# Patient Record
Sex: Female | Born: 1950 | Race: White | Hispanic: No | Marital: Married | Smoking: Never smoker
Health system: Southern US, Community
[De-identification: ages and names within clinical notes are randomized; demographics above are authoritative.]

## PROBLEM LIST (undated history)

## (undated) DIAGNOSIS — K635 Polyp of colon: Secondary | ICD-10-CM

## (undated) DIAGNOSIS — H269 Unspecified cataract: Secondary | ICD-10-CM

## (undated) DIAGNOSIS — Z800 Family history of malignant neoplasm of digestive organs: Secondary | ICD-10-CM

## (undated) DIAGNOSIS — F32A Depression, unspecified: Secondary | ICD-10-CM

## (undated) DIAGNOSIS — N189 Chronic kidney disease, unspecified: Secondary | ICD-10-CM

## (undated) DIAGNOSIS — N952 Postmenopausal atrophic vaginitis: Secondary | ICD-10-CM

## (undated) DIAGNOSIS — M1990 Unspecified osteoarthritis, unspecified site: Secondary | ICD-10-CM

## (undated) DIAGNOSIS — E785 Hyperlipidemia, unspecified: Secondary | ICD-10-CM

## (undated) DIAGNOSIS — Z803 Family history of malignant neoplasm of breast: Secondary | ICD-10-CM

## (undated) DIAGNOSIS — R32 Unspecified urinary incontinence: Secondary | ICD-10-CM

## (undated) DIAGNOSIS — G4700 Insomnia, unspecified: Secondary | ICD-10-CM

## (undated) DIAGNOSIS — Z807 Family history of other malignant neoplasms of lymphoid, hematopoietic and related tissues: Secondary | ICD-10-CM

## (undated) DIAGNOSIS — F419 Anxiety disorder, unspecified: Secondary | ICD-10-CM

## (undated) DIAGNOSIS — R3129 Other microscopic hematuria: Secondary | ICD-10-CM

## (undated) DIAGNOSIS — Z8051 Family history of malignant neoplasm of kidney: Secondary | ICD-10-CM

## (undated) DIAGNOSIS — F329 Major depressive disorder, single episode, unspecified: Secondary | ICD-10-CM

## (undated) DIAGNOSIS — Z8042 Family history of malignant neoplasm of prostate: Secondary | ICD-10-CM

## (undated) DIAGNOSIS — K219 Gastro-esophageal reflux disease without esophagitis: Secondary | ICD-10-CM

## (undated) DIAGNOSIS — R112 Nausea with vomiting, unspecified: Secondary | ICD-10-CM

## (undated) DIAGNOSIS — D125 Benign neoplasm of sigmoid colon: Secondary | ICD-10-CM

## (undated) DIAGNOSIS — K52832 Lymphocytic colitis: Secondary | ICD-10-CM

## (undated) DIAGNOSIS — Z9889 Other specified postprocedural states: Secondary | ICD-10-CM

## (undated) DIAGNOSIS — K5730 Diverticulosis of large intestine without perforation or abscess without bleeding: Secondary | ICD-10-CM

## (undated) HISTORY — DX: Family history of other malignant neoplasms of lymphoid, hematopoietic and related tissues: Z80.7

## (undated) HISTORY — DX: Diverticulosis of large intestine without perforation or abscess without bleeding: K57.30

## (undated) HISTORY — PX: EYE SURGERY: SHX253

## (undated) HISTORY — DX: Anxiety disorder, unspecified: F41.9

## (undated) HISTORY — DX: Family history of malignant neoplasm of breast: Z80.3

## (undated) HISTORY — DX: Unspecified urinary incontinence: R32

## (undated) HISTORY — DX: Family history of malignant neoplasm of kidney: Z80.51

## (undated) HISTORY — DX: Family history of malignant neoplasm of prostate: Z80.42

## (undated) HISTORY — DX: Unspecified cataract: H26.9

## (undated) HISTORY — DX: Depression, unspecified: F32.A

## (undated) HISTORY — DX: Major depressive disorder, single episode, unspecified: F32.9

## (undated) HISTORY — DX: Gastro-esophageal reflux disease without esophagitis: K21.9

## (undated) HISTORY — DX: Polyp of colon: K63.5

## (undated) HISTORY — DX: Postmenopausal atrophic vaginitis: N95.2

## (undated) HISTORY — DX: Hyperlipidemia, unspecified: E78.5

## (undated) HISTORY — DX: Family history of malignant neoplasm of digestive organs: Z80.0

## (undated) HISTORY — DX: Benign neoplasm of sigmoid colon: D12.5

## (undated) HISTORY — DX: Unspecified osteoarthritis, unspecified site: M19.90

## (undated) HISTORY — DX: Other microscopic hematuria: R31.29

## (undated) HISTORY — DX: Insomnia, unspecified: G47.00

## (undated) HISTORY — DX: Chronic kidney disease, unspecified: N18.9

## (undated) HISTORY — DX: Lymphocytic colitis: K52.832

---

## 1978-07-06 HISTORY — PX: TUBAL LIGATION: SHX77

## 1991-07-07 HISTORY — PX: VAGINAL HYSTERECTOMY: SUR661

## 1999-09-04 ENCOUNTER — Encounter: Payer: Self-pay | Admitting: Internal Medicine

## 1999-09-04 ENCOUNTER — Encounter: Admission: RE | Admit: 1999-09-04 | Discharge: 1999-09-04 | Payer: Self-pay | Admitting: Internal Medicine

## 2000-09-06 ENCOUNTER — Encounter: Admission: RE | Admit: 2000-09-06 | Discharge: 2000-09-06 | Payer: Self-pay | Admitting: Internal Medicine

## 2000-09-06 ENCOUNTER — Encounter: Payer: Self-pay | Admitting: Internal Medicine

## 2001-09-26 ENCOUNTER — Encounter: Payer: Self-pay | Admitting: Unknown Physician Specialty

## 2001-09-26 ENCOUNTER — Encounter: Admission: RE | Admit: 2001-09-26 | Discharge: 2001-09-26 | Payer: Self-pay | Admitting: Unknown Physician Specialty

## 2003-07-07 HISTORY — PX: SHOULDER SURGERY: SHX246

## 2005-07-06 HISTORY — PX: OTHER SURGICAL HISTORY: SHX169

## 2009-08-27 HISTORY — PX: URETHRAL SLING: SHX2621

## 2011-09-24 LAB — HM PAP SMEAR

## 2012-04-27 LAB — LIPID PANEL
CHOLESTEROL, TOTAL: 224
HDL: 58 mg/dL (ref 35–70)
LDL (calc): 143
TRIGLYCERIDES: 114

## 2012-04-27 LAB — CBC
HEMOGLOBIN: 12.7 g/dL
WBC: 5.2
platelet count: 294

## 2012-04-27 LAB — COMPREHENSIVE METABOLIC PANEL
ALK PHOS: 87 U/L
ALT: 20 U/L (ref 7–35)
AST: 19 U/L
Creat: 0.93
Glucose: 89
Potassium: 4.4 mmol/L
Sodium: 143 mmol/L (ref 137–147)
Total Bilirubin: 0.4 mg/dL

## 2012-04-27 LAB — TSH: TSH: 2.76

## 2012-05-06 HISTORY — PX: OTHER SURGICAL HISTORY: SHX169

## 2013-05-15 ENCOUNTER — Telehealth: Payer: Self-pay

## 2013-05-15 NOTE — Telephone Encounter (Signed)
Pt is scheduled to see Dr Reece Agar on 06/07/13 as new pt to establish; pt wants to know if can get mammogram and labs ordered prior to appt; pt will come to appt fasting and pt is not sure if gets screening or diag mammo and will discuss with Dr Reece Agar at appt.

## 2013-05-17 ENCOUNTER — Other Ambulatory Visit: Payer: Self-pay

## 2013-05-17 DIAGNOSIS — Z1231 Encounter for screening mammogram for malignant neoplasm of breast: Secondary | ICD-10-CM

## 2013-05-31 ENCOUNTER — Telehealth: Payer: Self-pay

## 2013-05-31 NOTE — Telephone Encounter (Signed)
Rec'd from [facility] forward 15 pages to Historical Provider MD GI

## 2013-06-05 DIAGNOSIS — R3129 Other microscopic hematuria: Secondary | ICD-10-CM

## 2013-06-05 HISTORY — DX: Other microscopic hematuria: R31.29

## 2013-06-05 HISTORY — PX: COLONOSCOPY: SHX174

## 2013-06-07 ENCOUNTER — Encounter: Payer: Self-pay | Admitting: Family Medicine

## 2013-06-07 ENCOUNTER — Ambulatory Visit (INDEPENDENT_AMBULATORY_CARE_PROVIDER_SITE_OTHER): Payer: 59 | Admitting: Family Medicine

## 2013-06-07 VITALS — BP 124/82 | HR 85 | Temp 98.3°F | Ht 66.0 in | Wt 151.0 lb

## 2013-06-07 DIAGNOSIS — Z23 Encounter for immunization: Secondary | ICD-10-CM

## 2013-06-07 DIAGNOSIS — M810 Age-related osteoporosis without current pathological fracture: Secondary | ICD-10-CM

## 2013-06-07 DIAGNOSIS — M1990 Unspecified osteoarthritis, unspecified site: Secondary | ICD-10-CM

## 2013-06-07 DIAGNOSIS — Z8659 Personal history of other mental and behavioral disorders: Secondary | ICD-10-CM | POA: Insufficient documentation

## 2013-06-07 DIAGNOSIS — F329 Major depressive disorder, single episode, unspecified: Secondary | ICD-10-CM

## 2013-06-07 DIAGNOSIS — F5104 Psychophysiologic insomnia: Secondary | ICD-10-CM | POA: Insufficient documentation

## 2013-06-07 DIAGNOSIS — G4700 Insomnia, unspecified: Secondary | ICD-10-CM

## 2013-06-07 DIAGNOSIS — N951 Menopausal and female climacteric states: Secondary | ICD-10-CM

## 2013-06-07 DIAGNOSIS — E785 Hyperlipidemia, unspecified: Secondary | ICD-10-CM

## 2013-06-07 DIAGNOSIS — R634 Abnormal weight loss: Secondary | ICD-10-CM

## 2013-06-07 DIAGNOSIS — Z809 Family history of malignant neoplasm, unspecified: Secondary | ICD-10-CM

## 2013-06-07 DIAGNOSIS — F32A Depression, unspecified: Secondary | ICD-10-CM

## 2013-06-07 DIAGNOSIS — R3129 Other microscopic hematuria: Secondary | ICD-10-CM

## 2013-06-07 DIAGNOSIS — M129 Arthropathy, unspecified: Secondary | ICD-10-CM

## 2013-06-07 DIAGNOSIS — K52832 Lymphocytic colitis: Secondary | ICD-10-CM

## 2013-06-07 DIAGNOSIS — R197 Diarrhea, unspecified: Secondary | ICD-10-CM

## 2013-06-07 DIAGNOSIS — F418 Other specified anxiety disorders: Secondary | ICD-10-CM | POA: Insufficient documentation

## 2013-06-07 DIAGNOSIS — R319 Hematuria, unspecified: Secondary | ICD-10-CM

## 2013-06-07 DIAGNOSIS — F3289 Other specified depressive episodes: Secondary | ICD-10-CM

## 2013-06-07 DIAGNOSIS — R32 Unspecified urinary incontinence: Secondary | ICD-10-CM

## 2013-06-07 HISTORY — DX: Lymphocytic colitis: K52.832

## 2013-06-07 LAB — POCT URINALYSIS DIPSTICK
Bilirubin, UA: NEGATIVE
Glucose, UA: NEGATIVE
Leukocytes, UA: NEGATIVE
Spec Grav, UA: 1.02
Urobilinogen, UA: 0.2

## 2013-06-07 LAB — CBC WITH DIFFERENTIAL/PLATELET
Basophils Absolute: 0 10*3/uL (ref 0.0–0.1)
Basophils Relative: 0.4 % (ref 0.0–3.0)
Eosinophils Relative: 0.4 % (ref 0.0–5.0)
HCT: 35.6 % — ABNORMAL LOW (ref 36.0–46.0)
Hemoglobin: 12.1 g/dL (ref 12.0–15.0)
Lymphocytes Relative: 32 % (ref 12.0–46.0)
Lymphs Abs: 1.7 10*3/uL (ref 0.7–4.0)
Monocytes Relative: 8 % (ref 3.0–12.0)
Neutro Abs: 3.1 10*3/uL (ref 1.4–7.7)
Neutrophils Relative %: 59.2 % (ref 43.0–77.0)
RBC: 4.01 Mil/uL (ref 3.87–5.11)
WBC: 5.2 10*3/uL (ref 4.5–10.5)

## 2013-06-07 LAB — COMPREHENSIVE METABOLIC PANEL WITH GFR
ALT: 20 U/L (ref 0–35)
AST: 18 U/L (ref 0–37)
Albumin: 3.9 g/dL (ref 3.5–5.2)
Alkaline Phosphatase: 61 U/L (ref 39–117)
BUN: 10 mg/dL (ref 6–23)
CO2: 28 meq/L (ref 19–32)
Calcium: 9.1 mg/dL (ref 8.4–10.5)
Chloride: 105 meq/L (ref 96–112)
Creatinine, Ser: 0.9 mg/dL (ref 0.4–1.2)
GFR: 66.64 mL/min
Glucose, Bld: 84 mg/dL (ref 70–99)
Potassium: 3.4 meq/L — ABNORMAL LOW (ref 3.5–5.1)
Sodium: 140 meq/L (ref 135–145)
Total Bilirubin: 0.8 mg/dL (ref 0.3–1.2)
Total Protein: 7.4 g/dL (ref 6.0–8.3)

## 2013-06-07 LAB — TSH: TSH: 1.75 u[IU]/mL (ref 0.35–5.50)

## 2013-06-07 LAB — URINALYSIS, MICROSCOPIC ONLY

## 2013-06-07 LAB — LIPID PANEL
Cholesterol: 229 mg/dL — ABNORMAL HIGH (ref 0–200)
HDL: 42.8 mg/dL
Total CHOL/HDL Ratio: 5
Triglycerides: 144 mg/dL (ref 0.0–149.0)
VLDL: 28.8 mg/dL (ref 0.0–40.0)

## 2013-06-07 LAB — LDL CHOLESTEROL, DIRECT: Direct LDL: 162.1 mg/dL

## 2013-06-07 MED ORDER — TRAZODONE HCL 50 MG PO TABS: 50.0000 mg | ORAL_TABLET | Freq: Every day | ORAL | Status: DC

## 2013-06-07 NOTE — Patient Instructions (Addendum)
Try to get me record of bone density scan to update your chart. Our fax # is [phone] Try trazodone 1/2 tab at night time for sleep.  If helpful, may slowly taper off lorazepam use.  May also stop melatonin. Blood work today - urine check today and we will call you with results. Return at your convenience in next few months for physical. Good to meet you today! Call us with quesitons. Flu shot today

## 2013-06-07 NOTE — Assessment & Plan Note (Signed)
Currently not sexually active. Discussed options including SERM, estrogen topical and oral and vaginal lubricants/moisturizers. Pt hesitant for any hormonal therapy given strong fmhx cancers.  Will monitor off meds for now, further discuss osphena in future.

## 2013-06-07 NOTE — Assessment & Plan Note (Signed)
Longstanding h/o depression, but also with superimposed significant family health problems. Will continue to monitor for now as pt does not currently desire pharmacotherapy

## 2013-06-07 NOTE — Assessment & Plan Note (Addendum)
See above - check CMP, CBC and TSH

## 2013-06-07 NOTE — Assessment & Plan Note (Signed)
Osteoporosis per prior PCP records but pt unsure. Will await records of last dexa scan. Pt currently not on calcium or vit D.

## 2013-06-07 NOTE — Assessment & Plan Note (Addendum)
Stable on celebrex, trying to minimize use

## 2013-06-07 NOTE — Assessment & Plan Note (Signed)
On melatonin and lorazepam 2mg  nightly, losing effectiveness Will do trial of trazodone 25-50mg  nightly.

## 2013-06-07 NOTE — Assessment & Plan Note (Addendum)
Several month history of progressively worsening diarrhea but pt endorses recently normal colonoscopy.  Also noted weight loss (in setting of recent move) Pt appropriately concerned given fmhx. Pt has appt already scheduled with GI. Will check CMP, CBC and TSH today to start evaluation.

## 2013-06-07 NOTE — Progress Notes (Signed)
Subjective:    Patient ID: Shannon Kelly, female    DOB: Feb 17, 1951, 62 y.o.   MRN: [id number]  HPI CC: new pt to establish  I did receive records from Dr. Dellia Beckwith but no reports of colonoscopy or dexa scan.  Arthritis - knee pain and thoracic back pain.  Takes celebrex prn.  Tries to minimize use. Significant vaginal dryness - hesitant to use any estrogen or hormonal products.  Asks about osphena.  Has tried vaginal lubricants and moisturizers.  Leads to significant dyspareunia and sexual dysfunction.  Diarrhea - has had chronic diarrhea over last several months.  Had seen GI - s/p colonoscopy ~2013.  Pt increased fiber in diet, this has not helped.  Over last 3 weeks, increasing frequency of diarrhea.  Has appt with GI tomorrow to evaluate this. Mother with h/o stage 4 colon cancer at age 17, sudden death. Brother with recent dx pancreatic cancer. Strong fmhx cancer, wonders about genetic test.  Osteoporosis - dexa scan done last few years per patient. Depression - significant family stress recently.  does not want med for this.  Prior on cymbalta but did not respond well to this.  States has tried 15 different medications. OBGYN recommended sleep study.  Pt endorses snoring, no apnea however.  Does feel rested when she awakens.  No daytime somnolence.  Some left lateral back pain worse with physical labor.  No radiation.  Dull throbbing ache. Has lost 12 lbs in last year without trying but has been more active with move.  Significant fmhx cancers - see [facility].  Requests UA checked today.  Has been told has trace blood in urine in past.  Preventative: Due for CPE Colonoscopy - done 2013 per patient [facility] - with OBGYN.  S/p hysterectomy. Flu shot today  Medications and allergies reviewed and updated in chart.  Past histories reviewed and updated if relevant as below. There are no active problems to display for this patient.  Past Medical History  Diagnosis Date  .  Arthritis   . HLD (hyperlipidemia)   . Osteoporosis   . Depression   . Urine incontinence    Past Surgical History  Procedure Laterality Date  . Vaginal hysterectomy  1993    fibroids  . Shoulder surgery Right 2005    Calcium deposit in tendon  . Urethral sling  08/27/2009  . Tubal ligation  1980   History  Substance Use Topics  . Smoking status: Never Smoker   . Smokeless tobacco: Never Used  . Alcohol Use: No   Family History  Problem Relation Age of Onset  . Cancer Mother 30    colon, lymphoma  . Cancer Brother 46    RCC stage 4  . Cancer Brother 92    pancreatic  . Cancer Cousin 48    lung  . Cancer Cousin     breast  . Cancer Maternal Uncle     stomach  . Cancer Maternal Uncle     prostate  . Cancer Cousin     NHL  . Diabetes Brother   . Diabetes Father   . CAD Maternal Uncle   . Stroke Neg Hx    No Known Allergies No current outpatient prescriptions on file prior to visit.   No current facility-administered medications on file prior to visit.    Review of Systems Per HPI    Objective:   Physical Exam  Nursing note and vitals reviewed. Constitutional: She is oriented to person, place, and  time. She appears well-developed and well-nourished. No distress.  HENT:  Head: Normocephalic and atraumatic.  Mouth/Throat: Oropharynx is clear and moist. No oropharyngeal exudate.  Eyes: Conjunctivae and EOM are normal. Pupils are equal, round, and reactive to light. No scleral icterus.  Neck: Normal range of motion. Neck supple. No thyromegaly present.  Cardiovascular: Normal rate, regular rhythm, normal heart sounds and intact distal pulses.   No murmur heard. Pulses:      Radial pulses are 2+ on the right side, and 2+ on the left side.  Pulmonary/Chest: Effort normal and breath sounds normal. No respiratory distress. She has no wheezes. She has no rales.  Abdominal: Soft. Normal appearance and bowel sounds are normal. She exhibits no distension and no mass.  There is no hepatosplenomegaly. There is no tenderness. There is no rigidity, no rebound, no guarding, no CVA tenderness and negative Murphy's sign.  No reproducible tenderness to bilateral flanks.  Musculoskeletal: Normal range of motion. She exhibits no edema.  Lymphadenopathy:    She has no cervical adenopathy.  Neurological: She is alert and oriented to person, place, and time.  CN grossly intact, station and gait intact  Skin: Skin is warm and dry. No rash noted.  Psychiatric: She has a normal mood and affect. Her behavior is normal. Judgment and thought content normal.       Assessment & Plan:

## 2013-06-07 NOTE — Progress Notes (Signed)
Pre-visit discussion using our clinic review tool. No additional management support is needed unless otherwise documented below in the visit note.  

## 2013-06-07 NOTE — Assessment & Plan Note (Signed)
Mildly elevated, off meds. Recheck today as fasting.

## 2013-06-08 ENCOUNTER — Encounter: Payer: Self-pay | Admitting: Internal Medicine

## 2013-06-08 ENCOUNTER — Ambulatory Visit (INDEPENDENT_AMBULATORY_CARE_PROVIDER_SITE_OTHER): Payer: 59 | Admitting: Internal Medicine

## 2013-06-08 VITALS — BP 136/80 | HR 60 | Ht 66.0 in | Wt 152.8 lb

## 2013-06-08 DIAGNOSIS — R197 Diarrhea, unspecified: Secondary | ICD-10-CM

## 2013-06-08 MED ORDER — NA SULFATE-K SULFATE-MG SULF 17.5-3.13-1.6 GM/177ML PO SOLN: ORAL | Status: DC

## 2013-06-08 NOTE — Patient Instructions (Addendum)
You have been scheduled for a colonoscopy with propofol. Please follow written instructions given to you at your visit today.  Please pick up your prep kit at the pharmacy within the next 1-3 days. If you use inhalers (even only as needed), please bring them with you on the day of your procedure. Your physician has requested that you go to www.startemmi.com and enter the access code given to you at your visit today. This web site gives a general overview about your procedure. However, you should still follow specific instructions given to you by our office regarding your preparation for the procedure.  Your physician has requested that you go to the basement for the following lab work before leaving today: Stool studies, you may drop these off at [facility].  You may use the Immodium as needed for the diarrhea.  I appreciate the opportunity to care for you.

## 2013-06-08 NOTE — Assessment & Plan Note (Signed)
This is now chronic I would say. Causes not clear. I asked she has not had travel or recent antibiotics but C. differential is in the differential diagnosis as is other infections. We'll have her do stool for fecal leukocytes, ova and parasite screen, C. differential PCR and culture. Will also schedule her for a colonoscopy. If these tests come back positive will cancel the colonoscopy.The risks and benefits as well as alternatives of endoscopic procedure(s) have been discussed and reviewed. All questions answered. The patient agrees to proceed.

## 2013-06-08 NOTE — Progress Notes (Signed)
Subjective:    Patient ID: Shannon Kelly, female    DOB: 07/29/1950, 62 y.o.   MRN: [id number]  HPI The patient is a very pleasant married 62 year old white woman who self referred herself because of diarrhea problems over the past month. She says in the last year or so she started having soft stools. That in the past 4-5 weeks she started having loose watery stools that are urgent and she's had one episode of verge incontinence. They bother her while she is trying to sleep. Loperamide does provide some relief. There is no abdominal pain. She's had a left flank pain that bothers her when she's doing something like mowing the ER and was sitting against a church P2 for several months, it is not related to defecation or eating. She moved here from [place], just in the last month or so. She has regular colonoscopy procedures every 5 years because her mother had colon cancer and died soon after, it was diagnosed at 24, she also had a lymphoma diagnosed at the time of her operation. She has been under stress because her brother was diagnosed with metastatic pancreatic cancer this summer. She does not think her symptoms are really related to that. Intake or whether or not she eats. She has not had fever or bleeding. she has lost about 10 pounds. No Known Allergies Outpatient Prescriptions Prior to Visit  Medication Sig Dispense Refill  . celecoxib (CELEBREX) 200 MG capsule Take 200 mg by mouth daily as needed.       Marland Kitchen LORazepam (ATIVAN) 1 MG tablet Take 1 mg by mouth at bedtime.       . Melatonin 10 MG TABS Take 1 tablet by mouth at bedtime.      . traZODone (DESYREL) 50 MG tablet Take 1 tablet (50 mg total) by mouth at bedtime.  30 tablet  3  . loperamide (IMODIUM) 2 MG capsule Take 1 mg by mouth as needed for diarrhea or loose stools.      . Olopatadine HCl (PATADAY) 0.2 % SOLN Apply to eye as needed.       No facility-administered medications prior to visit.   Past Medical History    Diagnosis Date  . Arthritis   . HLD (hyperlipidemia)   . Osteoporosis   . Depression   . Urine incontinence   . Polyp, sigmoid colon     hyperplastic  . Diverticula of colon     sigmoid   Past Surgical History  Procedure Laterality Date  . Vaginal hysterectomy  1993    fibroids  . Shoulder surgery Right 2005    Calcium deposit in tendon  . Urethral sling  08/27/2009  . Tubal ligation  1980  . Colonoscopy w/ polypectomy  2013  . Colonoscopy  2008  . 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 Marital Status: Married    Spouse Name: N/A    Number of Children: 1  . Years of Education: N/A   Occupational History  . Retired    Social History Main Topics  . Smoking status: Never Smoker   . Smokeless tobacco: Never Used  . Alcohol Use: No     Comment: occasional  . Drug Use: No  . Sexual Activity: None   Other Topics Concern  . None   Social History Narrative   Married, 1 son  Had been living in [place], return to [facility] fall 2014   Occupation: Retired, prior worked at [employer]   Edu: HS   Family History  Problem Relation Age of Onset  . Cancer Mother 80    colon, lymphoma  . Cancer Brother 31    RCC stage 4  . Cancer Brother 70    pancreatic  . Cancer Cousin 48    lung  . Cancer Cousin     breast  . Cancer Maternal Uncle     stomach  . Cancer Maternal Uncle     prostate  . Cancer Cousin     NHL  . Diabetes Brother   . Diabetes Father   . CAD Maternal Uncle   . Stroke Neg Hx     Review of Systems This is positive for those things mentiones in the HPI, also positive for insomnia, excessive thirst and dry mouth. She has cataracts and has surgery planned for 07/12/2013. She's had some depressed mood, fatigue back pain joint pain and some anxiety symptoms.. All other review of systems are negative.      Objective:   Physical Exam General:  Well-developed,  well-nourished and in no acute distress Eyes:  anicteric. ENT:   Mouth and posterior pharynx free of lesions.  Neck:   supple w/o thyromegaly or mass.  Lungs: Clear to auscultation bilaterally. Heart:  S1S2, no rubs, murmurs, gallops. Abdomen:  soft, non-tender, no hepatosplenomegaly, hernia, or mass and BS+.  Rectal: deferred Lymph:  no cervical or supraclavicular adenopathy. Extremities:   no edema Skin   no rash. Neuro:  A&O x 3.  Psych:  appropriate mood and  Affect.   Data Reviewed: Previous colonoscopies Lab Results  Component Value Date   WBC 5.2 06/07/2013   HGB 12.1 06/07/2013   HCT 35.6* 06/07/2013   MCV 88.6 06/07/2013   PLT 296.0 06/07/2013     Chemistry      Component Value Date/Time   NA 140 06/07/2013 1051   K 3.4* 06/07/2013 1051   CL 105 06/07/2013 1051   CO2 28 06/07/2013 1051   BUN 10 06/07/2013 1051   CREATININE 0.9 06/07/2013 1051      Component Value Date/Time   CALCIUM 9.1 06/07/2013 1051   ALKPHOS 61 06/07/2013 1051   AST 18 06/07/2013 1051   ALT 20 06/07/2013 1051   BILITOT 0.8 06/07/2013 1051     Lab Results  Component Value Date   TSH 1.75 06/07/2013      Assessment & Plan:   1. Diarrhea

## 2013-06-08 NOTE — Addendum Note (Signed)
Addended by: Iva Boop on: 06/08/2013 04:37 PM   Modules accepted: Level of Service

## 2013-06-09 NOTE — Addendum Note (Signed)
Addended by: Alvina Chou on: 06/09/2013 08:39 AM   Modules accepted: Orders

## 2013-06-11 DIAGNOSIS — R3129 Other microscopic hematuria: Secondary | ICD-10-CM | POA: Insufficient documentation

## 2013-06-11 NOTE — Assessment & Plan Note (Signed)
UA today mod blood, but micro with 0-3 RBC - sent micro to lab to verify. 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  ADDENDUM ==> micro with 3-6  RBC/hpf - will discuss referral to urology

## 2013-06-12 ENCOUNTER — Other Ambulatory Visit: Payer: Self-pay | Admitting: Family Medicine

## 2013-06-12 ENCOUNTER — Telehealth: Payer: Self-pay

## 2013-06-12 ENCOUNTER — Encounter: Payer: Self-pay | Admitting: Family Medicine

## 2013-06-12 DIAGNOSIS — R3129 Other microscopic hematuria: Secondary | ICD-10-CM

## 2013-06-12 DIAGNOSIS — R634 Abnormal weight loss: Secondary | ICD-10-CM

## 2013-06-12 LAB — OVA AND PARASITE SCREEN: OP: NONE SEEN

## 2013-06-12 LAB — CLOSTRIDIUM DIFFICILE BY PCR: Toxigenic C. Difficile by PCR: NOT DETECTED

## 2013-06-12 NOTE — Telephone Encounter (Signed)
Told patient that C-Diff test is still pending, we should get a called report shortly after 2pm according to Viviann Spare at [facility] and I will call her back on her mobile # as she will be out.  She informed me of a new rx her PCP has put her on for sleep (trazodone) that I will add to her list.  She thinks it may be constipating her.

## 2013-06-12 NOTE — Telephone Encounter (Signed)
Per Rudell Cobb at [facility] patients C-Diff test is Negative.  Patient informed of the results and will prep tonight for her colonoscopy.

## 2013-06-13 ENCOUNTER — Encounter: Payer: Self-pay | Admitting: Internal Medicine

## 2013-06-13 ENCOUNTER — Ambulatory Visit (AMBULATORY_SURGERY_CENTER): Payer: 59 | Admitting: Internal Medicine

## 2013-06-13 VITALS — BP 121/72 | HR 63 | Temp 96.5°F | Resp 13 | Ht 66.0 in | Wt 152.0 lb

## 2013-06-13 DIAGNOSIS — R197 Diarrhea, unspecified: Secondary | ICD-10-CM

## 2013-06-13 DIAGNOSIS — K5289 Other specified noninfective gastroenteritis and colitis: Secondary | ICD-10-CM

## 2013-06-13 LAB — STOOL CULTURE

## 2013-06-13 MED ORDER — SODIUM CHLORIDE 0.9 % IV SOLN: 500.0000 mL | INTRAVENOUS | Status: DC

## 2013-06-13 NOTE — Progress Notes (Signed)
Lidocaine-40mg IV prior to Propofol InductionPropofol given over incremental dosages 

## 2013-06-13 NOTE — Op Note (Signed)
[facility] [address] [place], [address]   COLONOSCOPY PROCEDURE REPORT  PATIENT: Shannon, Kelly  MR#: [id number] BIRTHDATE: Nov 27, 1950 , 62  yrs. old GENDER: Female ENDOSCOPIST: Iva Boop, MD, [facility] PROCEDURE DATE:  06/13/2013 PROCEDURE:   Colonoscopy with biopsy First Screening Colonoscopy - Avg.  risk and is 50 yrs.  old or older - No.  Prior Negative Screening - Now for repeat screening. N/A  History of Adenoma - Now for follow-up colonoscopy & has been > or = to 3 yrs.  N/A  Polyps Removed Today? No.  Recommend repeat exam, <10 yrs? Yes.  High risk (family or personal hx). ASA CLASS:   Class II INDICATIONS:unexplained diarrhea. MEDICATIONS: propofol (Diprivan) 150mg  IV, MAC sedation, administered by CRNA, and These medications were titrated to patient response per physician's verbal order  DESCRIPTION OF PROCEDURE:   After the risks benefits and alternatives of the procedure were thoroughly explained, informed consent was obtained.  A digital rectal exam revealed no abnormalities of the rectum.   The LB [id number]  endoscope was introduced through the anus and advanced to the cecum, which was identified by both the appendix and ileocecal valve. No adverse events experienced.   The quality of the prep was excellent using Suprep  The instrument was then slowly withdrawn as the colon was fully examined.   COLON FINDINGS: The mucosa appeared normal in the terminal ileum. A normal appearing cecum, ileocecal valve, and appendiceal orifice were identified.  The ascending, hepatic flexure, transverse, splenic flexure, descending, sigmoid colon and rectum appeared unremarkable.  No polyps or cancers were seen.  Multiple random biopsies of the area were performed.   A right colon retroflexion was performed.  Retroflexed views revealed no abnormalities. The time to cecum=1 minutes 55 seconds.  Withdrawal time=9 minutes 0 seconds.  The scope was  withdrawn and the procedure completed. COMPLICATIONS: There were no complications.  ENDOSCOPIC IMPRESSION: 1.   Normal mucosa in the terminal ileum 2.   Normal colon; multiple random biopsies of the area were performed - will call results  RECOMMENDATIONS: 1.  Repeat Colonoscopy in 5 years. 2.   family history of colon cancer   eSigned:  Iva Boop, MD, [facility] 06/13/2013 3:03 PM  cc: The Patient and Eustaquio Boyden MD

## 2013-06-13 NOTE — Patient Instructions (Addendum)
I did not see any problems but did take colon biopsies to look for microscopic changes in the colon lining that could tell us why you have diarrhea.  I suggest trying a 1/4 tablet of the trazodone if 1/2 tablet caused constipation.  I appreciate the opportunity to care for you. Iva Boop, MD, [facility]   You may resume your other current medications today. Please call if any questions or concerns.    YOU HAD AN ENDOSCOPIC PROCEDURE TODAY AT THE [facility]: Refer to the procedure report that was given to you for any specific questions about what was found during the examination.  If the procedure report does not answer your questions, please call your gastroenterologist to clarify.  If you requested that your care partner not be given the details of your procedure findings, then the procedure report has been included in a sealed envelope for you to review at your convenience later.  YOU SHOULD EXPECT: Some feelings of bloating in the abdomen. Passage of more gas than usual.  Walking can help get rid of the air that was put into your GI tract during the procedure and reduce the bloating. If you had a lower endoscopy (such as a colonoscopy or flexible sigmoidoscopy) you may notice spotting of blood in your stool or on the toilet paper. If you underwent a bowel prep for your procedure, then you may not have a normal bowel movement for a few days.  DIET: Your first meal following the procedure should be a light meal and then it is ok to progress to your normal diet.  A half-sandwich or bowl of soup is an example of a good first meal.  Heavy or fried foods are harder to digest and may make you feel nauseous or bloated.  Likewise meals heavy in dairy and vegetables can cause extra gas to form and this can also increase the bloating.  Drink plenty of fluids but you should avoid alcoholic beverages for 24 hours.  ACTIVITY: Your care partner should take you home directly after the procedure.   You should plan to take it easy, moving slowly for the rest of the day.  You can resume normal activity the day after the procedure however you should NOT DRIVE or use heavy machinery for 24 hours (because of the sedation medicines used during the test).    SYMPTOMS TO REPORT IMMEDIATELY: A gastroenterologist can be reached at any hour.  During normal business hours, 8:30 AM to 5:00 PM Monday through Friday, call [phone].  After hours and on weekends, please call the GI answering service at [phone] who will take a message and have the physician on call contact you.   Following lower endoscopy (colonoscopy or flexible sigmoidoscopy):  Excessive amounts of blood in the stool  Significant tenderness or worsening of abdominal pains  Swelling of the abdomen that is new, acute  Fever of 100F or higher    FOLLOW UP: If any biopsies were taken you will be contacted by phone or by letter within the next 1-3 weeks.  Call your gastroenterologist if you have not heard about the biopsies in 3 weeks.  Our staff will call the home number listed on your records the next business day following your procedure to check on you and address any questions or concerns that you may have at that time regarding the information given to you following your procedure. This is a courtesy call and so if there is no answer at the  home number and we have not heard from you through the emergency physician on call, we will assume that you have returned to your regular daily activities without incident.  SIGNATURES/CONFIDENTIALITY: You and/or your care partner have signed paperwork which will be entered into your electronic medical record.  These signatures attest to the fact that that the information above on your After Visit Summary has been reviewed and is understood.  Full responsibility of the confidentiality of this discharge information lies with you and/or your care-partner.

## 2013-06-13 NOTE — Progress Notes (Signed)
Called to room to assist during endoscopic procedure.  Patient ID and intended procedure confirmed with present staff. Received instructions for my participation in the procedure from the performing physician.  

## 2013-06-13 NOTE — Progress Notes (Signed)
No complaints noted in the recovery room. Maw   

## 2013-06-14 ENCOUNTER — Telehealth: Payer: Self-pay

## 2013-06-14 NOTE — Telephone Encounter (Signed)
  Follow up Call-  Call back number 06/13/2013  Post procedure Call Back phone  # [phone]-HOME/ CELL [phone]  Permission to leave phone message Yes     Patient questions:  Do you have a fever, pain , or abdominal swelling? no Pain Score  0 *  Have you tolerated food without any problems? yes  Have you been able to return to your normal activities? yes  Do you have any questions about your discharge instructions: Diet   no Medications  no Follow up visit  no  Do you have questions or concerns about your Care? no  Actions: * If pain score is 4 or above: No action needed, pain <4.

## 2013-06-19 ENCOUNTER — Ambulatory Visit
Admission: RE | Admit: 2013-06-19 | Discharge: 2013-06-19 | Disposition: A | Discharge status: Home / Self Care | Payer: 59 | Source: Ambulatory Visit

## 2013-06-19 ENCOUNTER — Ambulatory Visit (INDEPENDENT_AMBULATORY_CARE_PROVIDER_SITE_OTHER)
Admission: RE | Admit: 2013-06-19 | Discharge: 2013-06-19 | Disposition: A | Discharge status: Home / Self Care | Payer: 59 | Source: Ambulatory Visit | Attending: Family Medicine | Admitting: Family Medicine

## 2013-06-19 DIAGNOSIS — R634 Abnormal weight loss: Secondary | ICD-10-CM

## 2013-06-19 DIAGNOSIS — Z1231 Encounter for screening mammogram for malignant neoplasm of breast: Secondary | ICD-10-CM

## 2013-06-19 DIAGNOSIS — R3129 Other microscopic hematuria: Secondary | ICD-10-CM

## 2013-06-19 MED ORDER — IOHEXOL 350 MG/ML SOLN
125.0000 mL | Freq: Once | INTRAVENOUS | Status: AC | PRN
  Administered 2013-06-19: 125 mL via INTRAVENOUS

## 2013-06-20 ENCOUNTER — Encounter: Payer: Self-pay | Admitting: Internal Medicine

## 2013-06-20 LAB — HM MAMMOGRAPHY: HM Mammogram: NORMAL

## 2013-06-20 NOTE — Progress Notes (Signed)
Quick Note:  Please let her know that she has a microscopic colitis and needs treatment to stop the sxs I recommend budesonide (Entocort EC) 9 mg daily - 3 mg capsules #90 1 refill Please have her see me in 4-6 weeks  If the Entocort EC is too expensive then I will rx prednisone instead  No letter from [facility] but needs 06/2018 colonoscopy recall ______

## 2013-06-21 ENCOUNTER — Encounter: Payer: Self-pay | Admitting: Family Medicine

## 2013-06-21 ENCOUNTER — Other Ambulatory Visit: Payer: Self-pay

## 2013-06-21 MED ORDER — BUDESONIDE 3 MG PO CP24: 9.0000 mg | ORAL_CAPSULE | Freq: Every day | ORAL | Status: DC

## 2013-06-21 NOTE — Addendum Note (Signed)
Addended by: Josph Macho A on: 06/21/2013 03:49 PM   Modules accepted: Orders

## 2013-06-22 ENCOUNTER — Encounter: Payer: Self-pay | Admitting: *Deleted

## 2013-06-22 ENCOUNTER — Encounter: Payer: Self-pay | Admitting: Family Medicine

## 2013-06-23 ENCOUNTER — Encounter: Payer: Self-pay | Admitting: Family Medicine

## 2013-07-11 ENCOUNTER — Encounter: Payer: Self-pay | Admitting: *Deleted

## 2013-07-12 ENCOUNTER — Ambulatory Visit: Payer: Self-pay | Admitting: Ophthalmology

## 2013-07-12 HISTORY — PX: CATARACT EXTRACTION W/ INTRAOCULAR LENS IMPLANT: SHX1309

## 2013-07-16 ENCOUNTER — Encounter: Payer: Self-pay | Admitting: Family Medicine

## 2013-08-01 ENCOUNTER — Ambulatory Visit (INDEPENDENT_AMBULATORY_CARE_PROVIDER_SITE_OTHER): Payer: 59 | Admitting: Internal Medicine

## 2013-08-01 ENCOUNTER — Encounter: Payer: Self-pay | Admitting: Internal Medicine

## 2013-08-01 ENCOUNTER — Other Ambulatory Visit (INDEPENDENT_AMBULATORY_CARE_PROVIDER_SITE_OTHER): Payer: 59

## 2013-08-01 VITALS — BP 120/66 | HR 70 | Ht 66.0 in | Wt 151.2 lb

## 2013-08-01 DIAGNOSIS — K52832 Lymphocytic colitis: Secondary | ICD-10-CM

## 2013-08-01 DIAGNOSIS — R197 Diarrhea, unspecified: Secondary | ICD-10-CM

## 2013-08-01 DIAGNOSIS — K5289 Other specified noninfective gastroenteritis and colitis: Secondary | ICD-10-CM

## 2013-08-01 LAB — IGA: IgA: 193 mg/dL (ref 68–378)

## 2013-08-01 MED ORDER — LOPERAMIDE HCL 2 MG PO TABS: 2.0000 mg | ORAL_TABLET | Freq: Four times a day (QID) | ORAL | Status: DC | PRN

## 2013-08-01 NOTE — Progress Notes (Signed)
         Subjective:    Patient ID: Shannon Kelly, female    DOB: 1951/06/06, 63 y.o.   MRN: [id number]  HPI The patient is here for followup after she was diagnosed with lymphocytic colitis at colonoscopy in December. She started budesonide 9 mg daily on December 17. She says in December she noted marked improvement had normal bowel movements. After the holidays she started having soft stools again today she's had 3 episodes of very loose diarrheal stools. She is not describing abdominal pain or other problems. She feels like her stress level is somewhat better.  Medications, allergies, past medical history, past surgical history, family history and social history are reviewed and updated in the EMR.   Review of Systems As per history of present illness, she also had right cataract extraction she's actually tapering off some steroid eyedrops, she was on an analgesic eyedrops and an antibiotic eyedrops which she has finished.    Objective:   Physical Exam Well-developed well-nourished no acute distress  Wt Readings from Last 3 Encounters:  08/01/13 151 lb 3.2 oz (68.584 kg)  06/13/13 152 lb (68.947 kg)  06/08/13 152 lb 12.8 oz (69.31 kg)      Assessment & Plan:   1. Lymphocytic colitis   2. Diarrhea

## 2013-08-01 NOTE — Patient Instructions (Signed)
Your physician has requested that you go to the basement for the following lab work before leaving today: TTG, IGA  We will be in touch with results and plans.   Use Imodium as needed.    I appreciate the opportunity to care for you.

## 2013-08-01 NOTE — Assessment & Plan Note (Signed)
She initially seemed to respond to budesonide or Entocort. Now she is slightly back towards her original symptoms. 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 It also could indicate she is at higher risk of celiac disease. I'm going to check a tissue transglutaminase antibody and IgA level. Once that returned lavage on other treatment, she will continue Entocort EC 9 mg daily for now. I advised her not to do any more prescriptions, she does not have any refills.  Think the possibilities are this is not lymphocytic colitis, versus it been refractory period await the tissue transglutaminase antibody and IgA level. Consider something like cholestyramine or colestipol therapy. In the meantime she can use loperamide as needed.

## 2013-08-02 LAB — TISSUE TRANSGLUTAMINASE, IGA: Tissue Transglutaminase Ab, IgA: 5.5 U/mL (ref <20)

## 2013-08-03 NOTE — Progress Notes (Signed)
Quick Note:  Testing is negative for celiac disease (gluten allergy) Start Colestipol 5 g packet of granules or 1 scoop if can with supper Call back in 2 weeks with an update re: sxs ______

## 2013-08-04 ENCOUNTER — Other Ambulatory Visit: Payer: Self-pay

## 2013-08-04 MED ORDER — COLESTIPOL HCL 5 G PO PACK: 5.0000 g | PACK | Freq: Every day | ORAL | Status: DC

## 2013-08-09 ENCOUNTER — Encounter: Payer: 59 | Admitting: Family Medicine

## 2013-08-11 ENCOUNTER — Encounter: Payer: Self-pay | Admitting: Family Medicine

## 2013-08-11 ENCOUNTER — Ambulatory Visit (INDEPENDENT_AMBULATORY_CARE_PROVIDER_SITE_OTHER): Payer: 59 | Admitting: Family Medicine

## 2013-08-11 VITALS — BP 138/86 | HR 76 | Temp 98.5°F | Ht 66.0 in | Wt 151.5 lb

## 2013-08-11 DIAGNOSIS — Z0001 Encounter for general adult medical examination with abnormal findings: Secondary | ICD-10-CM | POA: Insufficient documentation

## 2013-08-11 DIAGNOSIS — Z0000 Encounter for general adult medical examination without abnormal findings: Secondary | ICD-10-CM | POA: Insufficient documentation

## 2013-08-11 DIAGNOSIS — R252 Cramp and spasm: Secondary | ICD-10-CM | POA: Insufficient documentation

## 2013-08-11 DIAGNOSIS — Z23 Encounter for immunization: Secondary | ICD-10-CM

## 2013-08-11 DIAGNOSIS — K52832 Lymphocytic colitis: Secondary | ICD-10-CM

## 2013-08-11 DIAGNOSIS — G4762 Sleep related leg cramps: Secondary | ICD-10-CM | POA: Insufficient documentation

## 2013-08-11 DIAGNOSIS — R3129 Other microscopic hematuria: Secondary | ICD-10-CM

## 2013-08-11 DIAGNOSIS — E785 Hyperlipidemia, unspecified: Secondary | ICD-10-CM

## 2013-08-11 MED ORDER — LORAZEPAM 1 MG PO TABS: 1.0000 mg | ORAL_TABLET | Freq: Every evening | ORAL | Status: DC | PRN

## 2013-08-11 NOTE — Patient Instructions (Signed)
Tdap today (tetanus and pertussis) Return as needed or in 6-8 months for follow up. Blood work today. Good to see you today, call us with questions.

## 2013-08-11 NOTE — Assessment & Plan Note (Signed)
Preventative protocols reviewed and updated unless pt declined. Discussed healthy diet and lifestyle.  Reviewed recent colonoscopy.

## 2013-08-11 NOTE — Assessment & Plan Note (Signed)
Pt declines med for this. Low chol diet handout provided today.

## 2013-08-11 NOTE — Assessment & Plan Note (Signed)
Check K, Mg and CK.

## 2013-08-11 NOTE — Assessment & Plan Note (Signed)
Following with Dr. Junious Silk.

## 2013-08-11 NOTE — Assessment & Plan Note (Signed)
Treated with entocort, then did not tolerate colestipol.

## 2013-08-11 NOTE — Progress Notes (Addendum)
BP 138/86  Pulse 76  Temp(Src) 98.5 F (36.9 C) (Oral)  Ht 5\' 6"  (1.676 m)  Wt 151 lb 8 oz (68.72 kg)  BMI 24.46 kg/m2   CC: annual exam  Subjective:    Patient ID: Shannon Kelly, female    DOB: 07-21-1950, 63 y.o.   MRN: [id number]  HPI: Shannon Kelly is a 63 y.o. 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  Did not resolve colitis.  Did not tolerate colestipol.  Wants to try apples.  HLD - chol levels have been running high.  Does not want to take cholesterol medications.  Preventative:  Colonoscopy - 06/2013 - microscopic colitis, rpt 5 yrs Carlean Purl) Well woman - with OBGYN prior in [place] Dr. Wyvonne Lenz) 2013. S/p hysterectomy.  Pap smear WNL 10/2011 [facility]) Mammogram - 06/2013 WNL. DEXA 2013 - WNL Flu shot today Tdap - today zostavax - 05/2012  Seat belt use discussed. Sunscreen use discussed.  Denies sunburns in the last year.  Has derm appt in march  Married, 1 son Had been living in [place], return to [place] fall 2014 Occupation: Retired, prior worked at [employer]: [employer] Activity: plays with dog, some walking  Diet: good water, fruits/vegetables daily  Relevant past medical, surgical, family and social history reviewed and updated. Allergies and medications reviewed and updated. Current Outpatient Prescriptions on File Prior to Visit  Medication Sig  . celecoxib (CELEBREX) 200 MG capsule Take 200 mg by mouth 2 (two) times daily as needed.   . loperamide (LOPERAMIDE A-D) 2 MG tablet Take 1 tablet (2 mg total) by mouth 4 (four) times daily as needed for diarrhea or loose stools.   No current facility-administered medications on file prior to visit.    Review of Systems  Constitutional: Negative for fever, chills, activity change, appetite change, fatigue and unexpected weight change.  HENT: Negative for hearing loss.   Eyes: Positive for visual  disturbance (recent cataract surgery).  Respiratory: Negative for cough, chest tightness, shortness of breath and wheezing.   Cardiovascular: Negative for chest pain, palpitations and leg swelling.  Gastrointestinal: Positive for diarrhea (pasty stools). Negative for nausea, vomiting, abdominal pain, constipation, blood in stool and abdominal distention.  Genitourinary: Negative for hematuria and difficulty urinating.  Musculoskeletal: Negative for arthralgias, myalgias and neck pain.       Hands and legs cramp intermittently  Skin: Negative for rash.  Neurological: Positive for headaches. Negative for dizziness, seizures and syncope.  Hematological: Negative for adenopathy. Does not bruise/bleed easily.  Psychiatric/Behavioral: Negative for dysphoric mood. The patient is not nervous/anxious.    Per HPI unless specifically indicated above    Objective:    BP 138/86  Pulse 76  Temp(Src) 98.5 F (36.9 C) (Oral)  Ht 5\' 6"  (1.676 m)  Wt 151 lb 8 oz (68.72 kg)  BMI 24.46 kg/m2  Physical Exam  Nursing note and vitals reviewed. Constitutional: She is oriented to person, place, and time. She appears well-developed and well-nourished. No distress.  HENT:  Head: Normocephalic and atraumatic.  Right Ear: Hearing, tympanic membrane, external ear and ear canal normal.  Left Ear: Hearing, tympanic membrane, external ear and ear canal normal.  Nose: Nose normal.  Mouth/Throat: Uvula is midline, oropharynx is clear and moist and mucous membranes are normal. No oropharyngeal exudate, posterior oropharyngeal edema or posterior oropharyngeal erythema.  Eyes: Conjunctivae and EOM are normal. Pupils are equal, round, and reactive  to light. No scleral icterus.  Neck: Normal range of motion. Neck supple. Carotid bruit is not present.  Cardiovascular: Normal rate, regular rhythm, normal heart sounds and intact distal pulses.   No murmur heard. Pulses:      Radial pulses are 2+ on the right side, and 2+  on the left side.  Pulmonary/Chest: Effort normal and breath sounds normal. No respiratory distress. She has no wheezes. She has no rales.  Abdominal: Soft. Normal appearance and bowel sounds are normal. She exhibits no distension and no mass. There is no hepatosplenomegaly. There is tenderness (mild) in the suprapubic area. There is no rigidity, no rebound, no guarding and negative Murphy's sign.  Musculoskeletal: Normal range of motion. She exhibits no edema.  Lymphadenopathy:    She has no cervical adenopathy.  Neurological: She is alert and oriented to person, place, and time.  CN grossly intact, station and gait intact  Skin: Skin is warm and dry. No rash noted.  Psychiatric: She has a normal mood and affect. Her behavior is normal. Judgment and thought content normal.   Results for orders placed in visit on 08/01/13  IGA      Result Value Range   IgA 193  68 - 378 mg/dL  TISSUE TRANSGLUTAMINASE, IGA      Result Value Range   Tissue Transglutaminase Ab, IgA 5.5  <20 U/mL      Assessment & Plan:   Problem List Items Addressed This Visit   Health care maintenance - Primary     Preventative protocols reviewed and updated unless pt declined. Discussed healthy diet and lifestyle.  Reviewed recent colonoscopy.    HLD (hyperlipidemia)     Pt declines med for this. Low chol diet handout provided today.    Leg cramping     Check K, Mg and CK.    Relevant Orders      CK      Magnesium      Potassium   Lymphocytic colitis     Treated with entocort, then did not tolerate colestipol.    Microhematuria     Following with Dr. Junious Silk.        Follow up plan: Return in about 8 months (around 04/10/2014), or as needed, for follow up.

## 2013-08-11 NOTE — Progress Notes (Signed)
Pre-visit discussion using our clinic review tool. No additional management support is needed unless otherwise documented below in the visit note.  

## 2013-08-11 NOTE — Addendum Note (Signed)
Addended by: Royann Shivers A on: 08/11/2013 03:03 PM   Modules accepted: Orders

## 2013-08-14 LAB — CK: Total CK: 92 U/L (ref 7–177)

## 2013-08-14 LAB — POTASSIUM: Potassium: 4.1 mEq/L (ref 3.5–5.1)

## 2013-08-14 LAB — MAGNESIUM: Magnesium: 2.2 mg/dL (ref 1.5–2.5)

## 2013-08-15 ENCOUNTER — Encounter: Payer: Self-pay | Admitting: Family Medicine

## 2013-08-15 ENCOUNTER — Other Ambulatory Visit: Payer: Self-pay

## 2013-08-15 MED ORDER — LORAZEPAM 1 MG PO TABS: 1.0000 mg | ORAL_TABLET | Freq: Every evening | ORAL | Status: DC | PRN

## 2013-08-15 NOTE — Telephone Encounter (Signed)
plz phone in higher # for patient.  thanks

## 2013-08-15 NOTE — Telephone Encounter (Signed)
Rx called in as directed.   

## 2013-08-15 NOTE — Telephone Encounter (Signed)
Shannon Kelly with [facility] left v/m requesting cb; pt was requesting more than  15 day supply of lorazepam; pt would like 30 day supply of lorazepam.Please advise.

## 2013-08-16 NOTE — Telephone Encounter (Signed)
This was taken care of yesterday

## 2013-09-18 ENCOUNTER — Encounter: Payer: Self-pay | Admitting: Family Medicine

## 2013-10-27 ENCOUNTER — Other Ambulatory Visit: Payer: Self-pay | Admitting: Family Medicine

## 2013-10-27 NOTE — Telephone Encounter (Signed)
Ok to refill in Dr. Synthia Innocent absence? Last filled 08/15/13.

## 2013-10-27 NOTE — Telephone Encounter (Signed)
Rx called in as directed.   

## 2013-10-27 NOTE — Telephone Encounter (Signed)
Will refill times one in PCP absence Px written for call in   

## 2013-12-12 ENCOUNTER — Other Ambulatory Visit: Payer: Self-pay | Admitting: Family Medicine

## 2013-12-12 NOTE — Telephone Encounter (Signed)
plz phone in. 

## 2013-12-12 NOTE — Telephone Encounter (Signed)
Last filled 10/27/13

## 2013-12-13 NOTE — Telephone Encounter (Signed)
Rx called in as directed.   

## 2014-01-09 ENCOUNTER — Telehealth: Payer: Self-pay

## 2014-01-09 MED ORDER — EPINEPHRINE 0.3 MG/0.3ML IJ SOAJ: 0.3000 mg | Freq: Once | INTRAMUSCULAR | Status: DC

## 2014-01-09 NOTE — Telephone Encounter (Signed)
Plz notify this was sent in.  ?

## 2014-01-09 NOTE — Telephone Encounter (Signed)
Pt left v/m; pt is allergic to bees and pts epipens are out dated. Pt has not gotten epipens since moved from [place]. Pt request 2 epipens injectors sent to [facility]. Please advise.

## 2014-01-09 NOTE — Telephone Encounter (Signed)
Patient notified

## 2014-01-29 ENCOUNTER — Other Ambulatory Visit: Payer: Self-pay | Admitting: Family Medicine

## 2014-01-29 NOTE — Telephone Encounter (Signed)
Ok to refill 

## 2014-01-29 NOTE — Telephone Encounter (Signed)
Rx called in as directed.   

## 2014-01-29 NOTE — Telephone Encounter (Signed)
plz phone in. 

## 2014-01-31 NOTE — Telephone Encounter (Signed)
Pt called and said that she requested Lorazepam RX on 01/29/14 and that she normally gets #60 but that she only got #30.  I informed her that the RX was written for #60.  [facility] to verify that RX was called in correctly.  The pharmacy tech said that RX was called in for #30, old RX d/c'd and new RX given verbally for #60.

## 2014-03-21 ENCOUNTER — Encounter: Payer: Self-pay | Admitting: Family Medicine

## 2014-04-27 ENCOUNTER — Ambulatory Visit (INDEPENDENT_AMBULATORY_CARE_PROVIDER_SITE_OTHER): Payer: 59

## 2014-04-27 DIAGNOSIS — Z23 Encounter for immunization: Secondary | ICD-10-CM

## 2014-07-09 ENCOUNTER — Other Ambulatory Visit: Payer: Self-pay | Admitting: Family Medicine

## 2014-07-16 ENCOUNTER — Other Ambulatory Visit: Payer: Self-pay

## 2014-07-16 DIAGNOSIS — Z1231 Encounter for screening mammogram for malignant neoplasm of breast: Secondary | ICD-10-CM

## 2014-07-20 ENCOUNTER — Ambulatory Visit: Payer: Self-pay

## 2014-07-20 ENCOUNTER — Ambulatory Visit
Admission: RE | Admit: 2014-07-20 | Discharge: 2014-07-20 | Disposition: A | Discharge status: Home / Self Care | Payer: 59 | Source: Ambulatory Visit

## 2014-07-20 DIAGNOSIS — Z1231 Encounter for screening mammogram for malignant neoplasm of breast: Secondary | ICD-10-CM

## 2014-07-24 ENCOUNTER — Other Ambulatory Visit: Payer: Self-pay | Admitting: Family Medicine

## 2014-07-24 DIAGNOSIS — R928 Other abnormal and inconclusive findings on diagnostic imaging of breast: Secondary | ICD-10-CM

## 2014-07-26 ENCOUNTER — Encounter: Payer: Self-pay | Admitting: *Deleted

## 2014-07-26 ENCOUNTER — Ambulatory Visit
Admission: RE | Admit: 2014-07-26 | Discharge: 2014-07-26 | Disposition: A | Discharge status: Home / Self Care | Payer: 59 | Source: Ambulatory Visit | Attending: Family Medicine | Admitting: Family Medicine

## 2014-07-26 DIAGNOSIS — R928 Other abnormal and inconclusive findings on diagnostic imaging of breast: Secondary | ICD-10-CM

## 2014-07-26 LAB — HM MAMMOGRAPHY: HM MAMMO: NORMAL

## 2014-09-22 ENCOUNTER — Other Ambulatory Visit: Payer: Self-pay | Admitting: Family Medicine

## 2014-09-22 DIAGNOSIS — K52832 Lymphocytic colitis: Secondary | ICD-10-CM

## 2014-09-22 DIAGNOSIS — E785 Hyperlipidemia, unspecified: Secondary | ICD-10-CM

## 2014-09-25 ENCOUNTER — Other Ambulatory Visit (INDEPENDENT_AMBULATORY_CARE_PROVIDER_SITE_OTHER): Payer: 59

## 2014-09-25 DIAGNOSIS — E785 Hyperlipidemia, unspecified: Secondary | ICD-10-CM

## 2014-09-25 DIAGNOSIS — K5289 Other specified noninfective gastroenteritis and colitis: Secondary | ICD-10-CM

## 2014-09-25 DIAGNOSIS — K52832 Lymphocytic colitis: Secondary | ICD-10-CM

## 2014-09-25 LAB — CBC WITH DIFFERENTIAL/PLATELET
BASOS ABS: 0 10*3/uL (ref 0.0–0.1)
Basophils Relative: 0.7 % (ref 0.0–3.0)
EOS ABS: 0.1 10*3/uL (ref 0.0–0.7)
EOS PCT: 1.4 % (ref 0.0–5.0)
HEMATOCRIT: 37.3 % (ref 36.0–46.0)
Hemoglobin: 12.9 g/dL (ref 12.0–15.0)
Lymphocytes Relative: 38.8 % (ref 12.0–46.0)
Lymphs Abs: 2.1 10*3/uL (ref 0.7–4.0)
MCHC: 34.5 g/dL (ref 30.0–36.0)
MCV: 89.2 fl (ref 78.0–100.0)
MONOS PCT: 8.4 % (ref 3.0–12.0)
Monocytes Absolute: 0.4 10*3/uL (ref 0.1–1.0)
NEUTROS ABS: 2.7 10*3/uL (ref 1.4–7.7)
Neutrophils Relative %: 50.7 % (ref 43.0–77.0)
PLATELETS: 281 10*3/uL (ref 150.0–400.0)
RBC: 4.19 Mil/uL (ref 3.87–5.11)
RDW: 13.8 % (ref 11.5–15.5)
WBC: 5.3 10*3/uL (ref 4.0–10.5)

## 2014-09-25 LAB — BASIC METABOLIC PANEL
BUN: 19 mg/dL (ref 6–23)
CHLORIDE: 105 meq/L (ref 96–112)
CO2: 30 meq/L (ref 19–32)
Calcium: 9.3 mg/dL (ref 8.4–10.5)
Creatinine, Ser: 0.96 mg/dL (ref 0.40–1.20)
GFR: 62.39 mL/min (ref >60.00)
Glucose, Bld: 87 mg/dL (ref 70–99)
Potassium: 4 mEq/L (ref 3.5–5.1)
Sodium: 139 mEq/L (ref 135–145)

## 2014-09-25 LAB — LIPID PANEL
CHOL/HDL RATIO: 4
Cholesterol: 197 mg/dL (ref 0–200)
HDL: 51.8 mg/dL (ref >39.00)
LDL Cholesterol: 124 mg/dL — ABNORMAL HIGH (ref 0–99)
NONHDL: 145.2
Triglycerides: 107 mg/dL (ref 0.0–149.0)
VLDL: 21.4 mg/dL (ref 0.0–40.0)

## 2014-10-02 ENCOUNTER — Encounter: Payer: Self-pay | Admitting: Family Medicine

## 2014-10-02 ENCOUNTER — Ambulatory Visit (INDEPENDENT_AMBULATORY_CARE_PROVIDER_SITE_OTHER): Payer: 59 | Admitting: Family Medicine

## 2014-10-02 VITALS — BP 120/72 | HR 77 | Temp 98.0°F | Ht 65.5 in | Wt 158.1 lb

## 2014-10-02 DIAGNOSIS — R319 Hematuria, unspecified: Secondary | ICD-10-CM | POA: Diagnosis not present

## 2014-10-02 DIAGNOSIS — K52832 Lymphocytic colitis: Secondary | ICD-10-CM

## 2014-10-02 DIAGNOSIS — G4700 Insomnia, unspecified: Secondary | ICD-10-CM

## 2014-10-02 DIAGNOSIS — R3129 Other microscopic hematuria: Secondary | ICD-10-CM

## 2014-10-02 DIAGNOSIS — Z0000 Encounter for general adult medical examination without abnormal findings: Secondary | ICD-10-CM | POA: Diagnosis not present

## 2014-10-02 DIAGNOSIS — E785 Hyperlipidemia, unspecified: Secondary | ICD-10-CM

## 2014-10-02 LAB — POCT URINALYSIS DIPSTICK
BILIRUBIN UA: NEGATIVE
Glucose, UA: NEGATIVE
Ketones, UA: NEGATIVE
Nitrite, UA: NEGATIVE
PROTEIN UA: NEGATIVE
Spec Grav, UA: >=1.03
Urobilinogen, UA: 1
pH, UA: 6

## 2014-10-02 MED ORDER — EPINEPHRINE 0.3 MG/0.3ML IJ SOAJ: 0.3000 mg | Freq: Once | INTRAMUSCULAR | Status: DC

## 2014-10-02 NOTE — Assessment & Plan Note (Addendum)
Check UA today. Released from uro care - hematuria seemed to resolve. UA abnormal - but urine not saved for microscopy. Check UCx and if abnormal discuss with patient. If normal, I will have pt return for rpt UA and microscopy.

## 2014-10-02 NOTE — Assessment & Plan Note (Signed)
Marked improvement in #s. Congratulated. Significant healthy diet and lifestyle changes. No need for meds currently. Does have fmhx CAD.

## 2014-10-02 NOTE — Addendum Note (Signed)
Addended by: Ellamae Sia on: 10/02/2014 05:21 PM   Modules accepted: Orders

## 2014-10-02 NOTE — Assessment & Plan Note (Signed)
Insomnia - takes 2 tylenol PM regularly to help sleep. Lorazepam lost effect, benadryl ineffective. Has tried melatonin as well - ineffective. Trazodone caused significant dry mouth. ambien initially helpful. Cr not helpful.

## 2014-10-02 NOTE — Addendum Note (Signed)
Addended by: Amado Coe on: 10/02/2014 04:22 PM   Modules accepted: Orders

## 2014-10-02 NOTE — Progress Notes (Addendum)
BP 120/72 mmHg  Pulse 77  Temp(Src) 98 F (36.7 C) (Oral)  Ht 5' 5.5" (1.664 m)  Wt 158 lb 1.9 oz (71.723 kg)  BMI 25.90 kg/m2  SpO2 99%   CC: CPE  Subjective:    Patient ID: Shannon Kelly, female    DOB: 28-Oct-1950, 64 y.o.   MRN: [id number]  HPI: Shannon Kelly is a 64 y.o. female presenting on 10/02/2014 for Annual Exam   Recent life-line screening results:  Normal carotid blood flow Normal 4 limb EKG, HR 57 Normal AAA screen Normal ABI bilaterally Low risk bone density BMI 25  Insomnia - takes 2 tylenol PM regularly to help sleep. Lorazepam lost effect, benadryl ineffective. Has tried melatonin as well - ineffective. Trazodone caused significant dry mouth. ambien initially helpful.   Persistent diarrhea - h/o microscopic colitis. Colestipol ineffective.   Occasional dizziness around silver sneakers exercise programs - encouraged good hydration status..  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 Wyvonne Lenz) 2013. S/p hysterectomy.  Pap smear WNL 10/2011 ([facility]).  Mammogram - 07/2014 WNL.  DEXA 2013 - WNL Flu shot 04/2014 Tdap - 08/2013 zostavax - 05/2012 Seat belt use discussed. Sunscreen use discussed. Denies sunburns in the last year. Has derm appt in march  Lives with husband, married, 1 son Had been living in [place], return to [place] fall 2014 Occupation: Retired, prior worked at [employer]: [employer] Activity: plays with dog, outdoor walking, joined gym  Diet: good water, fruits/vegetables daily  Relevant past medical, surgical, family and social history reviewed and updated as indicated. Interim medical history since our last visit reviewed. Allergies and medications reviewed and updated. No current outpatient prescriptions on file prior to visit.   No current facility-administered medications on file prior to visit.    Review of  Systems  Constitutional: Negative for fever, chills, activity change, appetite change, fatigue and unexpected weight change.  HENT: Negative for hearing loss.   Eyes: Positive for visual disturbance (followed for R vision changes).  Respiratory: Negative for cough, chest tightness, shortness of breath and wheezing.   Cardiovascular: Negative for chest pain, palpitations and leg swelling.  Gastrointestinal: Negative for nausea, vomiting, abdominal pain, diarrhea, constipation, blood in stool and abdominal distention.  Genitourinary: Negative for hematuria and difficulty urinating.  Musculoskeletal: Negative for myalgias, arthralgias and neck pain.  Skin: Negative for rash.  Neurological: Positive for dizziness (occasionally when working out). Negative for seizures, syncope and headaches.  Hematological: Negative for adenopathy. Does not bruise/bleed easily.  Psychiatric/Behavioral: Negative for dysphoric mood. The patient is not nervous/anxious.    Per HPI unless specifically indicated above     Objective:    BP 120/72 mmHg  Pulse 77  Temp(Src) 98 F (36.7 C) (Oral)  Ht 5' 5.5" (1.664 m)  Wt 158 lb 1.9 oz (71.723 kg)  BMI 25.90 kg/m2  SpO2 99%  Wt Readings from Last 3 Encounters:  10/02/14 158 lb 1.9 oz (71.723 kg)  08/11/13 151 lb 8 oz (68.72 kg)  08/01/13 151 lb 3.2 oz (68.584 kg)    Physical Exam  Constitutional: She is oriented to person, place, and time. She appears well-developed and well-nourished. No distress.  HENT:  Head: Normocephalic and atraumatic.  Right Ear: Hearing, tympanic membrane, external ear and ear canal normal.  Left Ear: Hearing, tympanic membrane, external ear and ear canal normal.  Nose: Nose normal.  Mouth/Throat: Uvula is midline,  oropharynx is clear and moist and mucous membranes are normal. No oropharyngeal exudate, posterior oropharyngeal edema or posterior oropharyngeal erythema.  Eyes: Conjunctivae and EOM are normal. Pupils are equal, round,  and reactive to light. No scleral icterus.  Neck: Normal range of motion. Neck supple. Carotid bruit is not present. No thyromegaly present.  Cardiovascular: Normal rate, regular rhythm, normal heart sounds and intact distal pulses.   No murmur heard. Pulses:      Radial pulses are 2+ on the right side, and 2+ on the left side.  Pulmonary/Chest: Effort normal and breath sounds normal. No respiratory distress. She has no wheezes. She has no rales.  Breast exam - pt declines  Abdominal: Soft. Bowel sounds are normal. She exhibits no distension and no mass. There is no tenderness. There is no rebound and no guarding.  Genitourinary:  GYN exam - s/p hysterectomy. Bimanual declined by patient.  Musculoskeletal: Normal range of motion. She exhibits no edema.  Lymphadenopathy:    She has no cervical adenopathy.  Neurological: She is alert and oriented to person, place, and time.  CN grossly intact, station and gait intact  Skin: Skin is warm and dry. No rash noted.  Psychiatric: She has a normal mood and affect. Her behavior is normal. Judgment and thought content normal.  Nursing note and vitals reviewed.  Results for orders placed or performed in visit on 10/02/14  POCT urinalysis dipstick  Result Value Ref Range   Color, UA Yellow    Clarity, UA clear    Glucose, UA negative    Bilirubin, UA negative    Ketones, UA negative    Spec Grav, UA >=1.030    Blood, UA 1+    pH, UA 6.0    Protein, UA negative    Urobilinogen, UA 1.0    Nitrite, UA negative    Leukocytes, UA large (3+)       Assessment & Plan:   Problem List Items Addressed This Visit    Microhematuria    Check UA today. Released from uro care - hematuria seemed to resolve. UA abnormal - but urine not saved for microscopy. Check UCx and if abnormal discuss with patient. If normal, I will have pt return for rpt UA and microscopy.      Relevant Orders   Urine culture   Lymphocytic colitis    Persistent loose stools,  but no pain.       Insomnia    Insomnia - takes 2 tylenol PM regularly to help sleep. Lorazepam lost effect, benadryl ineffective. Has tried melatonin as well - ineffective. Trazodone caused significant dry mouth. ambien initially helpful. Cr not helpful.      HLD (hyperlipidemia)    Marked improvement in #s. Congratulated. Significant healthy diet and lifestyle changes. No need for meds currently. Does have fmhx CAD.      Relevant Medications   EPINEPHrine (EPIPEN 2-PAK) 0.3 mg/0.3 mL IJ SOAJ injection   Health care maintenance - Primary    Preventative protocols reviewed and updated unless pt declined. Discussed healthy diet and lifestyle.        Other Visit Diagnoses    Hematuria        Relevant Orders    POCT urinalysis dipstick (Completed)    Urine culture        Follow up plan: Return in about 1 year (around 10/02/2015), or as needed, for annual exam, prior fasting for blood work.

## 2014-10-02 NOTE — Patient Instructions (Addendum)
We will check orthostatic vital signs today. Urinalysis today. Good to see you today, call us with questions. Return as needed or in 1 year for next physical.

## 2014-10-02 NOTE — Assessment & Plan Note (Signed)
Persistent loose stools, but no pain.

## 2014-10-02 NOTE — Progress Notes (Signed)
Pre visit review using our clinic review tool, if applicable. No additional management support is needed unless otherwise documented below in the visit note. 

## 2014-10-02 NOTE — Assessment & Plan Note (Signed)
Preventative protocols reviewed and updated unless pt declined. Discussed healthy diet and lifestyle.  

## 2014-10-03 LAB — URINE CULTURE
Colony Count: NO GROWTH
Organism ID, Bacteria: NO GROWTH

## 2014-10-03 NOTE — Addendum Note (Signed)
Addended by: Ria Bush on: 10/03/2014 07:22 AM   Modules accepted: Miquel Dunn

## 2014-10-04 ENCOUNTER — Telehealth: Payer: Self-pay | Admitting: Family Medicine

## 2014-10-04 ENCOUNTER — Encounter: Payer: Self-pay | Admitting: Family Medicine

## 2014-10-04 NOTE — Telephone Encounter (Signed)
Spoke with patient.

## 2014-10-04 NOTE — Telephone Encounter (Signed)
Pt returned call from Maudie Mercury, [place] requested that if she doesn't answer her home # please call her cell phone [phone]

## 2014-10-29 ENCOUNTER — Other Ambulatory Visit (INDEPENDENT_AMBULATORY_CARE_PROVIDER_SITE_OTHER): Payer: 59

## 2014-10-29 DIAGNOSIS — R3129 Other microscopic hematuria: Secondary | ICD-10-CM

## 2014-10-29 DIAGNOSIS — R312 Other microscopic hematuria: Secondary | ICD-10-CM | POA: Diagnosis not present

## 2014-10-29 LAB — URINALYSIS, ROUTINE W REFLEX MICROSCOPIC
Bilirubin Urine: NEGATIVE
KETONES UR: NEGATIVE
Nitrite: NEGATIVE
PH: 6 (ref 5.0–8.0)
RBC / HPF: NONE SEEN (ref 0–?)
Total Protein, Urine: NEGATIVE
Urine Glucose: NEGATIVE
Urobilinogen, UA: 0.2 (ref 0.0–1.0)
WBC UA: NONE SEEN (ref 0–?)

## 2014-10-30 ENCOUNTER — Other Ambulatory Visit: Payer: Self-pay | Admitting: Family Medicine

## 2014-12-13 ENCOUNTER — Other Ambulatory Visit: Payer: Self-pay | Admitting: Family Medicine

## 2014-12-13 NOTE — Telephone Encounter (Signed)
plz phone in. 

## 2014-12-13 NOTE — Telephone Encounter (Signed)
Medication phoned to pharmacy.  

## 2015-05-13 ENCOUNTER — Other Ambulatory Visit: Payer: Self-pay

## 2015-05-13 NOTE — Telephone Encounter (Signed)
Pt left v/m requesting refill lorazepam (last refilled # 60 on 12/13/14) pt last seen annual 10/20/2014. Pt's brother passed away and pts father diagnosed with pancreatic cancer and Avalina is her fathers care giver. Pt also request trazodone last refilled 2014. Trazodone not on current med list. [facility].

## 2015-05-14 MED ORDER — TRAZODONE HCL 50 MG PO TABS: 25.0000 mg | ORAL_TABLET | Freq: Every evening | ORAL | Status: DC | PRN

## 2015-05-14 MED ORDER — LORAZEPAM 1 MG PO TABS: ORAL_TABLET | ORAL | Status: DC

## 2015-05-14 NOTE — Telephone Encounter (Signed)
I'm sorry to hear. plz phone in lorazepam.  Trazodone sent in as well.

## 2015-05-14 NOTE — Telephone Encounter (Signed)
Rx called in as directed.   

## 2015-05-15 ENCOUNTER — Ambulatory Visit (INDEPENDENT_AMBULATORY_CARE_PROVIDER_SITE_OTHER): Payer: 59

## 2015-05-15 DIAGNOSIS — Z23 Encounter for immunization: Secondary | ICD-10-CM

## 2015-06-24 ENCOUNTER — Other Ambulatory Visit: Payer: Self-pay | Admitting: Family Medicine

## 2015-06-24 NOTE — Telephone Encounter (Signed)
Ok to refill 

## 2015-06-25 NOTE — Telephone Encounter (Signed)
Rx called in as directed.   

## 2015-06-25 NOTE — Telephone Encounter (Signed)
plz phone in. 

## 2015-07-02 ENCOUNTER — Other Ambulatory Visit: Payer: Self-pay

## 2015-07-02 DIAGNOSIS — Z1231 Encounter for screening mammogram for malignant neoplasm of breast: Secondary | ICD-10-CM

## 2015-07-24 ENCOUNTER — Ambulatory Visit (INDEPENDENT_AMBULATORY_CARE_PROVIDER_SITE_OTHER): Payer: 59 | Admitting: Family Medicine

## 2015-07-24 ENCOUNTER — Encounter: Payer: Self-pay | Admitting: Family Medicine

## 2015-07-24 VITALS — BP 132/84 | HR 76 | Temp 98.3°F | Wt 156.0 lb

## 2015-07-24 DIAGNOSIS — R109 Unspecified abdominal pain: Secondary | ICD-10-CM | POA: Diagnosis not present

## 2015-07-24 DIAGNOSIS — F5104 Psychophysiologic insomnia: Secondary | ICD-10-CM

## 2015-07-24 DIAGNOSIS — R202 Paresthesia of skin: Secondary | ICD-10-CM

## 2015-07-24 DIAGNOSIS — R10A1 Flank pain, right side: Secondary | ICD-10-CM | POA: Insufficient documentation

## 2015-07-24 DIAGNOSIS — G4700 Insomnia, unspecified: Secondary | ICD-10-CM

## 2015-07-24 DIAGNOSIS — R3129 Other microscopic hematuria: Secondary | ICD-10-CM

## 2015-07-24 DIAGNOSIS — G5601 Carpal tunnel syndrome, right upper limb: Secondary | ICD-10-CM | POA: Insufficient documentation

## 2015-07-24 DIAGNOSIS — Z809 Family history of malignant neoplasm, unspecified: Secondary | ICD-10-CM

## 2015-07-24 DIAGNOSIS — R200 Anesthesia of skin: Secondary | ICD-10-CM

## 2015-07-24 LAB — POC URINALSYSI DIPSTICK (AUTOMATED)
BILIRUBIN UA: NEGATIVE
Blood, UA: NEGATIVE
GLUCOSE UA: NEGATIVE
KETONES UA: NEGATIVE
LEUKOCYTES UA: NEGATIVE
Nitrite, UA: NEGATIVE
Protein, UA: NEGATIVE
Spec Grav, UA: 1.01
Urobilinogen, UA: 0.2
pH, UA: 7

## 2015-07-24 MED ORDER — ZOLPIDEM TARTRATE 10 MG PO TABS: 10.0000 mg | ORAL_TABLET | Freq: Every evening | ORAL | Status: DC | PRN

## 2015-07-24 NOTE — Progress Notes (Signed)
Pre visit review using our clinic review tool, if applicable. No additional management support is needed unless otherwise documented below in the visit note. 

## 2015-07-24 NOTE — Assessment & Plan Note (Signed)
2 first degree relatives with pancreatic cancer, several other cancers in family. Discussed genetics referral. Discussed GI referral to discuss pancreatic cancer screening.

## 2015-07-24 NOTE — Patient Instructions (Addendum)
Urinalysis today. Continue trazodone. Trial ambien for sleep.  With significant family history of cancer, consider genetics referral or pancreatic cancer screening (GI).  Check pillow for cervical support. Return for physical

## 2015-07-24 NOTE — Assessment & Plan Note (Signed)
Sounds [facility]. Check UA.

## 2015-07-24 NOTE — Assessment & Plan Note (Signed)
Discussed current regimen - ineffective (trazodone 50mg  nightly, tylenol PM, magnesium, intermittent lorazepam). Will trial Lorrin Mais which was effective in the past.

## 2015-07-24 NOTE — Assessment & Plan Note (Signed)
Recheck UA today.  

## 2015-07-24 NOTE — Progress Notes (Signed)
BP 132/84 mmHg  Pulse 76  Temp(Src) 98.3 F (36.8 C) (Oral)  Wt 156 lb (70.761 kg)   CC: multiple concerns Subjective:    Patient ID: [name], female    DOB: [birth year], 65 y.o.   MRN: [id number]  HPI: GIONA SELIM is a 65 y.o. female presenting on 07/24/2015 for Insomnia; Stress; and Numbness   Increased stress over last few months. Lost brother and father to pancreatic cancer late last year.   Chronic insomnia over 20 yrs - worsening recently. Initiation and maintenance. Currently taking trazodone 50mg  nightly, 1 tylenol PM, magnesium tablet. Trazodone caused significant dry mouth but does feel this significantly helps. Intermittently takes lorazepam.   Lorazepam ineffective, benadryl ineffective. Has tried melatonin as well - ineffective. Ambien CR had opposite effect - woke her up more.   Occasional R hand numbness worse at nigh time. This wkes her up. Points some to forearm but mostly R entire hand. No neck pain or shooting pain down arms, weakness, carries stress in neck. She does participate in silver sneakers program.   Ongoing R flank pain in h/o microhematuria. Normal CT w/ w/o contrast, saw urologist as well. No cystoscopy.   Relevant past medical, surgical, family and social history reviewed and updated as indicated. Interim medical history since our last visit reviewed. Allergies and medications reviewed and updated. Current Outpatient Prescriptions on File Prior to Visit  Medication Sig  . celecoxib (CELEBREX) 200 MG capsule TAKE 1 CAPSULE BY MOUTH DAILY WITH MEALS.  Marland Kitchen diphenhydramine-acetaminophen (TYLENOL PM) 25-500 MG TABS Take 1 tablet by mouth at bedtime as needed.  Marland Kitchen LORazepam (ATIVAN) 1 MG tablet TAKE 1 OR 2 TABLETS BY MOUTH AT BEDTIME AS NEEDED  . traZODone (DESYREL) 50 MG tablet Take 0.5-1 tablets (25-50 mg total) by mouth at bedtime as needed for sleep.  Marland Kitchen EPINEPHrine (EPIPEN 2-PAK) 0.3 mg/0.3 mL IJ SOAJ injection Inject 0.3 mLs (0.3 mg total) into  the muscle once. (Patient not taking: Reported on 07/24/2015)   No current facility-administered medications on file prior to visit.    Review of Systems Per HPI unless specifically indicated in ROS section     Objective:    BP 132/84 mmHg  Pulse 76  Temp(Src) 98.3 F (36.8 C) (Oral)  Wt 156 lb (70.761 kg)  Wt Readings from Last 3 Encounters:  07/24/15 156 lb (70.761 kg)  10/02/14 158 lb 1.9 oz (71.723 kg)  08/11/13 151 lb 8 oz (68.72 kg)   Body mass index is 25.56 kg/(m^2).   Physical Exam  Constitutional: She is oriented to person, place, and time. She appears well-developed and well-nourished. No distress.  HENT:  Mouth/Throat: Oropharynx is clear and moist. No oropharyngeal exudate.  Abdominal: Soft. Normal appearance and bowel sounds are normal. She exhibits no distension and no mass. There is no hepatosplenomegaly. There is no tenderness. There is no rigidity, no rebound, no guarding, no CVA tenderness and negative Murphy's sign.  No R flank pain today  Musculoskeletal: She exhibits no edema.  2+ rad pulses No neck stiffness or pain midline or paracervical or trap muscles  Neurological: She is alert and oriented to person, place, and time. She has normal strength. No sensory deficit.  Grip strength intact 5/5 strength BUE Neg tinel/phalen Sensation intact today  Skin: Skin is warm and dry. No rash noted.  Nursing note and vitals reviewed.  Recent Results (from the past 2160 hour(s))  POCT Urinalysis Dipstick (Automated)     Status: None  Collection Time: 07/24/15 12:27 PM  Result Value Ref Range   Color, UA Yellow    Clarity, UA Clear    Glucose, UA Negative    Bilirubin, UA Negative    Ketones, UA Negative    Spec Grav, UA 1.010    Blood, UA Negative    pH, UA 7.0    Protein, UA Negative    Urobilinogen, UA 0.2    Nitrite, UA Negative    Leukocytes, UA Negative Negative      Assessment & Plan:   Problem List Items Addressed This Visit    Right flank  pain    Sounds MSK. Check UA.       Relevant Orders   POCT Urinalysis Dipstick (Automated) (Completed)   Numbness and tingling in right hand    Not consistent with CTS. ?cervical etiology - discussed cervical pillow support.       Microhematuria    Recheck UA today.      Family history of cancer    2 first degree relatives with pancreatic cancer, several other cancers in family. Discussed genetics referral. Discussed GI referral to discuss pancreatic cancer screening.      Chronic insomnia - Primary    Discussed current regimen - ineffective (trazodone 50mg  nightly, tylenol PM, magnesium, intermittent lorazepam). Will trial Lorrin Mais which was effective in the past.           Follow up plan: No Follow-up on file.

## 2015-07-24 NOTE — Assessment & Plan Note (Signed)
Not consistent with CTS. ?cervical etiology - discussed cervical pillow support.

## 2015-07-30 ENCOUNTER — Ambulatory Visit
Admission: RE | Admit: 2015-07-30 | Discharge: 2015-07-30 | Disposition: A | Discharge status: Home / Self Care | Payer: 59 | Source: Ambulatory Visit

## 2015-07-30 DIAGNOSIS — Z1231 Encounter for screening mammogram for malignant neoplasm of breast: Secondary | ICD-10-CM

## 2015-07-31 LAB — HM MAMMOGRAPHY: HM MAMMO: NORMAL

## 2015-08-02 ENCOUNTER — Encounter: Payer: Self-pay | Admitting: *Deleted

## 2015-08-16 ENCOUNTER — Telehealth: Payer: Self-pay

## 2015-08-16 NOTE — Telephone Encounter (Signed)
Pt left v/m; pt was seen 07/24/15 and Lorrin Mais was given; Lorrin Mais will cause pt to sleep for about three hours and then pt wakes up and cannot go back to sleep; pt request different med to help her sleep at night.Please advise. Ruso

## 2015-08-19 MED ORDER — ESZOPICLONE 2 MG PO TABS: 2.0000 mg | ORAL_TABLET | Freq: Every evening | ORAL | Status: DC | PRN

## 2015-08-19 NOTE — Telephone Encounter (Signed)
Rx called in as directed.   

## 2015-08-19 NOTE — Telephone Encounter (Signed)
We can try lunesta - plz phone in Chronic insomnia - trazodone 50mg , tylenol PM, magnesium nightly not effective. Lorazepam, benadryl, melatonin, ambien CR and now [place] not effective.

## 2015-09-25 ENCOUNTER — Other Ambulatory Visit: Payer: Self-pay | Admitting: Family Medicine

## 2015-09-25 DIAGNOSIS — R200 Anesthesia of skin: Secondary | ICD-10-CM

## 2015-09-25 DIAGNOSIS — R202 Paresthesia of skin: Secondary | ICD-10-CM

## 2015-09-25 DIAGNOSIS — E785 Hyperlipidemia, unspecified: Secondary | ICD-10-CM

## 2015-09-25 DIAGNOSIS — R634 Abnormal weight loss: Secondary | ICD-10-CM

## 2015-09-25 DIAGNOSIS — K52832 Lymphocytic colitis: Secondary | ICD-10-CM

## 2015-09-27 ENCOUNTER — Other Ambulatory Visit (INDEPENDENT_AMBULATORY_CARE_PROVIDER_SITE_OTHER): Payer: 59

## 2015-09-27 DIAGNOSIS — R200 Anesthesia of skin: Secondary | ICD-10-CM

## 2015-09-27 DIAGNOSIS — R202 Paresthesia of skin: Secondary | ICD-10-CM

## 2015-09-27 DIAGNOSIS — K52832 Lymphocytic colitis: Secondary | ICD-10-CM

## 2015-09-27 DIAGNOSIS — E785 Hyperlipidemia, unspecified: Secondary | ICD-10-CM | POA: Diagnosis not present

## 2015-09-27 LAB — CBC WITH DIFFERENTIAL/PLATELET
Basophils Absolute: 0 10*3/uL (ref 0.0–0.1)
Basophils Relative: 0.3 % (ref 0.0–3.0)
EOS PCT: 1.1 % (ref 0.0–5.0)
Eosinophils Absolute: 0.1 10*3/uL (ref 0.0–0.7)
HCT: 35.3 % — ABNORMAL LOW (ref 36.0–46.0)
HEMOGLOBIN: 12 g/dL (ref 12.0–15.0)
Lymphocytes Relative: 30.7 % (ref 12.0–46.0)
Lymphs Abs: 1.7 10*3/uL (ref 0.7–4.0)
MCHC: 33.9 g/dL (ref 30.0–36.0)
MCV: 88.9 fl (ref 78.0–100.0)
MONO ABS: 0.5 10*3/uL (ref 0.1–1.0)
Monocytes Relative: 8.5 % (ref 3.0–12.0)
NEUTROS PCT: 59.4 % (ref 43.0–77.0)
Neutro Abs: 3.2 10*3/uL (ref 1.4–7.7)
Platelets: 277 10*3/uL (ref 150.0–400.0)
RBC: 3.97 Mil/uL (ref 3.87–5.11)
RDW: 14.1 % (ref 11.5–15.5)
WBC: 5.4 10*3/uL (ref 4.0–10.5)

## 2015-09-27 LAB — COMPREHENSIVE METABOLIC PANEL
ALBUMIN: 4 g/dL (ref 3.5–5.2)
ALT: 17 U/L (ref 0–35)
AST: 17 U/L (ref 0–37)
Alkaline Phosphatase: 59 U/L (ref 39–117)
BUN: 17 mg/dL (ref 6–23)
CO2: 29 mEq/L (ref 19–32)
CREATININE: 0.98 mg/dL (ref 0.40–1.20)
Calcium: 9.6 mg/dL (ref 8.4–10.5)
Chloride: 105 mEq/L (ref 96–112)
GFR: 60.72 mL/min (ref >60.00)
Glucose, Bld: 93 mg/dL (ref 70–99)
POTASSIUM: 4.3 meq/L (ref 3.5–5.1)
Sodium: 140 mEq/L (ref 135–145)
TOTAL PROTEIN: 7.4 g/dL (ref 6.0–8.3)
Total Bilirubin: 0.6 mg/dL (ref 0.2–1.2)

## 2015-09-27 LAB — LIPID PANEL
CHOLESTEROL: 200 mg/dL (ref 0–200)
HDL: 56.6 mg/dL (ref >39.00)
LDL Cholesterol: 127 mg/dL — ABNORMAL HIGH (ref 0–99)
NonHDL: 143.56
Total CHOL/HDL Ratio: 4
Triglycerides: 85 mg/dL (ref 0.0–149.0)
VLDL: 17 mg/dL (ref 0.0–40.0)

## 2015-09-27 LAB — VITAMIN B12: VITAMIN B 12: 320 pg/mL (ref 211–911)

## 2015-10-04 ENCOUNTER — Other Ambulatory Visit: Payer: Self-pay | Admitting: *Deleted

## 2015-10-04 ENCOUNTER — Ambulatory Visit (INDEPENDENT_AMBULATORY_CARE_PROVIDER_SITE_OTHER): Payer: 59 | Admitting: Family Medicine

## 2015-10-04 ENCOUNTER — Encounter: Payer: Self-pay | Admitting: Radiology

## 2015-10-04 ENCOUNTER — Encounter: Payer: Self-pay | Admitting: Family Medicine

## 2015-10-04 VITALS — BP 122/74 | HR 68 | Temp 98.3°F | Ht 66.0 in | Wt 155.5 lb

## 2015-10-04 DIAGNOSIS — R202 Paresthesia of skin: Secondary | ICD-10-CM | POA: Diagnosis not present

## 2015-10-04 DIAGNOSIS — G4700 Insomnia, unspecified: Secondary | ICD-10-CM | POA: Diagnosis not present

## 2015-10-04 DIAGNOSIS — F329 Major depressive disorder, single episode, unspecified: Secondary | ICD-10-CM

## 2015-10-04 DIAGNOSIS — E785 Hyperlipidemia, unspecified: Secondary | ICD-10-CM | POA: Diagnosis not present

## 2015-10-04 DIAGNOSIS — R200 Anesthesia of skin: Secondary | ICD-10-CM

## 2015-10-04 DIAGNOSIS — F5104 Psychophysiologic insomnia: Secondary | ICD-10-CM

## 2015-10-04 DIAGNOSIS — F32A Depression, unspecified: Secondary | ICD-10-CM

## 2015-10-04 DIAGNOSIS — Z0000 Encounter for general adult medical examination without abnormal findings: Secondary | ICD-10-CM

## 2015-10-04 MED ORDER — CYANOCOBALAMIN 500 MCG PO TABS: 500.0000 ug | ORAL_TABLET | Freq: Every day | ORAL | Status: DC

## 2015-10-04 MED ORDER — LORAZEPAM 1 MG PO TABS: 0.5000 mg | ORAL_TABLET | Freq: Every evening | ORAL | Status: DC | PRN

## 2015-10-04 MED ORDER — ZOLPIDEM TARTRATE 10 MG PO TABS: 10.0000 mg | ORAL_TABLET | Freq: Every day | ORAL | Status: DC

## 2015-10-04 MED ORDER — SERTRALINE HCL 25 MG PO TABS: 25.0000 mg | ORAL_TABLET | Freq: Every day | ORAL | Status: DC

## 2015-10-04 NOTE — Assessment & Plan Note (Signed)
Did not tolerate lunesta. Requests stopping trazodone. Will continue ambien 10mg  nightly + lorazepam 0.5mg  nightly as insomnia regimen. Will work on better depression control and hopeful for improvement of insomnia with this.

## 2015-10-04 NOTE — Progress Notes (Signed)
Pre visit review using our clinic review tool, if applicable. No additional management support is needed unless otherwise documented below in the visit note. 

## 2015-10-04 NOTE — Assessment & Plan Note (Signed)
Low normal B12. Start 569mcg daily.

## 2015-10-04 NOTE — Assessment & Plan Note (Signed)
Preventative protocols reviewed and updated unless pt declined. Discussed healthy diet and lifestyle.  

## 2015-10-04 NOTE — Assessment & Plan Note (Addendum)
Discussed depression/anxiety and treatment. Endorses some SI but no plan.  rec counseling and provided with # to schedule appt with our counselors. rec start sertraline 25mg  daily - discussed possible increased suicidality. pt contracts for safety. RTC 1 mo f/u visit. PHQ9 = 9/27, somewhat difficult to function GAD7 = 20/21

## 2015-10-04 NOTE — Assessment & Plan Note (Signed)
Chronic, stable. Discussed diet changes to improve LDL levels in fmhx CAD.

## 2015-10-04 NOTE — Patient Instructions (Addendum)
I do recommend counseling - call to schedule appointment with Dr Pervis Hocking or Clint Bolder. Start sertraline '25mg'$  daily. If any bad thoughs stop medicine and let us know immediately.  Ok to continue lorazepam and ambien for sleep.  Pass by lab for controlled substance agreement form and UDS. Return in 1 month for follow up.   Health Maintenance, Female Adopting a healthy lifestyle and getting preventive care can go a long way to promote health and wellness. Talk with your health care provider about what schedule of regular examinations is right for you. This is a good chance for you to check in with your provider about disease prevention and staying healthy. In between checkups, there are plenty of things you can do on your own. Experts have done a lot of research about which lifestyle changes and preventive measures are most likely to keep you healthy. Ask your health care provider for more information. WEIGHT AND DIET  Eat a healthy diet  Be sure to include plenty of vegetables, fruits, low-fat dairy products, and lean protein.  Do not eat a lot of foods high in solid fats, added sugars, or salt.  Get regular exercise. This is one of the most important things you can do for your health.  Most adults should exercise for at least 150 minutes each week. The exercise should increase your heart rate and make you sweat (moderate-intensity exercise).  Most adults should also do strengthening exercises at least twice a week. This is in addition to the moderate-intensity exercise.  Maintain a healthy weight  Body mass index (BMI) is a measurement that can be used to identify possible weight problems. It estimates body fat based on height and weight. Your health care provider can help determine your BMI and help you achieve or maintain a healthy weight.  For females 58 years of age and older:   A BMI below 18.5 is considered underweight.  A BMI of 18.5 to 24.9 is normal.  A BMI of 25 to  29.9 is considered overweight.  A BMI of 30 and above is considered obese.  Watch levels of cholesterol and blood lipids  You should start having your blood tested for lipids and cholesterol at 64 years of age, then have this test every 5 years.  You may need to have your cholesterol levels checked more often if:  Your lipid or cholesterol levels are high.  You are older than 64 years of age.  You are at high risk for heart disease.  CANCER SCREENING   Lung Cancer  Lung cancer screening is recommended for adults 40-58 years old who are at high risk for lung cancer because of a history of smoking.  A yearly low-dose CT scan of the lungs is recommended for people who:  Currently smoke.  Have quit within the past 15 years.  Have at least a 30-pack-year history of smoking. A pack year is smoking an average of one pack of cigarettes a day for 1 year.  Yearly screening should continue until it has been 15 years since you quit.  Yearly screening should stop if you develop a health problem that would prevent you from having lung cancer treatment.  Breast Cancer  Practice breast self-awareness. This means understanding how your breasts normally appear and feel.  It also means doing regular breast self-exams. Let your health care provider know about any changes, no matter how small.  If you are in your 20s or 30s, you should have a clinical breast  exam (CBE) by a health care provider every 1-3 years as part of a regular health exam.  If you are 35 or older, have a CBE every year. Also consider having a breast X-ray (mammogram) every year.  If you have a family history of breast cancer, talk to your health care provider about genetic screening.  If you are at high risk for breast cancer, talk to your health care provider about having an MRI and a mammogram every year.  Breast cancer gene (BRCA) assessment is recommended for women who have family members with BRCA-related  cancers. BRCA-related cancers include:  Breast.  Ovarian.  Tubal.  Peritoneal cancers.  Results of the assessment will determine the need for genetic counseling and BRCA1 and BRCA2 testing. Cervical Cancer Your health care provider may recommend that you be screened regularly for cancer of the pelvic organs (ovaries, uterus, and vagina). This screening involves a pelvic examination, including checking for microscopic changes to the surface of your cervix (Pap test). You may be encouraged to have this screening done every 3 years, beginning at age 73.  For women ages 43-65, health care providers may recommend pelvic exams and Pap testing every 3 years, or they may recommend the Pap and pelvic exam, combined with testing for human papilloma virus (HPV), every 5 years. Some types of HPV increase your risk of cervical cancer. Testing for HPV may also be done on women of any age with unclear Pap test results.  Other health care providers may not recommend any screening for nonpregnant women who are considered low risk for pelvic cancer and who do not have symptoms. Ask your health care provider if a screening pelvic exam is right for you.  If you have had past treatment for cervical cancer or a condition that could lead to cancer, you need Pap tests and screening for cancer for at least 20 years after your treatment. If Pap tests have been discontinued, your risk factors (such as having a new sexual partner) need to be reassessed to determine if screening should resume. Some women have medical problems that increase the chance of getting cervical cancer. In these cases, your health care provider may recommend more frequent screening and Pap tests. Colorectal Cancer  This type of cancer can be detected and often prevented.  Routine colorectal cancer screening usually begins at 63 years of age and continues through 64 years of age.  Your health care provider may recommend screening at an earlier  age if you have risk factors for colon cancer.  Your health care provider may also recommend using home test kits to check for hidden blood in the stool.  A small camera at the end of a tube can be used to examine your colon directly (sigmoidoscopy or colonoscopy). This is done to check for the earliest forms of colorectal cancer.  Routine screening usually begins at age 41.  Direct examination of the colon should be repeated every 5-10 years through 64 years of age. However, you may need to be screened more often if early forms of precancerous polyps or small growths are found. Skin Cancer  Check your skin from head to toe regularly.  Tell your health care provider about any new moles or changes in moles, especially if there is a change in a mole's shape or color.  Also tell your health care provider if you have a mole that is larger than the size of a pencil eraser.  Always use sunscreen. Apply sunscreen liberally and repeatedly  throughout the day.  Protect yourself by wearing Brandenberger sleeves, pants, a wide-brimmed hat, and sunglasses whenever you are outside. HEART DISEASE, DIABETES, AND HIGH BLOOD PRESSURE   High blood pressure causes heart disease and increases the risk of stroke. High blood pressure is more likely to develop in:  People who have blood pressure in the high end of the normal range (130-139/85-89 mm Hg).  People who are overweight or obese.  People who are African American.  If you are 70-80 years of age, have your blood pressure checked every 3-5 years. If you are 29 years of age or older, have your blood pressure checked every year. You should have your blood pressure measured twice--once when you are at a hospital or clinic, and once when you are not at a hospital or clinic. Record the average of the two measurements. To check your blood pressure when you are not at a hospital or clinic, you can use:  An automated blood pressure machine at a pharmacy.  A home  blood pressure monitor.  If you are between 56 years and 47 years old, ask your health care provider if you should take aspirin to prevent strokes.  Have regular diabetes screenings. This involves taking a blood sample to check your fasting blood sugar level.  If you are at a normal weight and have a low risk for diabetes, have this test once every three years after 64 years of age.  If you are overweight and have a high risk for diabetes, consider being tested at a younger age or more often. PREVENTING INFECTION  Hepatitis B  If you have a higher risk for hepatitis B, you should be screened for this virus. You are considered at high risk for hepatitis B if:  You were born in a country where hepatitis B is common. Ask your health care provider which countries are considered high risk.  Your parents were born in a high-risk country, and you have not been immunized against hepatitis B (hepatitis B vaccine).  You have HIV or AIDS.  You use needles to inject street drugs.  You live with someone who has hepatitis B.  You have had sex with someone who has hepatitis B.  You get hemodialysis treatment.  You take certain medicines for conditions, including cancer, organ transplantation, and autoimmune conditions. Hepatitis C  Blood testing is recommended for:  Everyone born from [birth year] through 1964.  Anyone with known risk factors for hepatitis C. Sexually transmitted infections (STIs)  You should be screened for sexually transmitted infections (STIs) including gonorrhea and chlamydia if:  You are sexually active and are younger than 64 years of age.  You are older than 64 years of age and your health care provider tells you that you are at risk for this type of infection.  Your sexual activity has changed since you were last screened and you are at an increased risk for chlamydia or gonorrhea. Ask your health care provider if you are at risk.  If you do not have HIV, but are at  risk, it may be recommended that you take a prescription medicine daily to prevent HIV infection. This is called pre-exposure prophylaxis (PrEP). You are considered at risk if:  You are sexually active and do not regularly use condoms or know the HIV status of your partner(s).  You take drugs by injection.  You are sexually active with a partner who has HIV. Talk with your health care provider about whether you are at high  risk of being infected with HIV. If you choose to begin PrEP, you should first be tested for HIV. You should then be tested every 3 months for as [place] as you are taking PrEP.  PREGNANCY   If you are premenopausal and you may become pregnant, ask your health care provider about preconception counseling.  If you may become pregnant, take 400 to 800 micrograms (mcg) of folic acid every day.  If you want to prevent pregnancy, talk to your health care provider about birth control (contraception). OSTEOPOROSIS AND MENOPAUSE   Osteoporosis is a disease in which the bones lose minerals and strength with aging. This can result in serious bone fractures. Your risk for osteoporosis can be identified using a bone density scan.  If you are 12 years of age or older, or if you are at risk for osteoporosis and fractures, ask your health care provider if you should be screened.  Ask your health care provider whether you should take a calcium or vitamin D supplement to lower your risk for osteoporosis.  Menopause may have certain physical symptoms and risks.  Hormone replacement therapy may reduce some of these symptoms and risks. Talk to your health care provider about whether hormone replacement therapy is right for you.  HOME CARE INSTRUCTIONS   Schedule regular health, dental, and eye exams.  Stay current with your immunizations.   Do not use any tobacco products including cigarettes, chewing tobacco, or electronic cigarettes.  If you are pregnant, do not drink alcohol.  If  you are breastfeeding, limit how much and how often you drink alcohol.  Limit alcohol intake to no more than 1 drink per day for nonpregnant women. One drink equals 12 ounces of beer, 5 ounces of wine, or 1 ounces of hard liquor.  Do not use street drugs.  Do not share needles.  Ask your health care provider for help if you need support or information about quitting drugs.  Tell your health care provider if you often feel depressed.  Tell your health care provider if you have ever been abused or do not feel safe at home.   This information is not intended to replace advice given to you by your health care provider. Make sure you discuss any questions you have with your health care provider.   Document Released: 01/05/2011 Document Revised: 07/13/2014 Document Reviewed: 05/24/2013 Elsevier Interactive Patient Education [employer].

## 2015-10-04 NOTE — Progress Notes (Signed)
BP 122/74 mmHg  Pulse 68  Temp(Src) 98.3 F (36.8 C) (Oral)  Ht 5\' 6"  (1.676 m)  Wt 155 lb 8 oz (70.534 kg)  BMI 25.11 kg/m2   CC: CPE  Subjective:    Patient ID: Shannon Kelly, female    DOB: [birth year], 65 y.o.   MRN: [id number]  HPI: Shannon Kelly is a 65 y.o. female presenting on 10/04/2015 for Annual Exam   Increased stress over last few months. Lost brother and father to pancreatic cancer late last year. + SI without plan. No HI. Has tried cymbalta in the past as well as zoloft.   Chronic insomnia - tried and failed multiple medications up to now. Latest trial lunesta last month - tried 1 pill which made everything taste terrible. Currently stable with ambien and 1/2 lorazepam. (Previous trials of trazodone 50mg  nightly, tylenol PM, magnesium, intermittent lorazepam and ambien ineffective in the past).   Chronic anxiety, denies depression.   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 Wyvonne Lenz) 2013. S/p hysterectomy. Pelvic exam today.  Pap smear WNL 10/2011 ([facility]).  Mammogram - 07/2015 WNL.  DEXA 2013 - WNL  Flu shot yearly Tdap - 08/2013 zostavax - 05/2012 Seat belt use discussed. Sunscreen use discussed. no changing moles on skin. Pending derm appt 11/2015  Lives with husband, married, 1 son Had been living in [place], return to [facility] fall 2014 Occupation: Retired, prior worked at [employer]: [employer] Activity: plays with dog, outdoor walking, joined gym  Diet: good water, fruits/vegetables daily  Relevant past medical, surgical, family and social history reviewed and updated as indicated. Interim medical history since our last visit reviewed. Allergies and medications reviewed and updated. Current Outpatient Prescriptions on File Prior to Visit  Medication Sig  . celecoxib (CELEBREX) 200 MG capsule TAKE 1 CAPSULE BY MOUTH DAILY WITH MEALS.  (Patient not taking: Reported on 10/04/2015)  . EPINEPHrine (EPIPEN 2-PAK) 0.3 mg/0.3 mL IJ SOAJ injection Inject 0.3 mLs (0.3 mg total) into the muscle once. (Patient not taking: Reported on 10/04/2015)  . Magnesium 400 MG TABS Take 1 tablet by mouth daily. Reported on 10/04/2015   No current facility-administered medications on file prior to visit.    Review of Systems  Constitutional: Negative for fever, chills, activity change, appetite change, fatigue and unexpected weight change.  HENT: Negative for hearing loss.   Eyes: Positive for visual disturbance (pending cataract surgery).  Respiratory: Negative for cough, chest tightness, shortness of breath and wheezing.   Cardiovascular: Negative for chest pain, palpitations and leg swelling.  Gastrointestinal: Negative for nausea, vomiting, abdominal pain, diarrhea, constipation, blood in stool and abdominal distention.  Genitourinary: Negative for hematuria and difficulty urinating.  Musculoskeletal: Negative for myalgias, arthralgias and neck pain.  Skin: Negative for rash.  Neurological: Positive for dizziness. Negative for seizures, syncope and headaches.  Hematological: Negative for adenopathy. Does not bruise/bleed easily.  Psychiatric/Behavioral: Positive for dysphoric mood. The patient is nervous/anxious.    Per HPI unless specifically indicated in ROS section     Objective:    BP 122/74 mmHg  Pulse 68  Temp(Src) 98.3 F (36.8 C) (Oral)  Ht 5\' 6"  (1.676 m)  Wt 155 lb 8 oz (70.534 kg)  BMI 25.11 kg/m2  Wt Readings from Last 3 Encounters:  10/04/15 155 lb 8 oz (70.534 kg)  07/24/15 156 lb (70.761 kg)  10/02/14 158 lb 1.9 oz (71.723 kg)  Physical Exam  Constitutional: She is oriented to person, place, and time. She appears well-developed and well-nourished. No distress.  HENT:  Head: Normocephalic and atraumatic.  Right Ear: Hearing, tympanic membrane, external ear and ear canal normal.  Left Ear: Hearing, tympanic  membrane, external ear and ear canal normal.  Nose: Nose normal.  Mouth/Throat: Uvula is midline, oropharynx is clear and moist and mucous membranes are normal. No oropharyngeal exudate, posterior oropharyngeal edema or posterior oropharyngeal erythema.  Eyes: Conjunctivae and EOM are normal. Pupils are equal, round, and reactive to light. No scleral icterus.  Neck: Normal range of motion. Neck supple. Carotid bruit is not present. No thyromegaly present.  Cardiovascular: Normal rate, regular rhythm, normal heart sounds and intact distal pulses.   No murmur heard. Pulses:      Radial pulses are 2+ on the right side, and 2+ on the left side.  Pulmonary/Chest: Effort normal and breath sounds normal. No respiratory distress. She has no wheezes. She has no rales.  Abdominal: Soft. Bowel sounds are normal. She exhibits no distension and no mass. There is no tenderness. There is no rebound and no guarding.  Genitourinary: Vagina normal. Pelvic exam was performed with patient supine. There is no rash, tenderness, lesion or injury on the right labia. There is no rash, tenderness, lesion or injury on the left labia. Right adnexum displays no mass, no tenderness and no fullness. Left adnexum displays no mass, no tenderness and no fullness.  Absent uterus  Musculoskeletal: Normal range of motion. She exhibits no edema.  Lymphadenopathy:    She has no cervical adenopathy.  Neurological: She is alert and oriented to person, place, and time.  CN grossly intact, station and gait intact  Skin: Skin is warm and dry. No rash noted.  Psychiatric: She has a normal mood and affect. Her behavior is normal. Judgment and thought content normal.  Nursing note and vitals reviewed.  Results for orders placed or performed in visit on 09/27/15  Lipid panel  Result Value Ref Range   Cholesterol 200 0 - 200 mg/dL   Triglycerides 85.0 0.0 - 149.0 mg/dL   HDL 56.60 >39.00 mg/dL   VLDL 17.0 0.0 - 40.0 mg/dL   LDL  Cholesterol 127 (H) 0 - 99 mg/dL   Total CHOL/HDL Ratio 4    NonHDL 143.56   Comprehensive metabolic panel  Result Value Ref Range   Sodium 140 135 - 145 mEq/L   Potassium 4.3 3.5 - 5.1 mEq/L   Chloride 105 96 - 112 mEq/L   CO2 29 19 - 32 mEq/L   Glucose, Bld 93 70 - 99 mg/dL   BUN 17 6 - 23 mg/dL   Creatinine, Ser 0.98 0.40 - 1.20 mg/dL   Total Bilirubin 0.6 0.2 - 1.2 mg/dL   Alkaline Phosphatase 59 39 - 117 U/L   AST 17 0 - 37 U/L   ALT 17 0 - 35 U/L   Total Protein 7.4 6.0 - 8.3 g/dL   Albumin 4.0 3.5 - 5.2 g/dL   Calcium 9.6 8.4 - 10.5 mg/dL   GFR 60.72 >60.00 mL/min  CBC with Differential/Platelet  Result Value Ref Range   WBC 5.4 4.0 - 10.5 K/uL   RBC 3.97 3.87 - 5.11 Mil/uL   Hemoglobin 12.0 12.0 - 15.0 g/dL   HCT 35.3 (L) 36.0 - 46.0 %   MCV 88.9 78.0 - 100.0 fl   MCHC 33.9 30.0 - 36.0 g/dL   RDW 14.1 11.5 - 15.5 %  Platelets 277.0 150.0 - 400.0 K/uL   Neutrophils Relative % 59.4 43.0 - 77.0 %   Lymphocytes Relative 30.7 12.0 - 46.0 %   Monocytes Relative 8.5 3.0 - 12.0 %   Eosinophils Relative 1.1 0.0 - 5.0 %   Basophils Relative 0.3 0.0 - 3.0 %   Neutro Abs 3.2 1.4 - 7.7 K/uL   Lymphs Abs 1.7 0.7 - 4.0 K/uL   Monocytes Absolute 0.5 0.1 - 1.0 K/uL   Eosinophils Absolute 0.1 0.0 - 0.7 K/uL   Basophils Absolute 0.0 0.0 - 0.1 K/uL  Vitamin B12  Result Value Ref Range   Vitamin B-12 320 211 - 911 pg/mL      Assessment & Plan:   Problem List Items Addressed This Visit    HLD (hyperlipidemia)    Chronic, stable. Discussed diet changes to improve LDL levels in fmhx CAD.       Depression with anxiety    Discussed depression/anxiety and treatment. Endorses some SI but no plan.  rec counseling and provided with # to schedule appt with our counselors. rec start sertraline 25mg  daily - discussed possible increased suicidality. pt contracts for safety. RTC 1 mo f/u visit. PHQ9 = 9/27, somewhat difficult to function GAD7 = 20/21      Chronic insomnia    Did not  tolerate lunesta. Requests stopping trazodone. Will continue ambien 10mg  nightly + lorazepam 0.5mg  nightly as insomnia regimen. Will work on better depression control and hopeful for improvement of insomnia with this.       Health care maintenance - Primary    Preventative protocols reviewed and updated unless pt declined. Discussed healthy diet and lifestyle.       Numbness and tingling in right hand    Low normal B12. Start 546mcg daily.          Follow up plan: Return in about 4 weeks (around 11/01/2015), or if symptoms worsen or fail to improve, for follow up visit.

## 2015-10-05 ENCOUNTER — Other Ambulatory Visit: Payer: Self-pay | Admitting: Family Medicine

## 2015-10-07 NOTE — Telephone Encounter (Signed)
Please see Mychart message.

## 2015-10-08 MED ORDER — LORAZEPAM 1 MG PO TABS: 1.0000 mg | ORAL_TABLET | Freq: Two times a day (BID) | ORAL | Status: DC | PRN

## 2015-10-08 MED ORDER — SERTRALINE HCL 25 MG PO TABS: 25.0000 mg | ORAL_TABLET | Freq: Every day | ORAL | Status: DC

## 2015-10-08 NOTE — Telephone Encounter (Signed)
plz phone in with new sig.

## 2015-10-08 NOTE — Telephone Encounter (Signed)
Rx called in as directed and message left notifying patient. 

## 2015-10-16 ENCOUNTER — Ambulatory Visit (INDEPENDENT_AMBULATORY_CARE_PROVIDER_SITE_OTHER): Payer: 59 | Admitting: Psychology

## 2015-10-16 DIAGNOSIS — F4321 Adjustment disorder with depressed mood: Secondary | ICD-10-CM | POA: Diagnosis not present

## 2015-11-04 ENCOUNTER — Encounter: Payer: Self-pay | Admitting: Family Medicine

## 2015-11-04 ENCOUNTER — Ambulatory Visit (INDEPENDENT_AMBULATORY_CARE_PROVIDER_SITE_OTHER): Payer: 59 | Admitting: Family Medicine

## 2015-11-04 VITALS — BP 126/82 | HR 88 | Temp 98.7°F | Wt 152.2 lb

## 2015-11-04 DIAGNOSIS — F418 Other specified anxiety disorders: Secondary | ICD-10-CM

## 2015-11-04 DIAGNOSIS — Z7189 Other specified counseling: Secondary | ICD-10-CM

## 2015-11-04 DIAGNOSIS — Z23 Encounter for immunization: Secondary | ICD-10-CM | POA: Diagnosis not present

## 2015-11-04 DIAGNOSIS — Z7184 Encounter for health counseling related to travel: Secondary | ICD-10-CM

## 2015-11-04 DIAGNOSIS — G4700 Insomnia, unspecified: Secondary | ICD-10-CM

## 2015-11-04 DIAGNOSIS — F5104 Psychophysiologic insomnia: Secondary | ICD-10-CM

## 2015-11-04 MED ORDER — TYPHOID VACCINE PO CPDR: 1.0000 | DELAYED_RELEASE_CAPSULE | ORAL | Status: DC

## 2015-11-04 MED ORDER — SCOPOLAMINE 1 MG/3DAYS TD PT72: 1.0000 | MEDICATED_PATCH | TRANSDERMAL | Status: DC

## 2015-11-04 NOTE — Assessment & Plan Note (Signed)
Stable period. Did not tolerate zoloft. Doing well.

## 2015-11-04 NOTE — Progress Notes (Signed)
BP 126/82 mmHg  Pulse 88  Temp(Src) 98.7 F (37.1 C) (Oral)  Wt 152 lb 4 oz (69.06 kg)   CC: trip planning  Subjective:    Patient ID: Shannon Kelly, female    DOB: 1951-01-02, 65 y.o.   MRN: [id number]  HPI: Shannon Kelly is a 65 y.o. female presenting on 11/04/2015 for Follow-up   See prior note for details. Briefly, for worsening anxiety with bereavement disorder (brother and father deceased from pancreatic cancer) we started sertraline 25mg  daily. Zoloft caused dififculty with concentration and word finding problems. Also known chronic insomnia - this is actually better. No lorazepam in last week. PRN ambien, sleeping better. Both sleep initiation and maintenance insomnia.   Upcoming cruise to [place] in 3 weeks - asks about need for vaccinations.   Requests transdermal scopolamine patch for motion sickness.   H/o lymphocytic colitis - no trouble in last 2 yrs   Relevant past medical, surgical, family and social history reviewed and updated as indicated. Interim medical history since our last visit reviewed. Allergies and medications reviewed and updated. Current Outpatient Prescriptions on File Prior to Visit  Medication Sig  . celecoxib (CELEBREX) 200 MG capsule TAKE 1 CAPSULE BY MOUTH DAILY WITH MEALS. (Patient taking differently: TAKE 1 CAPSULE BY MOUTH PRN WITH MEALS.)  . cyanocobalamin 500 MCG tablet Take 1 tablet (500 mcg total) by mouth daily.  Marland Kitchen zolpidem (AMBIEN) 10 MG tablet Take 1 tablet (10 mg total) by mouth at bedtime.  Marland Kitchen EPINEPHrine (EPIPEN 2-PAK) 0.3 mg/0.3 mL IJ SOAJ injection Inject 0.3 mLs (0.3 mg total) into the muscle once. (Patient not taking: Reported on 10/04/2015)  . LORazepam (ATIVAN) 1 MG tablet Take 1 tablet (1 mg total) by mouth 2 (two) times daily as needed for anxiety or sleep. (Patient not taking: Reported on 11/04/2015)   No current facility-administered medications on file prior to visit.    Review of Systems Per HPI unless specifically  indicated in ROS section     Objective:    BP 126/82 mmHg  Pulse 88  Temp(Src) 98.7 F (37.1 C) (Oral)  Wt 152 lb 4 oz (69.06 kg)  Wt Readings from Last 3 Encounters:  11/04/15 152 lb 4 oz (69.06 kg)  10/04/15 155 lb 8 oz (70.534 kg)  07/24/15 156 lb (70.761 kg)    Physical Exam  Constitutional: She appears well-developed and well-nourished. No distress.  HENT:  Mouth/Throat: Oropharynx is clear and moist. No oropharyngeal exudate.  Cardiovascular: Normal rate, regular rhythm, normal heart sounds and intact distal pulses.   No murmur heard. Pulmonary/Chest: Effort normal and breath sounds normal. No respiratory distress. She has no wheezes. She has no rales.  Musculoskeletal: She exhibits no edema.  Skin: Skin is warm and dry. No rash noted.  Psychiatric: She has a normal mood and affect.  Nursing note and vitals reviewed.      Assessment & Plan:   Problem List Items Addressed This Visit    Depression with anxiety - Primary    Stable period. Did not tolerate zoloft. Doing well.      Chronic insomnia    Doing well on ambien 10mg  prn. Has not used lorazepam in 1 wk. Continue current regimen.       Travel advice encounter    Reviewed CDC recommendations for [place] travel. Discussed mosquito bite prevention measures as well as food hygiene/safety. Provided with first Hep A/B shot in series Provided with Rx for vivotif - should be ok even in  setting of h/o lymphocytic colitis.  rx for transdermal scopolamine patch sent to pharmacy.       Other Visit Diagnoses    Need for hepatitis A and B vaccination        Relevant Orders    Hepatitis A hepatitis B combined vaccine IM (Completed)        Follow up plan: Return as needed.  Ria Bush, MD

## 2015-11-04 NOTE — Assessment & Plan Note (Signed)
Doing well on ambien 10mg  prn. Has not used lorazepam in 1 wk. Continue current regimen.

## 2015-11-04 NOTE — Patient Instructions (Signed)
I'm glad you're doing better! I've sent in scopolamine patch to pharmacy I've sent in typhoid vaccine to pharmacy (vivotif) First Hep A/B combo shot today - may return after trip to complete series.

## 2015-11-04 NOTE — Progress Notes (Signed)
Pre visit review using our clinic review tool, if applicable. No additional management support is needed unless otherwise documented below in the visit note. 

## 2015-11-04 NOTE — Assessment & Plan Note (Addendum)
Reviewed CDC recommendations for [place] travel. Discussed mosquito bite prevention measures as well as food hygiene/safety. Provided with first Hep A/B shot in series Provided with Rx for vivotif - should be ok even in setting of h/o lymphocytic colitis.  rx for transdermal scopolamine patch sent to pharmacy.

## 2015-11-18 ENCOUNTER — Encounter: Payer: Self-pay | Admitting: Family Medicine

## 2015-12-13 ENCOUNTER — Ambulatory Visit: Payer: Self-pay

## 2016-01-08 ENCOUNTER — Other Ambulatory Visit: Payer: Self-pay

## 2016-01-08 ENCOUNTER — Ambulatory Visit (INDEPENDENT_AMBULATORY_CARE_PROVIDER_SITE_OTHER): Payer: 59

## 2016-01-08 DIAGNOSIS — Z23 Encounter for immunization: Secondary | ICD-10-CM

## 2016-01-08 MED ORDER — LORAZEPAM 1 MG PO TABS: 1.0000 mg | ORAL_TABLET | Freq: Two times a day (BID) | ORAL | Status: DC | PRN

## 2016-01-08 MED ORDER — ESCITALOPRAM OXALATE 10 MG PO TABS: 10.0000 mg | ORAL_TABLET | Freq: Every day | ORAL | Status: DC

## 2016-01-08 MED ORDER — CELECOXIB 200 MG PO CAPS: 200.0000 mg | ORAL_CAPSULE | Freq: Every day | ORAL | Status: DC

## 2016-01-08 NOTE — Telephone Encounter (Signed)
Rx called in as directed.   

## 2016-01-08 NOTE — Telephone Encounter (Signed)
Plz phone in lorazepam.  Will fill celebrex and lexapro per last office note.

## 2016-01-08 NOTE — Telephone Encounter (Signed)
Pt left note requesting refill celebrex (last refilled # 30 x 3 on 10/30/14) and lorazepam (last refilled # 60 on 10/08/15), pt last seen 11/04/15. Pt also request new rx for Lexapro; pt said Trazodone and Zoloft not effective. Pt said Dr Darnell Level aware of her situation and was last seen 11/04/15 and does not need to schedule appt to get started on Lexapro.Please advise. Pt request cb. [place].

## 2016-01-30 ENCOUNTER — Other Ambulatory Visit: Payer: Self-pay | Admitting: *Deleted

## 2016-01-30 NOTE — Telephone Encounter (Signed)
Ok to refill 

## 2016-01-31 NOTE — Telephone Encounter (Signed)
Ok to refill? Last filled 10/04/15 #30 3RF and last OV 11/04/15

## 2016-01-31 NOTE — Telephone Encounter (Signed)
Patient is calling to find out when her medication will be called in to pharmacy.  Patient will run out of medication on Sunday.  Please call patient when medication is called in to pharmacy.  Patient can be reached at [phone].

## 2016-02-01 MED ORDER — ZOLPIDEM TARTRATE 10 MG PO TABS: 10.0000 mg | ORAL_TABLET | Freq: Every day | ORAL | 3 refills | Status: DC

## 2016-02-01 NOTE — Telephone Encounter (Signed)
Medication phoned in.

## 2016-03-31 ENCOUNTER — Other Ambulatory Visit: Payer: Self-pay | Admitting: *Deleted

## 2016-03-31 NOTE — Telephone Encounter (Signed)
Ok to refill? Last filled 01/08/16 #60 0RF

## 2016-04-02 MED ORDER — LORAZEPAM 1 MG PO TABS: 1.0000 mg | ORAL_TABLET | Freq: Two times a day (BID) | ORAL | 0 refills | Status: DC | PRN

## 2016-04-02 NOTE — Telephone Encounter (Signed)
Rx called in as directed.   

## 2016-04-02 NOTE — Telephone Encounter (Signed)
plz phone in. 

## 2016-05-06 ENCOUNTER — Ambulatory Visit (INDEPENDENT_AMBULATORY_CARE_PROVIDER_SITE_OTHER): Payer: 59

## 2016-05-06 DIAGNOSIS — Z23 Encounter for immunization: Secondary | ICD-10-CM

## 2016-05-14 ENCOUNTER — Other Ambulatory Visit: Payer: Self-pay | Admitting: Family Medicine

## 2016-06-08 ENCOUNTER — Other Ambulatory Visit: Payer: Self-pay | Admitting: *Deleted

## 2016-06-08 MED ORDER — LORAZEPAM 1 MG PO TABS: 1.0000 mg | ORAL_TABLET | Freq: Two times a day (BID) | ORAL | 0 refills | Status: DC | PRN

## 2016-06-08 MED ORDER — ZOLPIDEM TARTRATE 10 MG PO TABS: 10.0000 mg | ORAL_TABLET | Freq: Every day | ORAL | 3 refills | Status: DC

## 2016-06-08 NOTE — Telephone Encounter (Signed)
Ok to refill? Lorazepam last filled 04/02/16 #60 0RF; Ambien 02/01/16 3RF

## 2016-06-08 NOTE — Telephone Encounter (Signed)
plz phone in. 

## 2016-06-09 NOTE — Telephone Encounter (Signed)
Rx's called in as directed.  

## 2016-07-08 ENCOUNTER — Other Ambulatory Visit: Payer: Self-pay | Admitting: Family Medicine

## 2016-07-08 DIAGNOSIS — Z1231 Encounter for screening mammogram for malignant neoplasm of breast: Secondary | ICD-10-CM

## 2016-07-21 ENCOUNTER — Other Ambulatory Visit: Payer: Self-pay | Admitting: Family Medicine

## 2016-07-21 NOTE — Telephone Encounter (Signed)
Ok to refill? Last filled 06/08/16 #60 0RF

## 2016-07-21 NOTE — Telephone Encounter (Signed)
plz phone in. 

## 2016-07-21 NOTE — Telephone Encounter (Signed)
Rx called in as directed.   

## 2016-08-10 ENCOUNTER — Ambulatory Visit
Admission: RE | Admit: 2016-08-10 | Discharge: 2016-08-10 | Disposition: A | Discharge status: Home / Self Care | Payer: 59 | Source: Ambulatory Visit | Attending: Family Medicine | Admitting: Family Medicine

## 2016-08-10 DIAGNOSIS — Z1231 Encounter for screening mammogram for malignant neoplasm of breast: Secondary | ICD-10-CM

## 2016-08-10 LAB — HM MAMMOGRAPHY

## 2016-08-11 ENCOUNTER — Encounter: Payer: Self-pay | Admitting: *Deleted

## 2016-08-14 ENCOUNTER — Ambulatory Visit (INDEPENDENT_AMBULATORY_CARE_PROVIDER_SITE_OTHER): Payer: 59 | Admitting: Family Medicine

## 2016-08-14 ENCOUNTER — Encounter: Payer: Self-pay | Admitting: Family Medicine

## 2016-08-14 VITALS — BP 126/86 | HR 82 | Temp 100.1°F | Wt 160.5 lb

## 2016-08-14 DIAGNOSIS — J111 Influenza due to unidentified influenza virus with other respiratory manifestations: Secondary | ICD-10-CM | POA: Diagnosis not present

## 2016-08-14 MED ORDER — BENZONATATE 100 MG PO CAPS: 100.0000 mg | ORAL_CAPSULE | Freq: Three times a day (TID) | ORAL | 0 refills | Status: DC | PRN

## 2016-08-14 MED ORDER — OSELTAMIVIR PHOSPHATE 75 MG PO CAPS: 75.0000 mg | ORAL_CAPSULE | Freq: Two times a day (BID) | ORAL | 0 refills | Status: DC

## 2016-08-14 MED ORDER — CELECOXIB 200 MG PO CAPS: 200.0000 mg | ORAL_CAPSULE | Freq: Every day | ORAL | 1 refills | Status: DC

## 2016-08-14 NOTE — Patient Instructions (Addendum)
I think you have the flu - treat with tamiflu. May use ibuprofen 400mg  with meals for fever as needed - but old celebrex if used.  Push fluids and rest.  Tessalon perls for cough  Influenza, Adult Influenza, more commonly known as "the flu," is a viral infection that primarily affects the respiratory tract. The respiratory tract includes organs that help you breathe, such as the lungs, nose, and throat. The flu causes many common cold symptoms, as well as a high fever and body aches. The flu spreads easily from person to person (is contagious). Getting a flu shot (influenza vaccination) every year is the best way to prevent influenza. What are the causes? Influenza is caused by a virus. You can catch the virus by:  Breathing in droplets from an infected person's cough or sneeze.  Touching something that was recently contaminated with the virus and then touching your mouth, nose, or eyes. What increases the risk? The following factors may make you more likely to get the flu:  Not cleaning your hands frequently with soap and water or alcohol-based hand sanitizer.  Having close contact with many people during cold and flu season.  Touching your mouth, eyes, or nose without washing or sanitizing your hands first.  Not drinking enough fluids or not eating a healthy diet.  Not getting enough sleep or exercise.  Being under a high amount of stress.  Not getting a yearly (annual) flu shot. You may be at a higher risk of complications from the flu, such as a severe lung infection (pneumonia), if you:  Are over the age of 87.  Are pregnant.  Have a weakened disease-fighting system (immune system). You may have a weakened immune system if you:  Have HIV or AIDS.  Are undergoing chemotherapy.  Aretaking medicines that reduce the activity of (suppress) the immune system.  Have a long-term (chronic) illness, such as heart disease, kidney disease, diabetes, or lung disease.  Have a  liver disorder.  Are obese.  Have anemia. What are the signs or symptoms? Symptoms of this condition typically last 4-10 days and may include:  Fever.  Chills.  Headache, body aches, or muscle aches.  Sore throat.  Cough.  Runny or congested nose.  Chest discomfort and cough.  Poor appetite.  Weakness or tiredness (fatigue).  Dizziness.  Nausea or vomiting. How is this diagnosed? This condition may be diagnosed based on your medical history and a physical exam. Your health care provider may do a nose or throat swab test to confirm the diagnosis. How is this treated? If influenza is detected early, you can be treated with antiviral medicine that can reduce the length of your illness and the severity of your symptoms. This medicine may be given by mouth (orally) or through an IV tube that is inserted in one of your veins. The goal of treatment is to relieve symptoms by taking care of yourself at home. This may include taking over-the-counter medicines, drinking plenty of fluids, and adding humidity to the air in your home. In some cases, influenza goes away on its own. Severe influenza or complications from influenza may be treated in a hospital. Follow these instructions at home:  Take over-the-counter and prescription medicines only as told by your health care provider.  Use a cool mist humidifier to add humidity to the air in your home. This can make breathing easier.  Rest as needed.  Drink enough fluid to keep your urine clear or pale yellow.  Cover your  mouth and nose when you cough or sneeze.  Wash your hands with soap and water often, especially after you cough or sneeze. If soap and water are not available, use hand sanitizer.  Stay home from work or school as told by your health care provider. Unless you are visiting your health care provider, try to avoid leaving home until your fever has been gone for 24 hours without the use of medicine.  Keep all  follow-up visits as told by your health care provider. This is important. How is this prevented?  Getting an annual flu shot is the best way to avoid getting the flu. You may get the flu shot in late summer, fall, or winter. Ask your health care provider when you should get your flu shot.  Wash your hands often or use hand sanitizer often.  Avoid contact with people who are sick during cold and flu season.  Eat a healthy diet, drink plenty of fluids, get enough sleep, and exercise regularly. Contact a health care provider if:  You develop new symptoms.  You have:  Chest pain.  Diarrhea.  A fever.  Your cough gets worse.  You produce more mucus.  You feel nauseous or you vomit. Get help right away if:  You develop shortness of breath or difficulty breathing.  Your skin or nails turn a bluish color.  You have severe pain or stiffness in your neck.  You develop a sudden headache or sudden pain in your face or ear.  You cannot stop vomiting. This information is not intended to replace advice given to you by your health care provider. Make sure you discuss any questions you have with your health care provider. Document Released: 06/19/2000 Document Revised: 11/28/2015 Document Reviewed: 04/16/2015 Elsevier Interactive Patient Education  2017 [employer].

## 2016-08-14 NOTE — Progress Notes (Signed)
Pre visit review using our clinic review tool, if applicable. No additional management support is needed unless otherwise documented below in the visit note. 

## 2016-08-14 NOTE — Assessment & Plan Note (Signed)
Clinical diagnosis.  Treat with tamiflu.  Further supportive care reviewed. Update if not improving with treatment.

## 2016-08-14 NOTE — Progress Notes (Signed)
BP 126/86   Pulse 82   Temp 100.1 F (37.8 C) (Oral)   Wt 160 lb 8 oz (72.8 kg)   SpO2 96%   BMI 25.91 kg/m    CC: URI Subjective:    Patient ID: Shannon Kelly, female    DOB: 27-Jan-1951, 66 y.o.   MRN: [id number]  HPI: Shannon Kelly is a 66 y.o. female presenting on 08/14/2016 for URI (cough,fever,chills,ST)   2d h/o low grade fever, headache, ST, chills, coughing with mild sputum. Mild PNDrainage.   No body aches, rhinorrhea, ear or tooth pain.   Treating with tylenol last took at 1pm 1000mg .  Did not get flu shot this year.  Husband sick recently.  No smokers at home. No h/o asthma.   Relevant past medical, surgical, family and social history reviewed and updated as indicated. Interim medical history since our last visit reviewed. Allergies and medications reviewed and updated. Current Outpatient Prescriptions on File Prior to Visit  Medication Sig  . cyanocobalamin 500 MCG tablet Take 1 tablet (500 mcg total) by mouth daily.  Marland Kitchen escitalopram (LEXAPRO) 10 MG tablet TAKE 1 TABLET BY MOUTH ONCE A DAY (Patient taking differently: TAKE 0.5 TABLET BY MOUTH ONCE EVERY THREE DAYS)  . LORazepam (ATIVAN) 1 MG tablet TAKE 1 TABLET BY MOUTH TWICE A DAY A NEEDED  . zolpidem (AMBIEN) 10 MG tablet Take 1 tablet (10 mg total) by mouth at bedtime.  Marland Kitchen EPINEPHrine (EPIPEN 2-PAK) 0.3 mg/0.3 mL IJ SOAJ injection Inject 0.3 mLs (0.3 mg total) into the muscle once. (Patient not taking: Reported on 10/04/2015)   No current facility-administered medications on file prior to visit.     Review of Systems Per HPI unless specifically indicated in ROS section     Objective:    BP 126/86   Pulse 82   Temp 100.1 F (37.8 C) (Oral)   Wt 160 lb 8 oz (72.8 kg)   SpO2 96%   BMI 25.91 kg/m   Wt Readings from Last 3 Encounters:  08/14/16 160 lb 8 oz (72.8 kg)  11/04/15 152 lb 4 oz (69.1 kg)  10/04/15 155 lb 8 oz (70.5 kg)    Physical Exam  Constitutional: She appears well-developed and  well-nourished. No distress.  HENT:  Head: Normocephalic and atraumatic.  Right Ear: Hearing, tympanic membrane, external ear and ear canal normal.  Left Ear: Hearing, tympanic membrane, external ear and ear canal normal.  Nose: Mucosal edema present. No rhinorrhea. Right sinus exhibits no maxillary sinus tenderness and no frontal sinus tenderness. Left sinus exhibits no maxillary sinus tenderness and no frontal sinus tenderness.  Mouth/Throat: Uvula is midline, oropharynx is clear and moist and mucous membranes are normal. No oropharyngeal exudate, posterior oropharyngeal edema, posterior oropharyngeal erythema or tonsillar abscesses.  Eyes: Conjunctivae and EOM are normal. Pupils are equal, round, and reactive to light. No scleral icterus.  Neck: Normal range of motion. Neck supple.  Cardiovascular: Normal rate, regular rhythm, normal heart sounds and intact distal pulses.   No murmur heard. Pulmonary/Chest: Effort normal and breath sounds normal. No respiratory distress. She has no wheezes. She has no rales.  Lymphadenopathy:    She has no cervical adenopathy.  Skin: Skin is warm and dry. No rash noted.  Nursing note and vitals reviewed.  Results for orders placed or performed in visit on 08/11/16  HM MAMMOGRAPHY  Result Value Ref Range   HM Mammogram 0-4 Bi-Rad 0-4 Bi-Rad, Self Reported Normal      Assessment &  Plan:   Problem List Items Addressed This Visit    Influenza - Primary    Clinical diagnosis.  Treat with tamiflu.  Further supportive care reviewed. Update if not improving with treatment.       Relevant Medications   oseltamivir (TAMIFLU) 75 MG capsule       Follow up plan: Return if symptoms worsen or fail to improve.  Ria Bush, MD

## 2016-08-21 ENCOUNTER — Encounter: Payer: Self-pay | Admitting: Family Medicine

## 2016-08-23 MED ORDER — GUAIFENESIN-CODEINE 100-10 MG/5ML PO SYRP: 5.0000 mL | ORAL_SOLUTION | Freq: Two times a day (BID) | ORAL | 0 refills | Status: DC | PRN

## 2016-09-01 ENCOUNTER — Encounter: Payer: Self-pay | Admitting: Family Medicine

## 2016-09-03 NOTE — Telephone Encounter (Signed)
See email plz call pt to schedule appt with me for welcome to medicare visit and acute visit with me or other provider if she'd like sooner for symptoms she is having.

## 2016-09-04 ENCOUNTER — Encounter: Payer: Self-pay | Admitting: Primary Care

## 2016-09-04 ENCOUNTER — Ambulatory Visit (INDEPENDENT_AMBULATORY_CARE_PROVIDER_SITE_OTHER): Payer: PPO | Admitting: Primary Care

## 2016-09-04 VITALS — BP 120/78 | HR 69 | Temp 97.8°F | Ht 66.0 in | Wt 161.8 lb

## 2016-09-04 DIAGNOSIS — R55 Syncope and collapse: Secondary | ICD-10-CM | POA: Diagnosis not present

## 2016-09-04 DIAGNOSIS — R42 Dizziness and giddiness: Secondary | ICD-10-CM

## 2016-09-04 LAB — TSH: TSH: 1.71 u[IU]/mL (ref 0.35–4.50)

## 2016-09-04 LAB — BASIC METABOLIC PANEL
BUN: 18 mg/dL (ref 6–23)
CHLORIDE: 103 meq/L (ref 96–112)
CO2: 29 mEq/L (ref 19–32)
Calcium: 9.6 mg/dL (ref 8.4–10.5)
Creatinine, Ser: 0.92 mg/dL (ref 0.40–1.20)
GFR: 65.12 mL/min (ref >60.00)
Glucose, Bld: 86 mg/dL (ref 70–99)
POTASSIUM: 4.4 meq/L (ref 3.5–5.1)
SODIUM: 139 meq/L (ref 135–145)

## 2016-09-04 LAB — CBC
HCT: 36.5 % (ref 36.0–46.0)
HEMOGLOBIN: 12.3 g/dL (ref 12.0–15.0)
MCHC: 33.8 g/dL (ref 30.0–36.0)
MCV: 91 fl (ref 78.0–100.0)
PLATELETS: 299 10*3/uL (ref 150.0–400.0)
RBC: 4.02 Mil/uL (ref 3.87–5.11)
RDW: 13.5 % (ref 11.5–15.5)
WBC: 6.4 10*3/uL (ref 4.0–10.5)

## 2016-09-04 LAB — VITAMIN B12: VITAMIN B 12: 714 pg/mL (ref 211–911)

## 2016-09-04 NOTE — Assessment & Plan Note (Signed)
Exertional with exercise x 3 incidences. No prior history of this. Doesn't seem to be vertigo, unlikely hypoglycemia. ECG today with sinus bradycardia, no ST abnormality. Orthostatic vitals normal. Check TSH, CBC, BMP, B 12 today. Consider referral to cardiology given exertional symptoms, likely needs stress test.

## 2016-09-04 NOTE — Progress Notes (Signed)
Pre visit review using our clinic review tool, if applicable. No additional management support is needed unless otherwise documented below in the visit note. 

## 2016-09-04 NOTE — Telephone Encounter (Signed)
Pt seening kate 09/04/16

## 2016-09-04 NOTE — Progress Notes (Signed)
Subjective:    Patient ID: Shannon Kelly, female    DOB: 09-11-1950, 66 y.o.   MRN: [id number]  HPI  Shannon Kelly is a 66 year old female with a history of anxiety and hyperlipidemia who presents today with a chief complaint of dizziness.   This has been intermittent for the past 6 weeks and occurs only during exercise class with [employer]. This has occurred a total of three times with last episode being Tuesday this week. She will feel weak, dizzy, shaky, and like she will pass out. These episodes last 30 minutes to 1 hour in duration. Her symptoms began within 30 minutes of exercise. She did feel as though the room was spinning during the initial episode, not on subsequent episodes.  She will eat cheese toast or oatmeal, plus water (8-10 ounces) and coffee before her exercise classes. She doesn't feel overworked during these exercise classes. She's been participating in these classes for 2 years without these symptoms.   She has had three deaths in her family, but doesn't feel overly anxious. She denies changes in food or OTC products since these episodes began. She was on Tamiflu three weeks ago for influenza. She denies prior cardiac condition, history of vertigo, history of diabetes.  Review of Systems  Constitutional: Positive for fatigue.  Eyes: Negative for visual disturbance.  Respiratory: Negative for cough and shortness of breath.   Cardiovascular: Negative for chest pain.  Gastrointestinal: Negative for nausea.  Neurological: Positive for dizziness, weakness and light-headedness.       Past Medical History:  Diagnosis Date  . Arthritis   . Depression   . Diverticula of colon    sigmoid  . HLD (hyperlipidemia)   . Insomnia   . 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 Microhematuria 06/2013   stable CT and [place], seemed to resolve, released from uro care 03/2014 [facility])  . Polyp, sigmoid colon    hyperplastic  . Postmenopausal atrophic vaginitis    responded to vagifem    . Urine incontinence      Social History   Social History  . Marital status: Married    Spouse name: N/A  . Number of children: 1  . Years of education: N/A   Occupational History  . Retired    Social History Main Topics  . Smoking status: Never Smoker  . Smokeless tobacco: Never Used  . Alcohol use No     Comment: occasional  . Drug use: No  . Sexual activity: Not on file   Other Topics Concern  . Not on file   Social History Narrative   Married, 1 son   Had been living in [place], return to [facility] fall 2014   Occupation: Retired, prior worked at [employer]   Edu: [employer]   Activity: plays with dog, some walking    Diet: good water, fruits/vegetables daily    Past Surgical History:  Procedure Laterality Date  . 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 CATARACT EXTRACTION W/ INTRAOCULAR LENS IMPLANT Right 07/12/2013   Dr. Wallace Going  . COLONOSCOPY  06/2013   microscopic colitis, rpt 5 yrs [facility])  . DEXA  05/2012   normal  . SHOULDER SURGERY Right 2005   Calcium deposit in tendon  . TUBAL LIGATION  1980  . URETHRAL SLING  08/27/2009   [facility]  . VAGINAL HYSTERECTOMY  1993   fibroids, ovaries remained ([facility])    Family History  Problem Relation Age of Onset  . Cancer Mother 66    colon, lymphoma  . Cancer Brother 40    RCC stage 4  . Cancer Brother 66    pancreatic  . Cancer Cousin 48    lung  . Cancer Cousin     breast  . Cancer Maternal Uncle     stomach  . Cancer Maternal Uncle     prostate  . Cancer Cousin     NHL  . Diabetes Brother   . Diabetes Father   . CAD Father 5    stents x2  . Cancer Father     pancreatic  . CAD Maternal Uncle   . Breast cancer Maternal Aunt     Allergies  Allergen Reactions  . Bee Venom   . Lunesta [Eszopiclone] Other (See Comments)    "Bad taste in mouth"    Current Outpatient Prescriptions on File Prior to Visit  Medication Sig Dispense  Refill  . celecoxib (CELEBREX) 200 MG capsule Take 1 capsule (200 mg total) by mouth daily. 90 capsule 1  . cyanocobalamin 500 MCG tablet Take 1 tablet (500 mcg total) by mouth daily.    Marland Kitchen LORazepam (ATIVAN) 1 MG tablet TAKE 1 TABLET BY MOUTH TWICE A DAY A NEEDED 60 tablet 2  . EPINEPHrine (EPIPEN 2-PAK) 0.3 mg/0.3 mL IJ SOAJ injection Inject 0.3 mLs (0.3 mg total) into the muscle once. (Patient not taking: Reported on 10/04/2015) 1 Device 0  . escitalopram (LEXAPRO) 10 MG tablet TAKE 1 TABLET BY MOUTH ONCE A DAY (Patient not taking: Reported on 09/04/2016) 30 tablet 3  . zolpidem (AMBIEN) 10 MG tablet Take 1 tablet (10 mg total) by mouth at bedtime. (Patient not taking: Reported on 09/04/2016) 30 tablet 3   No current facility-administered medications on file prior to visit.     BP 120/78   Pulse 69   Temp 97.8 F (36.6 C) (Oral)   Ht 5\' 6"  (1.676 m)   Wt 161 lb 12.8 oz (73.4 kg)   SpO2 97%   BMI 26.12 kg/m    Objective:   Physical Exam  Constitutional: She appears well-nourished.  Eyes: Pupils are equal, round, and reactive to light.  Cardiovascular: Normal rate, regular rhythm and normal heart sounds.   No murmur heard. Pulmonary/Chest: Effort normal and breath sounds normal.  Neurological: No cranial nerve deficit.  Skin: Skin is warm and dry.  Psychiatric: She has a normal mood and affect.          Assessment & Plan:

## 2016-09-04 NOTE — Patient Instructions (Signed)
The ECG of your heart looks stable.   Complete lab work prior to leaving today. I will notify you of your results once received.   Ensure you are consuming 64 ounces of water daily.  It was a pleasure meeting you!

## 2016-09-09 DIAGNOSIS — H2512 Age-related nuclear cataract, left eye: Secondary | ICD-10-CM | POA: Diagnosis not present

## 2016-09-14 ENCOUNTER — Other Ambulatory Visit: Payer: Self-pay | Admitting: Primary Care

## 2016-09-14 ENCOUNTER — Encounter: Payer: Self-pay | Admitting: Primary Care

## 2016-09-14 DIAGNOSIS — R42 Dizziness and giddiness: Secondary | ICD-10-CM

## 2016-09-15 ENCOUNTER — Encounter: Payer: Self-pay | Admitting: Cardiovascular Disease

## 2016-09-15 ENCOUNTER — Other Ambulatory Visit: Payer: Self-pay

## 2016-09-15 ENCOUNTER — Ambulatory Visit (INDEPENDENT_AMBULATORY_CARE_PROVIDER_SITE_OTHER): Payer: PPO | Admitting: Cardiovascular Disease

## 2016-09-15 ENCOUNTER — Other Ambulatory Visit: Payer: Self-pay | Admitting: Cardiovascular Disease

## 2016-09-15 ENCOUNTER — Other Ambulatory Visit: Payer: PPO

## 2016-09-15 VITALS — BP 142/76 | HR 68 | Ht 66.0 in | Wt 162.5 lb

## 2016-09-15 DIAGNOSIS — R55 Syncope and collapse: Secondary | ICD-10-CM | POA: Diagnosis not present

## 2016-09-15 DIAGNOSIS — Z01810 Encounter for preprocedural cardiovascular examination: Secondary | ICD-10-CM

## 2016-09-15 DIAGNOSIS — R42 Dizziness and giddiness: Secondary | ICD-10-CM

## 2016-09-15 LAB — ECHOCARDIOGRAM STRESS TEST
CHL CUP MPHR: 156 {beats}/min
CSEPEW: 9.4 METS
CSEPPHR: 171 {beats}/min
Exercise duration (min): 7 min
Exercise duration (sec): 35 s
Percent HR: 109 %
Rest HR: 77 {beats}/min

## 2016-09-15 NOTE — Progress Notes (Signed)
Cardiology Office Note   Date:  09/15/2016   ID:  Shannon Kelly, DOB [birth year], MRN [id number]  PCP:  Ria Bush, MD  Cardiologist:   Kathlyn Sacramento, MD   Chief Complaint  Patient presents with  . other    New patient. Referred by Pleas Koch. Patient c/o weak and shakey with exertion. Meds reviewed verbally with patient.       History of Present Illness: Shannon Kelly is a 66 y.o. female who was referred by Alma Friendly, NP for evaluation of dizziness. She has known history of anxiety and hyperlipidemia. No prior cardiac history. She has been exercising regularly at a Silver sne[employer] class. She had 3 episodes of dizziness, shaking and presyncope since January which all happened during exercise. These symptoms lasted for about 15-20 minutes and improved with rest. She did not have chest pain during these episodes and there was no loss of consciousness. She has been under increased stress over the last year as she lost 2 brothers and her father to cancer. She is a lifelong nonsmoker and there is no family history of premature coronary artery disease. There is no history of diabetes, hypertension or hyperlipidemia.     Past Medical History:  Diagnosis Date  . Arthritis   . Depression   . Diverticula of colon    sigmoid  . HLD (hyperlipidemia)   . Insomnia   . 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 Microhematuria 06/2013   stable CT and [place], seemed to resolve, released from uro care 03/2014 [facility])  . Polyp, sigmoid colon    hyperplastic  . Postmenopausal atrophic vaginitis    responded to vagifem  . Urine incontinence     Past Surgical History:  Procedure Laterality Date  . 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 CATARACT EXTRACTION W/ INTRAOCULAR LENS IMPLANT Right 07/12/2013   Dr. Wallace Going  . COLONOSCOPY  06/2013   microscopic colitis, rpt 5 yrs [facility])  . DEXA  05/2012   normal  . SHOULDER SURGERY Right 2005    Calcium deposit in tendon  . TUBAL LIGATION  1980  . URETHRAL SLING  08/27/2009   [facility]  . VAGINAL HYSTERECTOMY  1993   fibroids, ovaries remained ([facility])     Current Outpatient Prescriptions  Medication Sig Dispense Refill  . celecoxib (CELEBREX) 200 MG capsule Take 1 capsule (200 mg total) by mouth daily. 90 capsule 1  . cyanocobalamin 500 MCG tablet Take 1 tablet (500 mcg total) by mouth daily.    Marland Kitchen LORazepam (ATIVAN) 1 MG tablet TAKE 1 TABLET BY MOUTH TWICE A DAY A NEEDED 60 tablet 2  . EPINEPHrine (EPIPEN 2-PAK) 0.3 mg/0.3 mL IJ SOAJ injection Inject 0.3 mLs (0.3 mg total) into the muscle once. (Patient not taking: Reported on 10/04/2015) 1 Device 0  . escitalopram (LEXAPRO) 10 MG tablet TAKE 1 TABLET BY MOUTH ONCE A DAY (Patient not taking: Reported on 09/04/2016) 30 tablet 3  . zolpidem (AMBIEN) 10 MG tablet Take 1 tablet (10 mg total) by mouth at bedtime. (Patient not taking: Reported on 09/04/2016) 30 tablet 3   No current facility-administered medications for this visit.     Allergies:   Bee venom and Lunesta [eszopiclone]    Social History:  The patient  reports that she has never smoked. She has never used smokeless tobacco. She reports that she does not drink alcohol or use drugs.   Family History:  The patient's  family history includes Breast cancer in her maternal aunt; CAD in her maternal uncle; CAD (age of onset: 53) in her father; Cancer in her cousin, cousin, father, maternal uncle, and maternal uncle; Cancer (age of onset: 52) in her brother; Cancer (age of onset: 52) in her cousin; Cancer (age of onset: 68) in her brother; Cancer (age of onset: 47) in her mother; Diabetes in her brother and father.    ROS:  Please see the history of present illness.   Otherwise, review of systems are positive for none.   All other systems are reviewed and negative.    PHYSICAL EXAM: VS:  BP (!) 142/76 (BP Location: Left Arm, Patient Position: Sitting, Cuff Size: Normal)    Pulse 68   Ht 5\' 6"  (1.676 m)   Wt 162 lb 8 oz (73.7 kg)   BMI 26.23 kg/m  , BMI Body mass index is 26.23 kg/m. GEN: Well nourished, well developed, in no acute distress  HEENT: normal  Neck: no JVD, carotid bruits, or masses Cardiac: RRR; no murmurs, rubs, or gallops,no edema  Respiratory:  clear to auscultation bilaterally, normal work of breathing GI: soft, nontender, nondistended, + BS MS: no deformity or atrophy  Skin: warm and dry, no rash Neuro:  Strength and sensation are intact Psych: euthymic mood, full affect   EKG:  EKG is not ordered today. Recent EKG was reviewed which showed sinus bradycardia with no significant ST or T wave changes.   Recent Labs: 09/27/2015: ALT 17 09/04/2016: BUN 18; Creatinine, Ser 0.92; Hemoglobin 12.3; Platelets 299.0; Potassium 4.4; Sodium 139; TSH 1.71    Lipid Panel    Component Value Date/Time   CHOL 200 09/27/2015 0824   CHOL 224 04/27/2012   TRIG 85.0 09/27/2015 0824   TRIG 114 04/27/2012   HDL 56.60 09/27/2015 0824   CHOLHDL 4 09/27/2015 0824   VLDL 17.0 09/27/2015 0824   LDLCALC 127 (H) 09/27/2015 0824   LDLCALC 143 04/27/2012   LDLDIRECT 162.1 06/07/2013 1051      Wt Readings from Last 3 Encounters:  09/15/16 162 lb 8 oz (73.7 kg)  09/04/16 161 lb 12.8 oz (73.4 kg)  08/14/16 160 lb 8 oz (72.8 kg)      PAD Screen 09/15/2016  Previous PAD dx? No  Previous surgical procedure? No  Pain with walking? No  Feet/toe relief with dangling? Yes  Painful, non-healing ulcers? No  Extremities discolored? No      ASSESSMENT AND PLAN:  1.  Exertional dizziness and presyncope: The exact etiology is not entirely clear. She is not orthostatic today. I do think we have to evaluate for atypical angina or exercise induced arrhythmia and we have to evaluate heart pressure response to exercise. Thus, I requested a stress echocardiogram.  2. The patient is going to undergo cataract surgery next week. This should be no  contraindication from a cardiac standpoint given that there is a low risk surgery and she has good functional capacity.    Disposition:   FU with me as needed .  Signed,  Kathlyn Sacramento, MD  09/15/2016 3:16 PM    [place]

## 2016-09-15 NOTE — Patient Instructions (Addendum)
Medication Instructions:  No change.    Labwork: none  Testing/Procedures: Your physician has requested that you have a stress echocardiogram today. For further information please visit [email or web address]. Please follow instruction sheet as given.    Follow-Up: Your physician recommends that you schedule a follow-up appointment as needed.    Any Other Special Instructions Will Be Listed Below (If Applicable).     If you need a refill on your cardiac medications before your next appointment, please call your pharmacy.

## 2016-09-17 ENCOUNTER — Telehealth: Payer: Self-pay

## 2016-09-17 ENCOUNTER — Telehealth: Payer: Self-pay | Admitting: Cardiovascular Disease

## 2016-09-17 ENCOUNTER — Ambulatory Visit: Payer: Self-pay | Admitting: Cardiology

## 2016-09-17 DIAGNOSIS — R0602 Shortness of breath: Secondary | ICD-10-CM

## 2016-09-17 DIAGNOSIS — R42 Dizziness and giddiness: Secondary | ICD-10-CM

## 2016-09-17 NOTE — Telephone Encounter (Addendum)
Noted. Spoke with Anda Kraft. Would also see what exactly exercise class entails.

## 2016-09-17 NOTE — Telephone Encounter (Signed)
Pt calling stating she went to her Gym this morning and she had another episode  Sliver sneakers class She got weak, shaky, dots in her eyes. Along with a bit of dizzy  They checked her BP and it was around 112/65, and this was about 10 after working out   30 min later she went to her PCP and they took her BP and it was about 120/70   Please advise

## 2016-09-17 NOTE — Telephone Encounter (Signed)
Her stress test was normal except for elevated blood pressure. It does not seem that her blood pressure is running high now. She should stop weightlifting for now and just do aerobic exercises. She should be fine to proceed with cataract surgery. Let's schedule her for an echocardiogram and carotid Doppler ( Dx: dizziness and dyspnea).  scheduled follow-up after testing.

## 2016-09-17 NOTE — Telephone Encounter (Signed)
Noted, will evaluate. 

## 2016-09-17 NOTE — Telephone Encounter (Signed)
Pt walked in this morning; pts exercise class started at 9:30 AM today and within 15 mins pt had episode of generalized weakness,dots before her eyes,shaky and clammy. Pt stopped exercising BP taken  Was 112/65. Pt has had 4 episodes since Jan after or during exercising. Pt came to [facility] No CP,SOB or H/A. Head felt slightly dizzy that seem to come and go. Pt said she felt OK now but concerned about having another episode. BP at office 118/70 P 83 and pulse ox 96%. No available appts at [facility] or [place]. Dr Darnell Level said for pt to rest today and schedule appt at [facility] on 09/18/16. Pt to see Allie Bossier NP on 09/18/16 at 8:45 AM. Dr Leigh Aurora for pt to contact Dr Tyrell Antonio office and let them know about episode since seen 09/15/16. Pt wants to know if OK to have cataract surgery on 09/23/16; Dr Darnell Level thought probably OK but to mention to Dr Tyrell Antonio office also.  Pt will not exercise until seen and further advised.FYI to Allie Bossier NP and Dr Darnell Level.

## 2016-09-17 NOTE — Telephone Encounter (Signed)
Returned call to patient. Patient was exercising at the [facility] today using weights and bending up and down. This is the usual type of exercises they do and thinks this is when the other episodes occurred as well. She got dizzy and very weak and shaky. Patient's BP at [facility] was 112/65 and then she went to her PCP and BP was 118/70, HR 83 and 96% RA. (see telephone note from PCP from today).  She is scheduled to see Alma Friendly NP tomorrow for evaluation. Currently, patient says she feels fine. Patient asked if we had results of stress echo. Advised Dr Fletcher Anon is in the process of reviewing. Reviewed plan from OV on 09/15/16 with Dr Fletcher Anon: "1.  Exertional dizziness and presyncope: The exact etiology is not entirely clear. She is not orthostatic today. I do think we have to evaluate for atypical angina or exercise induced arrhythmia and we have to evaluate heart pressure response to exercise. Thus, I requested a stress echocardiogram. She asked is there any additional testing she may need to evaluate her symptoms."  Will route to Dr Fletcher Anon for review.

## 2016-09-18 ENCOUNTER — Ambulatory Visit (INDEPENDENT_AMBULATORY_CARE_PROVIDER_SITE_OTHER): Payer: PPO | Admitting: Primary Care

## 2016-09-18 ENCOUNTER — Encounter: Payer: Self-pay | Admitting: *Deleted

## 2016-09-18 ENCOUNTER — Encounter: Payer: Self-pay | Admitting: Primary Care

## 2016-09-18 DIAGNOSIS — F418 Other specified anxiety disorders: Secondary | ICD-10-CM

## 2016-09-18 DIAGNOSIS — R42 Dizziness and giddiness: Secondary | ICD-10-CM

## 2016-09-18 DIAGNOSIS — F5104 Psychophysiologic insomnia: Secondary | ICD-10-CM | POA: Diagnosis not present

## 2016-09-18 NOTE — Telephone Encounter (Signed)
Reviewed Dr. Tyrell Antonio results and recommendations. She verbalized understanding, agreement with plan, and had no further questions at this time.

## 2016-09-18 NOTE — Progress Notes (Signed)
Subjective:    Patient ID: Shannon Kelly, female    DOB: 19-Apr-1951, 66 y.o.   MRN: [id number]  HPI  Shannon Kelly is a 66 year old female who presents today with a chief complaint of dizziness and shakiness. Shannon Kelly presented on 09/04/16 with a 6 week history of intermittent dizziness that occurred only during exercise. Symptoms included weakness, dizziness, feeling shaky, feeling as though Shannon Kelly will pass out. Shannon Kelly endorsed proper hydration and food intake prior to her exercises. Since that visit Shannon Kelly was evaluated by cardiology and underwent stress echo which was without stress arrhythmias or conduction abnormalities, ECG was negative for ischemia.   Since her cardiology visit Shannon Kelly resumed exercise yesterday. Shannon Kelly was participating in an exercise class and about 15 minutes into the class Shannon Kelly stared feeling weak, seeing spots in her eyes, felt shaky, and felt like her legs were going to give out. Shannon Kelly went to the front desk of the exercise center who checked her BP which was 112/65. Her symptoms improved within 45 minutes but Shannon Kelly continued to feel fatigued throughout the day. Shannon Kelly doesn't ever feel as though the room is spinning around her. Her exercise classes entail balance, weights, cardio, stretching with moderate strenuous activity.   Shannon Kelly is not taking Ambien, still taking 0.5 mg of Lorazepam every night at bedtime which doesn't help much. Shannon Kelly's taking celebrex once every morning, Vitmain B 12 tablet every morning. Shannon Kelly has felt fatigued for the past 1 year. Shannon Kelly does endorse feeling anxious and paranoid over certain things such as riding in a car with other people. Her son has voiced that Shannon Kelly experiences daily worry and stress.     Review of Systems  Constitutional: Positive for fatigue.  Eyes: Positive for visual disturbance.  Respiratory: Negative for shortness of breath.   Cardiovascular: Negative for chest pain and palpitations.  Gastrointestinal: Negative for nausea.  Neurological: Positive for  dizziness and weakness.  Psychiatric/Behavioral: Positive for sleep disturbance. The patient is nervous/anxious.        Past Medical History:  Diagnosis Date  . Arthritis   . Depression   . Diverticula of colon    sigmoid  . HLD (hyperlipidemia)   . Insomnia   . 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 Microhematuria 06/2013   stable CT and [place], seemed to resolve, released from uro care 03/2014 [facility])  . Polyp, sigmoid colon    hyperplastic  . PONV (postoperative nausea and vomiting)    OK after last cataract surgery  . Postmenopausal atrophic vaginitis    responded to vagifem  . Urine incontinence      Social History   Social History  . Marital status: Married    Spouse name: N/A  . Number of children: 1  . Years of education: N/A   Occupational History  . Retired    Social History Main Topics  . Smoking status: Never Smoker  . Smokeless tobacco: Never Used  . Alcohol use No     Comment: occasional  . Drug use: No  . Sexual activity: Not on file   Other Topics Concern  . Not on file   Social History Narrative   Married, 1 son   Had been living in [place], return to [facility] fall 2014   Occupation: Retired, prior worked at [employer]   Edu: [employer]   Activity: plays with dog, some walking    Diet: good water, fruits/vegetables daily    Past Surgical History:  Procedure Laterality Date  .  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 CATARACT EXTRACTION W/ INTRAOCULAR LENS IMPLANT Right 07/12/2013   Dr. Wallace Going  . COLONOSCOPY  06/2013   microscopic colitis, rpt 5 yrs [facility])  . DEXA  05/2012   normal  . SHOULDER SURGERY Right 2005   Calcium deposit in tendon  . TUBAL LIGATION  1980  . URETHRAL SLING  08/27/2009   [facility]  . VAGINAL HYSTERECTOMY  1993   fibroids, ovaries remained ([facility])    Family History  Problem Relation Age of Onset  . Cancer Mother 20    colon, lymphoma  . Cancer Brother 42     RCC stage 4  . Cancer Brother 15    pancreatic  . Cancer Cousin 48    lung  . Cancer Cousin     breast  . Cancer Maternal Uncle     stomach  . Cancer Maternal Uncle     prostate  . Cancer Cousin     NHL  . Diabetes Brother   . Diabetes Father   . CAD Father 54    stents x2  . Cancer Father     pancreatic  . CAD Maternal Uncle   . Breast cancer Maternal Aunt     Allergies  Allergen Reactions  . Bee Venom   . Lunesta [Eszopiclone] Other (See Comments)    "Bad taste in mouth"    Current Outpatient Prescriptions on File Prior to Visit  Medication Sig Dispense Refill  . celecoxib (CELEBREX) 200 MG capsule Take 1 capsule (200 mg total) by mouth daily. 90 capsule 1  . cyanocobalamin 500 MCG tablet Take 1 tablet (500 mcg total) by mouth daily.    Marland Kitchen EPINEPHrine (EPIPEN 2-PAK) 0.3 mg/0.3 mL IJ SOAJ injection Inject 0.3 mLs (0.3 mg total) into the muscle once. 1 Device 0  . LORazepam (ATIVAN) 1 MG tablet TAKE 1 TABLET BY MOUTH TWICE A DAY A NEEDED 60 tablet 2   No current facility-administered medications on file prior to visit.     BP 124/84   Pulse 65   Temp 98 F (36.7 C) (Oral)   Ht 5\' 6"  (1.676 m)   Wt 161 lb (73 kg)   SpO2 98%   BMI 25.99 kg/m    Objective:   Physical Exam  Constitutional: Shannon Kelly is oriented to person, place, and time. Shannon Kelly appears well-nourished.  Eyes: EOM are normal. Pupils are equal, round, and reactive to light.  Neck: Neck supple.  Cardiovascular: Normal rate and regular rhythm.   Pulmonary/Chest: Effort normal and breath sounds normal.  Neurological: Shannon Kelly is alert and oriented to person, place, and time. Shannon Kelly has normal reflexes. No cranial nerve deficit. Coordination normal.  Skin: Skin is warm and dry.  Psychiatric: Shannon Kelly has a normal mood and affect.          Assessment & Plan:

## 2016-09-18 NOTE — Progress Notes (Signed)
Pre visit review using our clinic review tool, if applicable. No additional management support is needed unless otherwise documented below in the visit note. 

## 2016-09-18 NOTE — Assessment & Plan Note (Signed)
Experienced symptoms yesterday during exercise. Stress echocardiogram unremarkable per cards. Due to have carotid dopplers and echocardiogram. Orthostatic vitals today unremarkable. Exam today unremarkable.  Still believe this could be cardiac related, if work up negative consider neuro evaluation.

## 2016-09-18 NOTE — Assessment & Plan Note (Signed)
Doesn't seem well controlled, consider therapy. Patient will discuss with PCP at upcoming visit in April.

## 2016-09-18 NOTE — Assessment & Plan Note (Signed)
Lorazepam ineffective, she will discuss with PCP at upcoming visit.

## 2016-09-18 NOTE — Patient Instructions (Signed)
No exercise until Dr. Fletcher Anon has cleared you.  It is planned for your to undergo an echocardiogram and carotid artery evaluation in the near future. Please call Dr. Tyrell Antonio office for clarification.  Follow up with Dr. Danise Mina as scheduled.  It was a pleasure to see you today!

## 2016-09-21 ENCOUNTER — Other Ambulatory Visit: Payer: Self-pay | Admitting: Cardiovascular Disease

## 2016-09-21 ENCOUNTER — Ambulatory Visit: Payer: PPO

## 2016-09-21 DIAGNOSIS — R0602 Shortness of breath: Secondary | ICD-10-CM

## 2016-09-21 DIAGNOSIS — R42 Dizziness and giddiness: Secondary | ICD-10-CM

## 2016-09-21 LAB — VAS US CAROTID
LCCAPSYS: 201 cm/s
LEFT ECA DIAS: -19 cm/s
LEFT VERTEBRAL DIAS: -16 cm/s
Left CCA dist dias: -47 cm/s
Left CCA dist sys: -162 cm/s
Left CCA prox dias: 48 cm/s
Left ICA dist dias: -48 cm/s
Left ICA dist sys: -117 cm/s
Left ICA prox dias: -18 cm/s
Left ICA prox sys: -97 cm/s
RCCADSYS: -92 cm/s
RCCAPDIAS: 31 cm/s
RIGHT ECA DIAS: -27 cm/s
RIGHT VERTEBRAL DIAS: -19 cm/s
Right CCA prox sys: 150 cm/s

## 2016-09-21 NOTE — Discharge Instructions (Signed)
Cataract Surgery, Care After °Refer to this sheet in the next few weeks. These instructions provide you with information about caring for yourself after your procedure. Your health care provider may also give you more specific instructions. Your treatment has been planned according to current medical practices, but problems sometimes occur. Call your health care provider if you have any problems or questions after your procedure. °What can I expect after the procedure? °After the procedure, it is common to have: °· Itching. °· Discomfort. °· Fluid discharge. °· Sensitivity to light and to touch. °· Bruising. °Follow these instructions at home: °Eye Care  °· Check your eye every day for signs of infection. Watch for: °¨ Redness, swelling, or pain. °¨ Fluid, blood, or pus. °¨ Warmth. °¨ Bad smell. °Activity  °· Avoid strenuous activities, such as playing contact sports, for as long as told by your health care provider. °· Do not drive or operate heavy machinery until your health care provider approves. °· Do not bend or lift heavy objects . Bending increases pressure in the eye. You can walk, climb stairs, and do light household chores. °· Ask your health care provider when you can return to work. If you work in a dusty environment, you may be advised to wear protective eyewear for a period of time. °General instructions  °· Take or apply over-the-counter and prescription medicines only as told by your health care provider. This includes eye drops. °· Do not touch or rub your eyes. °· If you were given a protective shield, wear it as told by your health care provider. If you were not given a protective shield, wear sunglasses as told by your health care provider to protect your eyes. °· Keep the area around your eye clean and dry. Avoid swimming or allowing water to hit you directly in the face while showering until told by your health care provider. Keep soap and shampoo out of your eyes. °· Do not put a contact lens  into the affected eye or eyes until your health care provider approves. °· Keep all follow-up visits as told by your health care provider. This is important. °Contact a health care provider if: ° °· You have increased bruising around your eye. °· You have pain that is not helped with medicine. °· You have a fever. °· You have redness, swelling, or pain in your eye. °· You have fluid, blood, or pus coming from your incision. °· Your vision gets worse. °Get help right away if: °· You have sudden vision loss. °This information is not intended to replace advice given to you by your health care provider. Make sure you discuss any questions you have with your health care provider. °Document Released: 01/09/2005 Document Revised: 10/31/2015 Document Reviewed: 05/02/2015 °Elsevier Interactive Patient Education © 2017 [employer] ° ° ° ° °General Anesthesia, Adult, Care After °These instructions provide you with information about caring for yourself after your procedure. Your health care provider may also give you more specific instructions. Your treatment has been planned according to current medical practices, but problems sometimes occur. Call your health care provider if you have any problems or questions after your procedure. °What can I expect after the procedure? °After the procedure, it is common to have: °· Vomiting. °· A sore throat. °· Mental slowness. °It is common to feel: °· Nauseous. °· Cold or shivery. °· Sleepy. °· Tired. °· Sore or achy, even in parts of your body where you did not have surgery. °Follow these instructions at   home: °For at least 24 hours after the procedure:  °· Do not: °¨ Participate in activities where you could fall or become injured. °¨ Drive. °¨ Use heavy machinery. °¨ Drink alcohol. °¨ Take sleeping pills or medicines that cause drowsiness. °¨ Make important decisions or sign legal documents. °¨ Take care of children on your own. °· Rest. °Eating and drinking  °· If you vomit, drink  water, juice, or soup when you can drink without vomiting. °· Drink enough fluid to keep your urine clear or pale yellow. °· Make sure you have little or no nausea before eating solid foods. °· Follow the diet recommended by your health care provider. °General instructions  °· Have a responsible adult stay with you until you are awake and alert. °· Return to your normal activities as told by your health care provider. Ask your health care provider what activities are safe for you. °· Take over-the-counter and prescription medicines only as told by your health care provider. °· If you smoke, do not smoke without supervision. °· Keep all follow-up visits as told by your health care provider. This is important. °Contact a health care provider if: °· You continue to have nausea or vomiting at home, and medicines are not helpful. °· You cannot drink fluids or start eating again. °· You cannot urinate after 8-12 hours. °· You develop a skin rash. °· You have fever. °· You have increasing redness at the site of your procedure. °Get help right away if: °· You have difficulty breathing. °· You have chest pain. °· You have unexpected bleeding. °· You feel that you are having a life-threatening or urgent problem. °This information is not intended to replace advice given to you by your health care provider. Make sure you discuss any questions you have with your health care provider. °Document Released: 09/28/2000 Document Revised: 11/25/2015 Document Reviewed: 06/06/2015 °Elsevier Interactive Patient Education © 2017 [employer] ° °

## 2016-09-23 ENCOUNTER — Encounter
Admission: RE | Disposition: A | Discharge status: Home / Self Care | Payer: Self-pay | Source: Ambulatory Visit | Attending: Ophthalmology

## 2016-09-23 ENCOUNTER — Ambulatory Visit
Admission: RE | Admit: 2016-09-23 | Discharge: 2016-09-23 | Disposition: A | Discharge status: Home / Self Care | Payer: PPO | Source: Ambulatory Visit | Attending: Ophthalmology | Admitting: Ophthalmology

## 2016-09-23 ENCOUNTER — Ambulatory Visit: Payer: PPO | Admitting: Anesthesiology

## 2016-09-23 DIAGNOSIS — F329 Major depressive disorder, single episode, unspecified: Secondary | ICD-10-CM | POA: Diagnosis not present

## 2016-09-23 DIAGNOSIS — M1990 Unspecified osteoarthritis, unspecified site: Secondary | ICD-10-CM | POA: Diagnosis not present

## 2016-09-23 DIAGNOSIS — H2512 Age-related nuclear cataract, left eye: Secondary | ICD-10-CM | POA: Diagnosis not present

## 2016-09-23 HISTORY — DX: Other specified postprocedural states: Z98.890

## 2016-09-23 HISTORY — DX: Nausea with vomiting, unspecified: R11.2

## 2016-09-23 HISTORY — PX: CATARACT EXTRACTION W/PHACO: SHX586

## 2016-09-23 SURGERY — PHACOEMULSIFICATION, CATARACT, WITH IOL INSERTION
Anesthesia: Monitor Anesthesia Care | Laterality: Left | Wound class: Clean

## 2016-09-23 MED ORDER — CEFUROXIME OPHTHALMIC INJECTION 1 MG/0.1 ML
INJECTION | OPHTHALMIC | Status: DC | PRN
  Administered 2016-09-23: 0.1 mL via OPHTHALMIC

## 2016-09-23 MED ORDER — BRIMONIDINE TARTRATE-TIMOLOL 0.2-0.5 % OP SOLN
OPHTHALMIC | Status: DC | PRN
  Administered 2016-09-23: 1 [drp] via OPHTHALMIC

## 2016-09-23 MED ORDER — LIDOCAINE HCL (PF) 2 % IJ SOLN
INTRAOCULAR | Status: DC | PRN
  Administered 2016-09-23: 1 mL via INTRAOCULAR

## 2016-09-23 MED ORDER — ARMC OPHTHALMIC DILATING DROPS
1.0000 "application " | OPHTHALMIC | Status: DC | PRN
  Administered 2016-09-23 (×3): 1 via OPHTHALMIC

## 2016-09-23 MED ORDER — FENTANYL CITRATE (PF) 100 MCG/2ML IJ SOLN
INTRAMUSCULAR | Status: DC | PRN
  Administered 2016-09-23: 50 ug via INTRAVENOUS

## 2016-09-23 MED ORDER — NA HYALUR & NA CHOND-NA HYALUR 0.4-0.35 ML IO KIT
PACK | INTRAOCULAR | Status: DC | PRN
  Administered 2016-09-23: 1 mL via INTRAOCULAR

## 2016-09-23 MED ORDER — MIDAZOLAM HCL 2 MG/2ML IJ SOLN
INTRAMUSCULAR | Status: DC | PRN
  Administered 2016-09-23: 2 mg via INTRAVENOUS

## 2016-09-23 MED ORDER — EPINEPHRINE PF 1 MG/ML IJ SOLN
INTRAOCULAR | Status: DC | PRN
  Administered 2016-09-23: 62 mL via OPHTHALMIC

## 2016-09-23 MED ORDER — MOXIFLOXACIN HCL 0.5 % OP SOLN
1.0000 [drp] | OPHTHALMIC | Status: DC | PRN
  Administered 2016-09-23 (×3): 1 [drp] via OPHTHALMIC

## 2016-09-23 SURGICAL SUPPLY — 25 items
CANNULA ANT/CHMB 27GA (MISCELLANEOUS) ×2 IMPLANT
CARTRIDGE ABBOTT (MISCELLANEOUS) IMPLANT
GLOVE SURG LX 7.5 STRW (GLOVE) ×1
GLOVE SURG LX STRL 7.5 STRW (GLOVE) ×1 IMPLANT
GLOVE SURG TRIUMPH 8.0 PF LTX (GLOVE) ×2 IMPLANT
GOWN STRL REUS W/ TWL LRG LVL3 (GOWN DISPOSABLE) ×2 IMPLANT
GOWN STRL REUS W/TWL LRG LVL3 (GOWN DISPOSABLE) ×2
LENS IOL TECNIS ITEC 18.5 (Intraocular Lens) ×2 IMPLANT
MARKER SKIN DUAL TIP RULER LAB (MISCELLANEOUS) ×2 IMPLANT
NDL RETROBULBAR .5 NSTRL (NEEDLE) IMPLANT
NEEDLE FILTER BLUNT 18X 1/2SAF (NEEDLE) ×1
NEEDLE FILTER BLUNT 18X1 1/2 (NEEDLE) ×1 IMPLANT
PACK CATARACT BRASINGTON (MISCELLANEOUS) ×2 IMPLANT
PACK EYE AFTER SURG (MISCELLANEOUS) ×2 IMPLANT
PACK OPTHALMIC (MISCELLANEOUS) ×2 IMPLANT
RING MALYGIN 7.0 (MISCELLANEOUS) IMPLANT
SUT ETHILON 10-0 CS-B-6CS-B-6 (SUTURE)
SUT VICRYL  9 0 (SUTURE)
SUT VICRYL 9 0 (SUTURE) IMPLANT
SUTURE EHLN 10-0 CS-B-6CS-B-6 (SUTURE) IMPLANT
SYR 3ML LL SCALE MARK (SYRINGE) ×2 IMPLANT
SYR 5ML LL (SYRINGE) ×2 IMPLANT
SYR TB 1ML LUER SLIP (SYRINGE) ×2 IMPLANT
WATER STERILE IRR 250ML POUR (IV SOLUTION) ×2 IMPLANT
WIPE NON LINTING 3.25X3.25 (MISCELLANEOUS) ×2 IMPLANT

## 2016-09-23 NOTE — Anesthesia Postprocedure Evaluation (Signed)
Anesthesia Post Note  Patient: Shannon Kelly  Procedure(s) Performed: Procedure(s) (LRB): CATARACT EXTRACTION PHACO AND INTRAOCULAR LENS PLACEMENT (IOC)  Left (Left)  Patient location during evaluation: PACU Anesthesia Type: MAC Level of consciousness: awake and alert and oriented Pain management: pain level controlled Vital Signs Assessment: post-procedure vital signs reviewed and stable Respiratory status: spontaneous breathing and nonlabored ventilation Cardiovascular status: stable Postop Assessment: no signs of nausea or vomiting and adequate PO intake Anesthetic complications: no    Estill Batten

## 2016-09-23 NOTE — Transfer of Care (Signed)
Immediate Anesthesia Transfer of Care Note  Patient: Shannon Kelly  Procedure(s) Performed: Procedure(s): CATARACT EXTRACTION PHACO AND INTRAOCULAR LENS PLACEMENT (IOC)  Left (Left)  Patient Location: PACU  Anesthesia Type: MAC  Level of Consciousness: awake, alert  and patient cooperative  Airway and Oxygen Therapy: Patient Spontanous Breathing and Patient connected to supplemental oxygen  Post-op Assessment: Post-op Vital signs reviewed, Patient's Cardiovascular Status Stable, Respiratory Function Stable, Patent Airway and No signs of Nausea or vomiting  Post-op Vital Signs: Reviewed and stable  Complications: No apparent anesthesia complications

## 2016-09-23 NOTE — H&P (Signed)
The History and Physical notes are on paper, have been signed, and are to be scanned. The patient remains stable and unchanged from the H&P.   Previous H&P reviewed, patient examined, and there are no changes.  Shannon Kelly 09/23/2016 9:19 AM

## 2016-09-23 NOTE — Op Note (Signed)
OPERATIVE NOTE  Shannon Kelly [id number] 09/23/2016   PREOPERATIVE DIAGNOSIS:  Nuclear sclerotic cataract left eye. H25.12   POSTOPERATIVE DIAGNOSIS:    Nuclear sclerotic cataract left eye.     PROCEDURE:  Phacoemusification with posterior chamber intraocular lens placement of the left eye   LENS:   Implant Name Type Inv. Item Serial No. Manufacturer Lot No. LRB No. Used  LENS IOL DIOP 18.5 - [id number] Intraocular Lens LENS IOL DIOP 18.5 [id number]   Left 1        ULTRASOUND TIME: 18  % of 1 minutes 7 seconds, CDE 12.8  SURGEON:  Wyonia Hough, MD   ANESTHESIA:  Topical with tetracaine drops and 2% Xylocaine jelly, augmented with 1% preservative-free intracameral lidocaine.    COMPLICATIONS:  None.   DESCRIPTION OF PROCEDURE:  The patient was identified in the holding room and transported to the operating room and placed in the supine position under the operating microscope.  The left eye was identified as the operative eye and it was prepped and draped in the usual sterile ophthalmic fashion.   A 1 millimeter clear-corneal paracentesis was made at the 1:30 position.  0.5 ml of preservative-free 1% lidocaine was injected into the anterior chamber.  The anterior chamber was filled with Viscoat viscoelastic.  A 2.4 millimeter keratome was used to make a near-clear corneal incision at the 10:30 position.  .  A curvilinear capsulorrhexis was made with a cystotome and capsulorrhexis forceps.  Balanced salt solution was used to hydrodissect and hydrodelineate the nucleus.   Phacoemulsification was then used in stop and chop fashion to remove the lens nucleus and epinucleus.  The remaining cortex was then removed using the irrigation and aspiration handpiece. Provisc was then placed into the capsular bag to distend it for lens placement.  A lens was then injected into the capsular bag.  The remaining viscoelastic was aspirated.   Wounds were hydrated with balanced salt  solution.  The anterior chamber was inflated to a physiologic pressure with balanced salt solution.  No wound leaks were noted. Cefuroxime 0.1 ml of a 10mg /ml solution was injected into the anterior chamber for a dose of 1 mg of intracameral antibiotic at the completion of the case.   Timolol and Brimonidine drops were applied to the eye.  The patient was taken to the recovery room in stable condition without complications of anesthesia or surgery.  Shannon Kelly 09/23/2016, 10:46 AM  \

## 2016-09-23 NOTE — Anesthesia Preprocedure Evaluation (Signed)
Anesthesia Evaluation  Patient identified by MRN, date of birth, ID band  Reviewed: Allergy & Precautions, NPO status , Patient's Chart, lab work & pertinent test results  History of Anesthesia Complications (+) PONV and history of anesthetic complications  Airway Mallampati: I  TM Distance: >3 FB Neck ROM: Full    Dental no notable dental hx.    Pulmonary    Pulmonary exam normal        Cardiovascular negative cardio ROS Normal cardiovascular exam     Neuro/Psych PSYCHIATRIC DISORDERS Depression    GI/Hepatic negative GI ROS, Neg liver ROS,   Endo/Other  negative endocrine ROS  Renal/GU negative Renal ROS     Musculoskeletal  (+) Arthritis , Osteoarthritis,    Abdominal   Peds  Hematology negative hematology ROS (+)   Anesthesia Other Findings   Reproductive/Obstetrics                             Anesthesia Physical Anesthesia Plan  ASA: II  Anesthesia Plan: MAC   Post-op Pain Management:    Induction: Intravenous  Airway Management Planned:   Additional Equipment:   Intra-op Plan:   Post-operative Plan:   Informed Consent: I have reviewed the patients History and Physical, chart, labs and discussed the procedure including the risks, benefits and alternatives for the proposed anesthesia with the patient or authorized representative who has indicated his/her understanding and acceptance.     Plan Discussed with: CRNA  Anesthesia Plan Comments:         Anesthesia Quick Evaluation

## 2016-09-24 ENCOUNTER — Encounter: Payer: Self-pay | Admitting: Cardiovascular Disease

## 2016-09-29 ENCOUNTER — Ambulatory Visit: Payer: Self-pay | Admitting: Cardiology

## 2016-10-05 ENCOUNTER — Other Ambulatory Visit: Payer: Self-pay | Admitting: Family Medicine

## 2016-10-05 ENCOUNTER — Other Ambulatory Visit (INDEPENDENT_AMBULATORY_CARE_PROVIDER_SITE_OTHER): Payer: PPO

## 2016-10-05 DIAGNOSIS — E785 Hyperlipidemia, unspecified: Secondary | ICD-10-CM | POA: Diagnosis not present

## 2016-10-05 DIAGNOSIS — Z1159 Encounter for screening for other viral diseases: Secondary | ICD-10-CM | POA: Diagnosis not present

## 2016-10-05 LAB — LIPID PANEL
CHOL/HDL RATIO: 4
Cholesterol: 212 mg/dL — ABNORMAL HIGH (ref 0–200)
HDL: 55.8 mg/dL (ref >39.00)
LDL CALC: 138 mg/dL — AB (ref 0–99)
NonHDL: 156.65
TRIGLYCERIDES: 91 mg/dL (ref 0.0–149.0)
VLDL: 18.2 mg/dL (ref 0.0–40.0)

## 2016-10-06 LAB — HEPATITIS C ANTIBODY: HCV Ab: NEGATIVE

## 2016-10-07 ENCOUNTER — Ambulatory Visit (INDEPENDENT_AMBULATORY_CARE_PROVIDER_SITE_OTHER): Payer: PPO | Admitting: Family Medicine

## 2016-10-07 ENCOUNTER — Encounter: Payer: Self-pay | Admitting: Family Medicine

## 2016-10-07 VITALS — BP 128/74 | HR 68 | Temp 98.2°F | Wt 162.8 lb

## 2016-10-07 DIAGNOSIS — Z7189 Other specified counseling: Secondary | ICD-10-CM | POA: Diagnosis not present

## 2016-10-07 DIAGNOSIS — M1991 Primary osteoarthritis, unspecified site: Secondary | ICD-10-CM | POA: Diagnosis not present

## 2016-10-07 DIAGNOSIS — F418 Other specified anxiety disorders: Secondary | ICD-10-CM | POA: Diagnosis not present

## 2016-10-07 DIAGNOSIS — R42 Dizziness and giddiness: Secondary | ICD-10-CM | POA: Diagnosis not present

## 2016-10-07 DIAGNOSIS — Z23 Encounter for immunization: Secondary | ICD-10-CM

## 2016-10-07 DIAGNOSIS — R3129 Other microscopic hematuria: Secondary | ICD-10-CM | POA: Diagnosis not present

## 2016-10-07 DIAGNOSIS — E785 Hyperlipidemia, unspecified: Secondary | ICD-10-CM | POA: Diagnosis not present

## 2016-10-07 DIAGNOSIS — F5104 Psychophysiologic insomnia: Secondary | ICD-10-CM

## 2016-10-07 DIAGNOSIS — Z0000 Encounter for general adult medical examination without abnormal findings: Secondary | ICD-10-CM

## 2016-10-07 LAB — URINALYSIS, ROUTINE W REFLEX MICROSCOPIC
Bilirubin Urine: NEGATIVE
KETONES UR: NEGATIVE
Leukocytes, UA: NEGATIVE
Nitrite: NEGATIVE
PH: 6 (ref 5.0–8.0)
RBC / HPF: NONE SEEN (ref 0–?)
Specific Gravity, Urine: <=1.005 — AB (ref 1.000–1.030)
TOTAL PROTEIN, URINE-UPE24: NEGATIVE
URINE GLUCOSE: NEGATIVE
UROBILINOGEN UA: 0.2 (ref 0.0–1.0)
WBC, UA: NONE SEEN (ref 0–?)

## 2016-10-07 LAB — POC URINALSYSI DIPSTICK (AUTOMATED)
Bilirubin, UA: NEGATIVE
Glucose, UA: NEGATIVE
Ketones, UA: NEGATIVE
Leukocytes, UA: NEGATIVE
NITRITE UA: NEGATIVE
PROTEIN UA: NEGATIVE
SPEC GRAV UA: 1.015 (ref 1.030–1.035)
UROBILINOGEN UA: 0.2 (ref ?–2.0)
pH, UA: 6 (ref 5.0–8.0)

## 2016-10-07 MED ORDER — EPINEPHRINE 0.3 MG/0.3ML IJ SOAJ: 0.3000 mg | Freq: Once | INTRAMUSCULAR | 1 refills | Status: AC

## 2016-10-07 NOTE — Progress Notes (Signed)
Pre visit review using our clinic review tool, if applicable. No additional management support is needed unless otherwise documented below in the visit note. 

## 2016-10-07 NOTE — Assessment & Plan Note (Signed)
Chronic, uncontrolled. Pt has been able to taper off ambien and lorazepam. Desires to stay off medds at this time. Reviewed sleep hygiene measures.

## 2016-10-07 NOTE — Patient Instructions (Addendum)
Prevnar today.  Urinalysis today.  Bring me copy of your advanced directive. You are doing well today.  Return as needed or in 1 year for next medicare wellness visit  Health Maintenance, Female Adopting a healthy lifestyle and getting preventive care can go a long way to promote health and wellness. Talk with your health care provider about what schedule of regular examinations is right for you. This is a good chance for you to check in with your provider about disease prevention and staying healthy. In between checkups, there are plenty of things you can do on your own. Experts have done a lot of research about which lifestyle changes and preventive measures are most likely to keep you healthy. Ask your health care provider for more information. Weight and diet Eat a healthy diet  Be sure to include plenty of vegetables, fruits, low-fat dairy products, and lean protein.  Do not eat a lot of foods high in solid fats, added sugars, or salt.  Get regular exercise. This is one of the most important things you can do for your health.  Most adults should exercise for at least 150 minutes each week. The exercise should increase your heart rate and make you sweat (moderate-intensity exercise).  Most adults should also do strengthening exercises at least twice a week. This is in addition to the moderate-intensity exercise. Maintain a healthy weight  Body mass index (BMI) is a measurement that can be used to identify possible weight problems. It estimates body fat based on height and weight. Your health care provider can help determine your BMI and help you achieve or maintain a healthy weight.  For females 80 years of age and older:  A BMI below 18.5 is considered underweight.  A BMI of 18.5 to 24.9 is normal.  A BMI of 25 to 29.9 is considered overweight.  A BMI of 30 and above is considered obese. Watch levels of cholesterol and blood lipids  You should start having your blood tested  for lipids and cholesterol at 65 years of age, then have this test every 5 years.  You may need to have your cholesterol levels checked more often if:  Your lipid or cholesterol levels are high.  You are older than 65 years of age.  You are at high risk for heart disease. Cancer screening Lung Cancer  Lung cancer screening is recommended for adults 7-80 years old who are at high risk for lung cancer because of a history of smoking.  A yearly low-dose CT scan of the lungs is recommended for people who:  Currently smoke.  Have quit within the past 15 years.  Have at least a 30-pack-year history of smoking. A pack year is smoking an average of one pack of cigarettes a day for 1 year.  Yearly screening should continue until it has been 15 years since you quit.  Yearly screening should stop if you develop a health problem that would prevent you from having lung cancer treatment. Breast Cancer  Practice breast self-awareness. This means understanding how your breasts normally appear and feel.  It also means doing regular breast self-exams. Let your health care provider know about any changes, no matter how small.  If you are in your 20s or 30s, you should have a clinical breast exam (CBE) by a health care provider every 1-3 years as part of a regular health exam.  If you are 78 or older, have a CBE every year. Also consider having a breast X-ray (mammogram)  every year.  If you have a family history of breast cancer, talk to your health care provider about genetic screening.  If you are at high risk for breast cancer, talk to your health care provider about having an MRI and a mammogram every year.  Breast cancer gene (BRCA) assessment is recommended for women who have family members with BRCA-related cancers. BRCA-related cancers include:  Breast.  Ovarian.  Tubal.  Peritoneal cancers.  Results of the assessment will determine the need for genetic counseling and BRCA1 and  BRCA2 testing. Cervical Cancer  Your health care provider may recommend that you be screened regularly for cancer of the pelvic organs (ovaries, uterus, and vagina). This screening involves a pelvic examination, including checking for microscopic changes to the surface of your cervix (Pap test). You may be encouraged to have this screening done every 3 years, beginning at age 40.  For women ages 23-65, health care providers may recommend pelvic exams and Pap testing every 3 years, or they may recommend the Pap and pelvic exam, combined with testing for human papilloma virus (HPV), every 5 years. Some types of HPV increase your risk of cervical cancer. Testing for HPV may also be done on women of any age with unclear Pap test results.  Other health care providers may not recommend any screening for nonpregnant women who are considered low risk for pelvic cancer and who do not have symptoms. Ask your health care provider if a screening pelvic exam is right for you.  If you have had past treatment for cervical cancer or a condition that could lead to cancer, you need Pap tests and screening for cancer for at least 20 years after your treatment. If Pap tests have been discontinued, your risk factors (such as having a new sexual partner) need to be reassessed to determine if screening should resume. Some women have medical problems that increase the chance of getting cervical cancer. In these cases, your health care provider may recommend more frequent screening and Pap tests. Colorectal Cancer  This type of cancer can be detected and often prevented.  Routine colorectal cancer screening usually begins at 65 years of age and continues through 65 years of age.  Your health care provider may recommend screening at an earlier age if you have risk factors for colon cancer.  Your health care provider may also recommend using home test kits to check for hidden blood in the stool.  A small camera at the end  of a tube can be used to examine your colon directly (sigmoidoscopy or colonoscopy). This is done to check for the earliest forms of colorectal cancer.  Routine screening usually begins at age 15.  Direct examination of the colon should be repeated every 5-10 years through 65 years of age. However, you may need to be screened more often if early forms of precancerous polyps or small growths are found. Skin Cancer  Check your skin from head to toe regularly.  Tell your health care provider about any new moles or changes in moles, especially if there is a change in a mole's shape or color.  Also tell your health care provider if you have a mole that is larger than the size of a pencil eraser.  Always use sunscreen. Apply sunscreen liberally and repeatedly throughout the day.  Protect yourself by wearing long sleeves, pants, a wide-brimmed hat, and sunglasses whenever you are outside. Heart disease, diabetes, and high blood pressure  High blood pressure causes heart disease and  increases the risk of stroke. High blood pressure is more likely to develop in:  People who have blood pressure in the high end of the normal range (130-139/85-89 mm Hg).  People who are overweight or obese.  People who are African American.  If you are 22-54 years of age, have your blood pressure checked every 3-5 years. If you are 61 years of age or older, have your blood pressure checked every year. You should have your blood pressure measured twice-once when you are at a hospital or clinic, and once when you are not at a hospital or clinic. Record the average of the two measurements. To check your blood pressure when you are not at a hospital or clinic, you can use:  An automated blood pressure machine at a pharmacy.  A home blood pressure monitor.  If you are between 8 years and 3 years old, ask your health care provider if you should take aspirin to prevent strokes.  Have regular diabetes screenings.  This involves taking a blood sample to check your fasting blood sugar level.  If you are at a normal weight and have a low risk for diabetes, have this test once every three years after 65 years of age.  If you are overweight and have a high risk for diabetes, consider being tested at a younger age or more often. Preventing infection Hepatitis B  If you have a higher risk for hepatitis B, you should be screened for this virus. You are considered at high risk for hepatitis B if:  You were born in a country where hepatitis B is common. Ask your health care provider which countries are considered high risk.  Your parents were born in a high-risk country, and you have not been immunized against hepatitis B (hepatitis B vaccine).  You have HIV or AIDS.  You use needles to inject street drugs.  You live with someone who has hepatitis B.  You have had sex with someone who has hepatitis B.  You get hemodialysis treatment.  You take certain medicines for conditions, including cancer, organ transplantation, and autoimmune conditions. Hepatitis C  Blood testing is recommended for:  Everyone born from [birth year] through 1964.  Anyone with known risk factors for hepatitis C. Sexually transmitted infections (STIs)  You should be screened for sexually transmitted infections (STIs) including gonorrhea and chlamydia if:  You are sexually active and are younger than 65 years of age.  You are older than 66 years of age and your health care provider tells you that you are at risk for this type of infection.  Your sexual activity has changed since you were last screened and you are at an increased risk for chlamydia or gonorrhea. Ask your health care provider if you are at risk.  If you do not have HIV, but are at risk, it may be recommended that you take a prescription medicine daily to prevent HIV infection. This is called pre-exposure prophylaxis (PrEP). You are considered at risk if:  You are  sexually active and do not regularly use condoms or know the HIV status of your partner(s).  You take drugs by injection.  You are sexually active with a partner who has HIV. Talk with your health care provider about whether you are at high risk of being infected with HIV. If you choose to begin PrEP, you should first be tested for HIV. You should then be tested every 3 months for as long as you are taking PrEP. Pregnancy  If you are premenopausal and you may become pregnant, ask your health care provider about preconception counseling.  If you may become pregnant, take 400 to 800 micrograms (mcg) of folic acid every day.  If you want to prevent pregnancy, talk to your health care provider about birth control (contraception). Osteoporosis and menopause  Osteoporosis is a disease in which the bones lose minerals and strength with aging. This can result in serious bone fractures. Your risk for osteoporosis can be identified using a bone density scan.  If you are 65 years of age or older, or if you are at risk for osteoporosis and fractures, ask your health care provider if you should be screened.  Ask your health care provider whether you should take a calcium or vitamin D supplement to lower your risk for osteoporosis.  Menopause may have certain physical symptoms and risks.  Hormone replacement therapy may reduce some of these symptoms and risks. Talk to your health care provider about whether hormone replacement therapy is right for you. Follow these instructions at home:  Schedule regular health, dental, and eye exams.  Stay current with your immunizations.  Do not use any tobacco products including cigarettes, chewing tobacco, or electronic cigarettes.  If you are pregnant, do not drink alcohol.  If you are breastfeeding, limit how much and how often you drink alcohol.  Limit alcohol intake to no more than 1 drink per day for nonpregnant women. One drink equals 12 ounces of  beer, 5 ounces of wine, or 1 ounces of hard liquor.  Do not use street drugs.  Do not share needles.  Ask your health care provider for help if you need support or information about quitting drugs.  Tell your health care provider if you often feel depressed.  Tell your health care provider if you have ever been abused or do not feel safe at home. This information is not intended to replace advice given to you by your health care provider. Make sure you discuss any questions you have with your health care provider. Document Released: 01/05/2011 Document Revised: 11/28/2015 Document Reviewed: 03/26/2015 Elsevier Interactive Patient Education  2017 [employer]  

## 2016-10-07 NOTE — Assessment & Plan Note (Addendum)
Stable period. Off lorazepam. Desires to stay off medication at this time.

## 2016-10-07 NOTE — Assessment & Plan Note (Signed)
Chronic, stable off medication. Discussed healthy diet choices to control cholesterol levels. Pt looking into mediterranean food choices.

## 2016-10-07 NOTE — Progress Notes (Signed)
BP 128/74   Pulse 68   Temp 98.2 F (36.8 C) (Oral)   Wt 162 lb 12 oz (73.8 kg)   BMI 26.27 kg/m    CC: CPE Subjective:    Patient ID: Shannon Kelly, female    DOB: 1950/10/28, 66 y.o.   MRN: [id number]  HPI: Shannon Kelly is a 66 y.o. female presenting on 10/07/2016 for Annual Exam   Tough past year - father and brother deceased from prostate cancer. Other brother then deceased from renal cell cancer 04/2016. h/o anxiety, depression. Did not tolerate sertraline. Has stopped lorazepam. She feels mood has overall improved.   Episodes of dizziness/lightheadedness with exercise over the last few months, s/p unrevealing cardiac evaluation with stress echocardiogram. Also had normal carotid dopplers, still pending regular echocardiogram. Off ambien. 0.5mg  lorazepam. She also stopped exercising and hasn't had any further episodes. Has f/u with cards 10/20/2016.   Chronic insomnia - tried and failed multiple medications up to now. (Previous trials of trazodone 50mg  nightly, tylenol PM, melatonin, magnesium, intermittent lorazepam and ambien ineffective in the past, did not tolerate lunesta). Off ambien for last 2 weeks. Off lorazepam 0.5mg  for last 1 week. Averaging between 4-5 hours. Would like to stay off medication at this time. Has good sleep hygiene. No snoring. No PNdyspnea.   Hearing screen passed Vision screen last month with eye doctor [place] screen passed Depression screen passed  Preventative: Colonoscopy - 06/2013 - microscopic colitis, rpt 5 yrs Carlean Purl) Well woman - with OBGYN prior in [place] (Dr. Wyvonne Lenz). S/p vaginal hysterectomy 1993 for fibroids, ovaries remain. Pelvic exam WNL 2017.  Mammogram WNL 08/2016 DEXA 2013 - WNL  Flu shot yearly Prevnar today Tdap - 08/2013 zostavax - 05/2012 Advanced directive - has living will at home. Husband Jaquelyn Bitter is HCPOA. Asked to bring copy.  Seat belt use discussed. Sunscreen use discussed. No changing moles on skin.    Non smoker Alcohol - none  Lives with husband, married, 1 son Had been living in [place], return to [facility] fall 2014 Occupation: Retired, prior worked at [employer]: [employer] Activity: plays with dog, outdoor walking, joined gym  Diet: good water, fruits/vegetables daily  Relevant past medical, surgical, family and social history reviewed and updated as indicated. Interim medical history since our last visit reviewed. Allergies and medications reviewed and updated. Outpatient Medications Prior to Visit  Medication Sig Dispense Refill  . celecoxib (CELEBREX) 200 MG capsule Take 1 capsule (200 mg total) by mouth daily. 90 capsule 1  . cyanocobalamin 500 MCG tablet Take 1 tablet (500 mcg total) by mouth daily.    Marland Kitchen EPINEPHrine (EPIPEN 2-PAK) 0.3 mg/0.3 mL IJ SOAJ injection Inject 0.3 mLs (0.3 mg total) into the muscle once. (Patient not taking: Reported on 10/07/2016) 1 Device 0  . LORazepam (ATIVAN) 1 MG tablet TAKE 1 TABLET BY MOUTH TWICE A DAY A NEEDED (Patient not taking: Reported on 10/07/2016) 60 tablet 2   No facility-administered medications prior to visit.      Per HPI unless specifically indicated in ROS section below Review of Systems     Objective:    BP 128/74   Pulse 68   Temp 98.2 F (36.8 C) (Oral)   Wt 162 lb 12 oz (73.8 kg)   BMI 26.27 kg/m   Wt Readings from Last 3 Encounters:  10/07/16 162 lb 12 oz (73.8 kg)  09/23/16 156 lb (70.8 kg)  09/18/16 161 lb (73 kg)    Physical  Exam  Constitutional: She is oriented to person, place, and time. She appears well-developed and well-nourished. No distress.  HENT:  Head: Normocephalic and atraumatic.  Right Ear: Hearing, tympanic membrane, external ear and ear canal normal.  Left Ear: Hearing, tympanic membrane, external ear and ear canal normal.  Nose: Nose normal.  Mouth/Throat: Uvula is midline, oropharynx is clear and moist and mucous membranes are normal. No oropharyngeal exudate, posterior  oropharyngeal edema or posterior oropharyngeal erythema.  Eyes: Conjunctivae and EOM are normal. Pupils are equal, round, and reactive to light. No scleral icterus.  Neck: Normal range of motion. Neck supple. Carotid bruit is not present. No thyromegaly present.  Cardiovascular: Normal rate, regular rhythm, normal heart sounds and intact distal pulses.   No murmur heard. Pulses:      Radial pulses are 2+ on the right side, and 2+ on the left side.  Pulmonary/Chest: Effort normal and breath sounds normal. No respiratory distress. She has no wheezes. She has no rales.  Abdominal: Soft. Bowel sounds are normal. She exhibits no distension and no mass. There is no tenderness. There is no rebound and no guarding.  Musculoskeletal: Normal range of motion. She exhibits no edema.  Lymphadenopathy:    She has no cervical adenopathy.  Neurological: She is alert and oriented to person, place, and time.  CN grossly intact, station and gait intact Recall 3/3  Calculation 5/5 serial 7s   Skin: Skin is warm and dry. No rash noted.  Psychiatric: She has a normal mood and affect. Her behavior is normal. Judgment and thought content normal.  Nursing note and vitals reviewed.  Results for orders placed or performed in visit on 10/05/16  Lipid panel  Result Value Ref Range   Cholesterol 212 (H) 0 - 200 mg/dL   Triglycerides 91.0 0.0 - 149.0 mg/dL   HDL 55.80 >39.00 mg/dL   VLDL 18.2 0.0 - 40.0 mg/dL   LDL Cholesterol 138 (H) 0 - 99 mg/dL   Total CHOL/HDL Ratio 4    NonHDL 156.65   Hepatitis C antibody  Result Value Ref Range   HCV Ab NEGATIVE NEGATIVE      Assessment & Plan:   Problem List Items Addressed This Visit    Advanced care planning/counseling discussion    Advanced directive - has living will at home. Husband Jaquelyn Bitter is HCPOA. Asked to bring copy.       Chronic insomnia    Chronic, uncontrolled. Pt has been able to taper off ambien and lorazepam. Desires to stay off medds at this  time. Reviewed sleep hygiene measures.       Depression with anxiety    Stable period. Off lorazepam. Desires to stay off medication at this time.       Dizziness    No further symptoms since she stopped exercise class. Attributed to med side effects (ambien, lorazepam). Will f/u with cards - so far unrevealing evaluation. Discussed may slowly restart aerobic exercise (walking, stationary bicycle).       HLD (hyperlipidemia)    Chronic, stable off medication. Discussed healthy diet choices to control cholesterol levels. Pt looking into mediterranean food choices.       Relevant Medications   EPINEPHrine (EPIPEN 2-PAK) 0.3 mg/0.3 mL IJ SOAJ injection   Microhematuria    Recheck UA today.       Osteoarthritis    Chronic, stable on celebrex.      Welcome to Medicare preventive visit - Primary    I have personally reviewed the  Medicare Annual Wellness questionnaire and have noted 1. The patient's medical and social history 2. Their use of alcohol, tobacco or illicit drugs 3. Their current medications and supplements 4. The patient's functional ability including ADL's, fall risks, home safety risks and hearing or visual impairment. Cognitive function has been assessed and addressed as indicated.  5. Diet and physical activity 6. Evidence for depression or mood disorders The patients weight, height, BMI have been recorded in the chart. I have made referrals, counseling and provided education to the patient based on review of the above and I have provided the pt with a written personalized care plan for preventive services. Provider list updated.. See scanned questionairre as needed for further documentation. Reviewed preventative protocols and updated unless pt declined.        Other Visit Diagnoses    Need for vaccination with 13-polyvalent pneumococcal conjugate vaccine       Relevant Orders   Pneumococcal conjugate vaccine 13-valent IM (Completed)       Follow up  plan: Return in about 1 year (around 10/07/2017) for medicare wellness visit.  Ria Bush, MD

## 2016-10-07 NOTE — Assessment & Plan Note (Signed)
Recheck UA today.  

## 2016-10-07 NOTE — Assessment & Plan Note (Signed)
Advanced directive - has living will at home. Husband Jaquelyn Bitter is HCPOA. Asked to bring copy.

## 2016-10-07 NOTE — Assessment & Plan Note (Signed)
No further symptoms since she stopped exercise class. Attributed to med side effects (ambien, lorazepam). Will f/u with cards - so far unrevealing evaluation. Discussed may slowly restart aerobic exercise (walking, stationary bicycle).

## 2016-10-07 NOTE — Assessment & Plan Note (Signed)
Chronic, stable on celebrex.

## 2016-10-07 NOTE — Addendum Note (Signed)
Addended by: Royann Shivers A on: 10/07/2016 01:47 PM   Modules accepted: Orders

## 2016-10-07 NOTE — Assessment & Plan Note (Signed)

## 2016-10-07 NOTE — Addendum Note (Signed)
Addended by: Royann Shivers A on: 10/07/2016 01:03 PM   Modules accepted: Orders

## 2016-10-20 ENCOUNTER — Encounter: Payer: Self-pay | Admitting: Cardiovascular Disease

## 2016-10-20 ENCOUNTER — Ambulatory Visit (INDEPENDENT_AMBULATORY_CARE_PROVIDER_SITE_OTHER): Payer: PPO | Admitting: Cardiovascular Disease

## 2016-10-20 VITALS — BP 130/82 | HR 68 | Ht 66.0 in | Wt 160.0 lb

## 2016-10-20 DIAGNOSIS — R55 Syncope and collapse: Secondary | ICD-10-CM

## 2016-10-20 NOTE — Patient Instructions (Signed)
Medication Instructions: No change.   Labwork: None.   Procedures/Testing: None.   Follow-Up: As needed with Dr. Arida.   Any Additional Special Instructions Will Be Listed Below (If Applicable).     If you need a refill on your cardiac medications before your next appointment, please call your pharmacy.   

## 2016-10-20 NOTE — Progress Notes (Signed)
Cardiology Office Note   Date:  10/20/2016   ID:  Shannon Kelly, DOB 1951/02/14, MRN [id number]  PCP:  Ria Bush, MD  Cardiologist:   Kathlyn Sacramento, MD   Chief Complaint  Patient presents with  . other    Follow up from stress echo and carotid u/s. Meds reviewed by the pt. verbally. "doing well."       History of Present Illness: Shannon Kelly is a 66 y.o. female who is here today for a follow-up visit regarding exertional dizziness. She has known history of anxiety and hyperlipidemia. No prior cardiac history. She was seen recently for episodes of dizziness, shaking and presyncope since January which all happened during exercise. These symptoms lasted for about 15-20 minutes and improved with rest. She did not have chest pain during these episodes and there was no loss of consciousness. She has been under increased stress over the last year as she lost 2 brothers and her father to cancer. She is a lifelong nonsmoker and there is no family history of premature coronary artery disease. She underwent a stress echocardiogram which showed no evidence of ischemia but she had hypertensive response to exercise with resting hypertension. Carotid Doppler was normal. Echocardiogram was done and it was reviewed by me today. It was normal overall. The report for some reason did not make it to [facility]. She reports no further episodes. She resumed exercising recently    Past Medical History:  Diagnosis Date  . Arthritis   . Depression   . Diverticula of colon    sigmoid  . HLD (hyperlipidemia)   . Insomnia   . 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 Microhematuria 06/2013   stable CT and [place], seemed to resolve, released from uro care 03/2014 [facility])  . Polyp, sigmoid colon    hyperplastic  . PONV (postoperative nausea and vomiting)    OK after last cataract surgery  . Postmenopausal atrophic vaginitis    responded to vagifem  . Urine incontinence     Past Surgical History:    Procedure Laterality Date  . 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 CATARACT EXTRACTION W/ INTRAOCULAR LENS IMPLANT Right 07/12/2013   Dr. Wallace Going  . CATARACT EXTRACTION W/PHACO Left 09/23/2016   Procedure: CATARACT EXTRACTION PHACO AND INTRAOCULAR LENS PLACEMENT ([place])  Left;  Surgeon: Leandrew Koyanagi, MD;  Location: [facility];  Service: Ophthalmology;  Laterality: Left;  . COLONOSCOPY  06/2013   microscopic colitis, rpt 5 yrs Carlean Purl)  . DEXA  05/2012   normal  . SHOULDER SURGERY Right 2005   Calcium deposit in tendon  . TUBAL LIGATION  1980  . URETHRAL SLING  08/27/2009   [facility]  . VAGINAL HYSTERECTOMY  1993   fibroids, ovaries remained ([facility])     Current Outpatient Prescriptions  Medication Sig Dispense Refill  . celecoxib (CELEBREX) 200 MG capsule Take 1 capsule (200 mg total) by mouth daily. 90 capsule 1  . cyanocobalamin 500 MCG tablet Take 1 tablet (500 mcg total) by mouth daily.     No current facility-administered medications for this visit.     Allergies:   Bee venom and Lunesta [eszopiclone]    Social History:  The patient  reports that she has never smoked. She has never used smokeless tobacco. She reports that she does not drink alcohol or use drugs.   Family History:  The patient's family history includes Breast cancer in her maternal aunt; CAD  in her maternal uncle; CAD (age of onset: 16) in her father; Cancer in her cousin, cousin, father, maternal uncle, and maternal uncle; Cancer (age of onset: 82) in her brother; Cancer (age of onset: 6) in her cousin; Cancer (age of onset: 68) in her brother; Cancer (age of onset: 31) in her mother; Diabetes in her brother and father.    ROS:  Please see the history of present illness.   Otherwise, review of systems are positive for none.   All other systems are reviewed and negative.    PHYSICAL EXAM: VS:  BP 130/82 (BP Location: Left Arm, Patient Position:  Sitting, Cuff Size: Normal)   Pulse 68   Ht 5\' 6"  (1.676 m)   Wt 160 lb (72.6 kg)   BMI 25.82 kg/m  , BMI Body mass index is 25.82 kg/m. GEN: Well nourished, well developed, in no acute distress  HEENT: normal  Neck: no JVD, carotid bruits, or masses Cardiac: RRR; no murmurs, rubs, or gallops,no edema  Respiratory:  clear to auscultation bilaterally, normal work of breathing GI: soft, nontender, nondistended, + BS MS: no deformity or atrophy  Skin: warm and dry, no rash Neuro:  Strength and sensation are intact Psych: euthymic mood, full affect   EKG:  EKG is not ordered today.    Recent Labs: 09/04/2016: BUN 18; Creatinine, Ser 0.92; Hemoglobin 12.3; Platelets 299.0; Potassium 4.4; Sodium 139; TSH 1.71    Lipid Panel    Component Value Date/Time   CHOL 212 (H) 10/05/2016 0829   CHOL 224 04/27/2012   TRIG 91.0 10/05/2016 0829   TRIG 114 04/27/2012   HDL 55.80 10/05/2016 0829   CHOLHDL 4 10/05/2016 0829   VLDL 18.2 10/05/2016 0829   LDLCALC 138 (H) 10/05/2016 0829   LDLCALC 143 04/27/2012   LDLDIRECT 162.1 06/07/2013 1051      Wt Readings from Last 3 Encounters:  10/20/16 160 lb (72.6 kg)  10/07/16 162 lb 12 oz (73.8 kg)  09/23/16 156 lb (70.8 kg)      PAD Screen 09/15/2016  Previous PAD dx? No  Previous surgical procedure? No  Pain with walking? No  Feet/toe relief with dangling? Yes  Painful, non-healing ulcers? No  Extremities discolored? No      ASSESSMENT AND PLAN:  1.  Exertional dizziness and presyncope:  Resolved and still unclear etiology. Cardiac workup is unremarkable including stress test and echocardiogram. Carotid Doppler also was normal. The patient can resume exercise gradually and can follow-up with me as needed.   Disposition:   FU with me as needed .  Signed,  Kathlyn Sacramento, MD  10/20/2016 2:28 PM    [place]

## 2016-10-27 ENCOUNTER — Encounter: Payer: Self-pay | Admitting: Family Medicine

## 2016-11-02 DIAGNOSIS — Z961 Presence of intraocular lens: Secondary | ICD-10-CM | POA: Diagnosis not present

## 2017-01-29 DIAGNOSIS — L570 Actinic keratosis: Secondary | ICD-10-CM | POA: Diagnosis not present

## 2017-01-29 DIAGNOSIS — H01134 Eczematous dermatitis of left upper eyelid: Secondary | ICD-10-CM | POA: Diagnosis not present

## 2017-01-29 DIAGNOSIS — D225 Melanocytic nevi of trunk: Secondary | ICD-10-CM | POA: Diagnosis not present

## 2017-01-29 DIAGNOSIS — H01131 Eczematous dermatitis of right upper eyelid: Secondary | ICD-10-CM | POA: Diagnosis not present

## 2017-01-29 DIAGNOSIS — D2272 Melanocytic nevi of left lower limb, including hip: Secondary | ICD-10-CM | POA: Diagnosis not present

## 2017-01-29 DIAGNOSIS — X32XXXA Exposure to sunlight, initial encounter: Secondary | ICD-10-CM | POA: Diagnosis not present

## 2017-04-20 ENCOUNTER — Other Ambulatory Visit: Payer: Self-pay | Admitting: Family Medicine

## 2017-04-20 NOTE — Telephone Encounter (Signed)
Last filled:  01/18/17, #90 Last OV (IPPE):  10/07/16 Next OV:  none

## 2017-04-23 NOTE — Telephone Encounter (Signed)
Rx sent to pharmacy   

## 2017-05-03 DIAGNOSIS — H43813 Vitreous degeneration, bilateral: Secondary | ICD-10-CM | POA: Diagnosis not present

## 2017-05-11 ENCOUNTER — Encounter: Payer: Self-pay | Admitting: Family Medicine

## 2017-05-13 ENCOUNTER — Other Ambulatory Visit: Payer: Self-pay | Admitting: Family Medicine

## 2017-05-13 DIAGNOSIS — R202 Paresthesia of skin: Secondary | ICD-10-CM

## 2017-05-17 ENCOUNTER — Encounter: Payer: Self-pay | Admitting: Family Medicine

## 2017-05-17 NOTE — Telephone Encounter (Signed)
Left message on vm per dpr asking pt to call back with clarification if she wants an appt at our office or with Dr. Silvano Rusk.  If she is requesting appt with [facility], she can schedule when she calls back.

## 2017-05-17 NOTE — Telephone Encounter (Signed)
Was told by front office, the pt called back and was wanting [place] to schedule an appt with Dr. Celesta Aver office for her. It was explained to pt she would need to contact them directly.

## 2017-05-19 DIAGNOSIS — G5601 Carpal tunnel syndrome, right upper limb: Secondary | ICD-10-CM | POA: Diagnosis not present

## 2017-05-31 ENCOUNTER — Ambulatory Visit: Payer: Self-pay | Admitting: Physician Assistant

## 2017-06-21 DIAGNOSIS — G5601 Carpal tunnel syndrome, right upper limb: Secondary | ICD-10-CM | POA: Diagnosis not present

## 2017-08-06 ENCOUNTER — Other Ambulatory Visit: Payer: Self-pay | Admitting: Family Medicine

## 2017-08-06 DIAGNOSIS — Z1231 Encounter for screening mammogram for malignant neoplasm of breast: Secondary | ICD-10-CM

## 2017-08-09 ENCOUNTER — Other Ambulatory Visit: Payer: Self-pay | Admitting: Family Medicine

## 2017-08-09 NOTE — Telephone Encounter (Signed)
Lorazepam last filled:  04/22/17, #90 Celebrex last filled:  04/22/17, #90 Last OV (CPE):  10/07/16 Next OV:  10/13/17

## 2017-08-11 NOTE — Telephone Encounter (Signed)
setn electronically

## 2017-08-25 ENCOUNTER — Ambulatory Visit: Payer: Self-pay

## 2017-09-13 ENCOUNTER — Ambulatory Visit
Admission: RE | Admit: 2017-09-13 | Discharge: 2017-09-13 | Disposition: A | Discharge status: Home / Self Care | Payer: PPO | Source: Ambulatory Visit | Attending: Family Medicine | Admitting: Family Medicine

## 2017-09-13 DIAGNOSIS — Z1231 Encounter for screening mammogram for malignant neoplasm of breast: Secondary | ICD-10-CM | POA: Diagnosis not present

## 2017-09-14 LAB — HM MAMMOGRAPHY

## 2017-09-15 ENCOUNTER — Encounter: Payer: Self-pay | Admitting: Family Medicine

## 2017-10-05 ENCOUNTER — Other Ambulatory Visit: Payer: Self-pay | Admitting: Family Medicine

## 2017-10-05 DIAGNOSIS — E785 Hyperlipidemia, unspecified: Secondary | ICD-10-CM

## 2017-10-06 ENCOUNTER — Other Ambulatory Visit (INDEPENDENT_AMBULATORY_CARE_PROVIDER_SITE_OTHER): Payer: PPO

## 2017-10-06 DIAGNOSIS — E785 Hyperlipidemia, unspecified: Secondary | ICD-10-CM

## 2017-10-06 LAB — BASIC METABOLIC PANEL
BUN: 19 mg/dL (ref 6–23)
CALCIUM: 9.5 mg/dL (ref 8.4–10.5)
CO2: 28 meq/L (ref 19–32)
Chloride: 106 mEq/L (ref 96–112)
Creatinine, Ser: 0.96 mg/dL (ref 0.40–1.20)
GFR: 61.79 mL/min (ref >60.00)
GLUCOSE: 90 mg/dL (ref 70–99)
Potassium: 4.8 mEq/L (ref 3.5–5.1)
SODIUM: 139 meq/L (ref 135–145)

## 2017-10-06 LAB — LIPID PANEL
CHOL/HDL RATIO: 4
Cholesterol: 203 mg/dL — ABNORMAL HIGH (ref 0–200)
HDL: 54.2 mg/dL (ref >39.00)
LDL CALC: 125 mg/dL — AB (ref 0–99)
NONHDL: 149.11
Triglycerides: 120 mg/dL (ref 0.0–149.0)
VLDL: 24 mg/dL (ref 0.0–40.0)

## 2017-10-13 ENCOUNTER — Ambulatory Visit (INDEPENDENT_AMBULATORY_CARE_PROVIDER_SITE_OTHER): Payer: PPO | Admitting: Family Medicine

## 2017-10-13 ENCOUNTER — Encounter: Payer: Self-pay | Admitting: Family Medicine

## 2017-10-13 VITALS — BP 122/78 | HR 68 | Temp 97.8°F | Ht 65.5 in | Wt 163.0 lb

## 2017-10-13 DIAGNOSIS — Z0000 Encounter for general adult medical examination without abnormal findings: Secondary | ICD-10-CM | POA: Diagnosis not present

## 2017-10-13 DIAGNOSIS — F5104 Psychophysiologic insomnia: Secondary | ICD-10-CM

## 2017-10-13 DIAGNOSIS — E785 Hyperlipidemia, unspecified: Secondary | ICD-10-CM

## 2017-10-13 DIAGNOSIS — Z7189 Other specified counseling: Secondary | ICD-10-CM

## 2017-10-13 DIAGNOSIS — M1991 Primary osteoarthritis, unspecified site: Secondary | ICD-10-CM

## 2017-10-13 DIAGNOSIS — R32 Unspecified urinary incontinence: Secondary | ICD-10-CM

## 2017-10-13 DIAGNOSIS — G5601 Carpal tunnel syndrome, right upper limb: Secondary | ICD-10-CM

## 2017-10-13 DIAGNOSIS — Z23 Encounter for immunization: Secondary | ICD-10-CM | POA: Diagnosis not present

## 2017-10-13 NOTE — Assessment & Plan Note (Signed)
Advanced directive - has living will at home. Husband Jaquelyn Bitter is HCPOA. Copy dated 53 in chart (08/2017) - planning on redoing this next month.

## 2017-10-13 NOTE — Assessment & Plan Note (Signed)

## 2017-10-13 NOTE — Assessment & Plan Note (Signed)
Chronic, stable off medication.  The 10-year ASCVD risk score Mikey Bussing DC Brooke Bonito., et al., 2013) is: 5.7%   Values used to calculate the score:     Age: 67 years     Sex: Female     Is Non-Hispanic African American: No     Diabetic: No     Tobacco smoker: No     Systolic Blood Pressure: 633 mmHg     Is BP treated: No     HDL Cholesterol: 54.2 mg/dL     Total Cholesterol: 203 mg/dL

## 2017-10-13 NOTE — Assessment & Plan Note (Signed)
Managing with nightly wrist brace.

## 2017-10-13 NOTE — Addendum Note (Signed)
Addended by: Brenton Grills on: 4/56/2563 89:37 PM   Modules accepted: Orders

## 2017-10-13 NOTE — Assessment & Plan Note (Signed)
Chronic, stable on celebrex - continue.

## 2017-10-13 NOTE — Assessment & Plan Note (Signed)
Preventative protocols reviewed and updated unless pt declined. Discussed healthy diet and lifestyle.  

## 2017-10-13 NOTE — Assessment & Plan Note (Signed)
Not currently an issue

## 2017-10-13 NOTE — Assessment & Plan Note (Signed)
Managed with tylenol PM and infrequent lorazepam use

## 2017-10-13 NOTE — Progress Notes (Signed)
BP 122/78 (BP Location: Left Arm, Patient Position: Sitting, Cuff Size: Normal)   Pulse 68   Temp 97.8 F (36.6 C) (Oral)   Ht 5' 5.5" (1.664 m)   Wt 163 lb (73.9 kg)   SpO2 98%   BMI 26.71 kg/m    CC: medicare wellness visit, initial Subjective:    Patient ID: Shannon Kelly, female    DOB: 1950/09/06, 67 y.o.   MRN: [id number]  HPI: Shannon Kelly is a 67 y.o. female presenting on 10/13/2017 for Medicare Wellness   Saw Dr Shannon Kelly for R hand pain - ?CTS wearing wrist brace at night with benefit.   Chronic insomnia - tried and failed multiple medications up to now. (Previous trials of trazodone 50mg  nightly, tylenol PM, melatonin, magnesium, intermittent lorazepam and ambien ineffective in the past, did not tolerate lunesta). Off ambien, rare lorazepam 0.5mg  use along with tylenol PM. Averaging between 4-5 hours. Has good sleep hygiene. No snoring. No PNdyspnea.   Voiding well stooling normally.    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  Well woman - with OBGYN prior in [place] (Dr. Wyvonne Kelly). S/p vaginal hysterectomy 1993 for fibroids, ovaries remain. Pelvic exam WNL 2017.  Mammogram WNL 09/2017 DEXA 2013 - WNL. Good milk intake, leafy greens, regular weight bearing exercises.  Flu shot yearly Prevnar 2018, pneumovax today Tdap - 08/2013 zostavax - 05/2012 shingrix - discussed Advanced directive - has living will at home. Husband Shannon Kelly is HCPOA. Copy dated 30 in chart (08/2017) - planning on redoing this next month.  Seat belt use discussed. Sunscreen use discussed. No changing moles on skin.  Non smoker Alcohol - rare  Lives with husband, married, 1 son Had been living in [place], return to [facility] fall 2014 Occupation: Retired, prior worked at [employer]: [employer] Activity: plays with dog, outdoor walking, joined gym through [employer] Diet: good water, fruits/vegetables daily  Relevant past medical, surgical, family and social history reviewed and updated as indicated. Interim medical history since our last visit reviewed. Allergies and medications reviewed and updated. Outpatient Medications Prior to Visit  Medication Sig Dispense Refill  . Biotin 5000 MCG CAPS Take 1 capsule by mouth daily.    . celecoxib (CELEBREX) 200 MG capsule TAKE 1 CAPSULE BY MOUTH ONCE DAILY 90 capsule 0  . cyanocobalamin 500 MCG tablet Take 1 tablet (500 mcg total) by mouth daily. (Patient taking differently: Take 500 mcg by mouth daily. Takes 2-3 times a week)    . diphenhydrAMINE-APAP, sleep, (TYLENOL PM EXTRA STRENGTH PO) Take 500 mg by mouth at bedtime.    Marland Kitchen LORazepam (ATIVAN) 1 MG tablet TAKE 1 TABLET BY MOUTH TWICE A DAY AS NEEDED 60 tablet 0   No facility-administered medications prior to visit.      Per HPI unless specifically indicated in ROS section below Review of Systems  Constitutional: Negative for activity change, appetite change, chills, fatigue, fever and unexpected weight change.  HENT: Negative for hearing loss.   Eyes: Negative for visual disturbance.  Respiratory: Negative for cough, chest tightness, shortness of breath and wheezing.   Cardiovascular: Negative for chest pain, palpitations and  leg swelling.  Gastrointestinal: Negative for abdominal distention, abdominal pain, blood in stool, constipation, diarrhea, nausea and vomiting.  Genitourinary: Negative for difficulty urinating and hematuria.  Musculoskeletal: Negative for arthralgias, myalgias and neck pain.  Skin: Negative for rash.  Neurological: Negative for dizziness, seizures, syncope and headaches.  Hematological: Negative for adenopathy. Bruises/bleeds easily.    Psychiatric/Behavioral: Negative for dysphoric mood. The patient is not nervous/anxious.        Objective:    BP 122/78 (BP Location: Left Arm, Patient Position: Sitting, Cuff Size: Normal)   Pulse 68   Temp 97.8 F (36.6 C) (Oral)   Ht 5' 5.5" (1.664 m)   Wt 163 lb (73.9 kg)   SpO2 98%   BMI 26.71 kg/m   Wt Readings from Last 3 Encounters:  10/13/17 163 lb (73.9 kg)  10/20/16 160 lb (72.6 kg)  10/07/16 162 lb 12 oz (73.8 kg)    Physical Exam  Constitutional: She is oriented to person, place, and time. She appears well-developed and well-nourished. No distress.  HENT:  Head: Normocephalic and atraumatic.  Right Ear: Hearing, tympanic membrane, external ear and ear canal normal.  Left Ear: Hearing, tympanic membrane, external ear and ear canal normal.  Nose: Nose normal.  Mouth/Throat: Uvula is midline, oropharynx is clear and moist and mucous membranes are normal. No oropharyngeal exudate, posterior oropharyngeal edema or posterior oropharyngeal erythema.  Eyes: Pupils are equal, round, and reactive to light. Conjunctivae and EOM are normal. No scleral icterus.  Neck: Normal range of motion. Neck supple. Carotid bruit is not present. No thyromegaly present.  Cardiovascular: Normal rate, regular rhythm, normal heart sounds and intact distal pulses.  No murmur heard. Pulses:      Radial pulses are 2+ on the right side, and 2+ on the left side.  Pulmonary/Chest: Effort normal and breath sounds normal. No respiratory distress. She has no wheezes. She has no rales.  Abdominal: Soft. Bowel sounds are normal. She exhibits no distension and no mass. There is no tenderness. There is no rebound and no guarding.  Musculoskeletal: Normal range of motion. She exhibits no edema.  Lymphadenopathy:    She has no cervical adenopathy.  Neurological: She is alert and oriented to person, place, and time.  CN grossly intact, station and gait intact Recall 3/3 Calculation 5/5 serial 7s  Skin:  Skin is warm and dry. No rash noted.  Psychiatric: She has a normal mood and affect. Her behavior is normal. Judgment and thought content normal.  Nursing note and vitals reviewed.  Results for orders placed or performed in visit on 78/29/56  Basic metabolic panel  Result Value Ref Range   Sodium 139 135 - 145 mEq/L   Potassium 4.8 3.5 - 5.1 mEq/L   Chloride 106 96 - 112 mEq/L   CO2 28 19 - 32 mEq/L   Glucose, Bld 90 70 - 99 mg/dL   BUN 19 6 - 23 mg/dL   Creatinine, Ser 0.96 0.40 - 1.20 mg/dL   Calcium 9.5 8.4 - 10.5 mg/dL   GFR 61.79 >60.00 mL/min  Lipid panel  Result Value Ref Range   Cholesterol 203 (H) 0 - 200 mg/dL   Triglycerides 120.0 0.0 - 149.0 mg/dL   HDL 54.20 >39.00 mg/dL   VLDL 24.0 0.0 - 40.0 mg/dL   LDL Cholesterol 125 (H) 0 - 99 mg/dL   Total CHOL/HDL Ratio 4    NonHDL 149.11       Assessment & Plan:   Problem List Items Addressed  This Visit    Advanced care planning/counseling discussion    Advanced directive - has living will at home. Husband Shannon Kelly is HCPOA. Copy dated 12 in chart (08/2017) - planning on redoing this next month.       Carpal tunnel syndrome on right    Managing with nightly wrist brace.       Relevant Medications   diphenhydrAMINE-APAP, sleep, (TYLENOL PM EXTRA STRENGTH PO)   Chronic insomnia    Managed with tylenol PM and infrequent lorazepam use      Health care maintenance    Preventative protocols reviewed and updated unless pt declined. Discussed healthy diet and lifestyle.       HLD (hyperlipidemia)    Chronic, stable off medication.  The 10-year ASCVD risk score Mikey Bussing DC Brooke Bonito., et al., 2013) is: 5.7%   Values used to calculate the score:     Age: 25 years     Sex: Female     Is Non-Hispanic African American: No     Diabetic: No     Tobacco smoker: No     Systolic Blood Pressure: 372 mmHg     Is BP treated: No     HDL Cholesterol: 54.2 mg/dL     Total Cholesterol: 203 mg/dL       Medicare annual wellness visit,  initial - Primary    I have personally reviewed the Medicare Annual Wellness questionnaire and have noted 1. The patient's medical and social history 2. Their use of alcohol, tobacco or illicit drugs 3. Their current medications and supplements 4. The patient's functional ability including ADL's, fall risks, home safety risks and hearing or visual impairment. Cognitive function has been assessed and addressed as indicated.  5. Diet and physical activity 6. Evidence for depression or mood disorders The patients weight, height, BMI have been recorded in the chart. I have made referrals, counseling and provided education to the patient based on review of the above and I have provided the pt with a written personalized care plan for preventive services. Provider list updated.. See scanned questionairre as needed for further documentation. Reviewed preventative protocols and updated unless pt declined.       Osteoarthritis    Chronic, stable on celebrex - continue.      Urine incontinence    Not currently an issue.           No orders of the defined types were placed in this encounter.  No orders of the defined types were placed in this encounter.   Follow up plan: Return in about 1 year (around 10/14/2018) for annual exam, prior fasting for blood work, medicare wellness visit.  Ria Bush, MD

## 2017-10-13 NOTE — Patient Instructions (Addendum)
Pneumovax today.  If interested, check with pharmacy about new 2 shot shingles series (shingrix).  Return as needed or in 1 year for next medicare wellness visit/physical.  Bring me copy of updated advanced directive if changes.  Health Maintenance, Female Adopting a healthy lifestyle and getting preventive care can go a long way to promote health and wellness. Talk with your health care provider about what schedule of regular examinations is right for you. This is a good chance for you to check in with your provider about disease prevention and staying healthy. In between checkups, there are plenty of things you can do on your own. Experts have done a lot of research about which lifestyle changes and preventive measures are most likely to keep you healthy. Ask your health care provider for more information. Weight and diet Eat a healthy diet  Be sure to include plenty of vegetables, fruits, low-fat dairy products, and lean protein.  Do not eat a lot of foods high in solid fats, added sugars, or salt.  Get regular exercise. This is one of the most important things you can do for your health. ? Most adults should exercise for at least 150 minutes each week. The exercise should increase your heart rate and make you sweat (moderate-intensity exercise). ? Most adults should also do strengthening exercises at least twice a week. This is in addition to the moderate-intensity exercise.  Maintain a healthy weight  Body mass index (BMI) is a measurement that can be used to identify possible weight problems. It estimates body fat based on height and weight. Your health care provider can help determine your BMI and help you achieve or maintain a healthy weight.  For females 80 years of age and older: ? A BMI below 18.5 is considered underweight. ? A BMI of 18.5 to 24.9 is normal. ? A BMI of 25 to 29.9 is considered overweight. ? A BMI of 30 and above is considered obese.  Watch levels of cholesterol  and blood lipids  You should start having your blood tested for lipids and cholesterol at 66 years of age, then have this test every 5 years.  You may need to have your cholesterol levels checked more often if: ? Your lipid or cholesterol levels are high. ? You are older than 66 years of age. ? You are at high risk for heart disease.  Cancer screening Lung Cancer  Lung cancer screening is recommended for adults 86-42 years old who are at high risk for lung cancer because of a history of smoking.  A yearly low-dose CT scan of the lungs is recommended for people who: ? Currently smoke. ? Have quit within the past 15 years. ? Have at least a 30-pack-year history of smoking. A pack year is smoking an average of one pack of cigarettes a day for 1 year.  Yearly screening should continue until it has been 15 years since you quit.  Yearly screening should stop if you develop a health problem that would prevent you from having lung cancer treatment.  Breast Cancer  Practice breast self-awareness. This means understanding how your breasts normally appear and feel.  It also means doing regular breast self-exams. Let your health care provider know about any changes, no matter how small.  If you are in your 20s or 30s, you should have a clinical breast exam (CBE) by a health care provider every 1-3 years as part of a regular health exam.  If you are 40 or older, have  a CBE every year. Also consider having a breast X-ray (mammogram) every year.  If you have a family history of breast cancer, talk to your health care provider about genetic screening.  If you are at high risk for breast cancer, talk to your health care provider about having an MRI and a mammogram every year.  Breast cancer gene (BRCA) assessment is recommended for women who have family members with BRCA-related cancers. BRCA-related cancers include: ? Breast. ? Ovarian. ? Tubal. ? Peritoneal cancers.  Results of the  assessment will determine the need for genetic counseling and BRCA1 and BRCA2 testing.  Cervical Cancer Your health care provider may recommend that you be screened regularly for cancer of the pelvic organs (ovaries, uterus, and vagina). This screening involves a pelvic examination, including checking for microscopic changes to the surface of your cervix (Pap test). You may be encouraged to have this screening done every 3 years, beginning at age 88.  For women ages 63-65, health care providers may recommend pelvic exams and Pap testing every 3 years, or they may recommend the Pap and pelvic exam, combined with testing for human papilloma virus (HPV), every 5 years. Some types of HPV increase your risk of cervical cancer. Testing for HPV may also be done on women of any age with unclear Pap test results.  Other health care providers may not recommend any screening for nonpregnant women who are considered low risk for pelvic cancer and who do not have symptoms. Ask your health care provider if a screening pelvic exam is right for you.  If you have had past treatment for cervical cancer or a condition that could lead to cancer, you need Pap tests and screening for cancer for at least 20 years after your treatment. If Pap tests have been discontinued, your risk factors (such as having a new sexual partner) need to be reassessed to determine if screening should resume. Some women have medical problems that increase the chance of getting cervical cancer. In these cases, your health care provider may recommend more frequent screening and Pap tests.  Colorectal Cancer  This type of cancer can be detected and often prevented.  Routine colorectal cancer screening usually begins at 66 years of age and continues through 66 years of age.  Your health care provider may recommend screening at an earlier age if you have risk factors for colon cancer.  Your health care provider may also recommend using home test  kits to check for hidden blood in the stool.  A small camera at the end of a tube can be used to examine your colon directly (sigmoidoscopy or colonoscopy). This is done to check for the earliest forms of colorectal cancer.  Routine screening usually begins at age 59.  Direct examination of the colon should be repeated every 5-10 years through 66 years of age. However, you may need to be screened more often if early forms of precancerous polyps or small growths are found.  Skin Cancer  Check your skin from head to toe regularly.  Tell your health care provider about any new moles or changes in moles, especially if there is a change in a mole's shape or color.  Also tell your health care provider if you have a mole that is larger than the size of a pencil eraser.  Always use sunscreen. Apply sunscreen liberally and repeatedly throughout the day.  Protect yourself by wearing long sleeves, pants, a wide-brimmed hat, and sunglasses whenever you are outside.  Heart  disease, diabetes, and high blood pressure  High blood pressure causes heart disease and increases the risk of stroke. High blood pressure is more likely to develop in: ? People who have blood pressure in the high end of the normal range (130-139/85-89 mm Hg). ? People who are overweight or obese. ? People who are African American.  If you are 28-10 years of age, have your blood pressure checked every 3-5 years. If you are 14 years of age or older, have your blood pressure checked every year. You should have your blood pressure measured twice-once when you are at a hospital or clinic, and once when you are not at a hospital or clinic. Record the average of the two measurements. To check your blood pressure when you are not at a hospital or clinic, you can use: ? An automated blood pressure machine at a pharmacy. ? A home blood pressure monitor.  If you are between 89 years and 30 years old, ask your health care provider if you  should take aspirin to prevent strokes.  Have regular diabetes screenings. This involves taking a blood sample to check your fasting blood sugar level. ? If you are at a normal weight and have a low risk for diabetes, have this test once every three years after 66 years of age. ? If you are overweight and have a high risk for diabetes, consider being tested at a younger age or more often. Preventing infection Hepatitis B  If you have a higher risk for hepatitis B, you should be screened for this virus. You are considered at high risk for hepatitis B if: ? You were born in a country where hepatitis B is common. Ask your health care provider which countries are considered high risk. ? Your parents were born in a high-risk country, and you have not been immunized against hepatitis B (hepatitis B vaccine). ? You have HIV or AIDS. ? You use needles to inject street drugs. ? You live with someone who has hepatitis B. ? You have had sex with someone who has hepatitis B. ? You get hemodialysis treatment. ? You take certain medicines for conditions, including cancer, organ transplantation, and autoimmune conditions.  Hepatitis C  Blood testing is recommended for: ? Everyone born from [birth year] through 1964. ? Anyone with known risk factors for hepatitis C.  Sexually transmitted infections (STIs)  You should be screened for sexually transmitted infections (STIs) including gonorrhea and chlamydia if: ? You are sexually active and are younger than 66 years of age. ? You are older than 66 years of age and your health care provider tells you that you are at risk for this type of infection. ? Your sexual activity has changed since you were last screened and you are at an increased risk for chlamydia or gonorrhea. Ask your health care provider if you are at risk.  If you do not have HIV, but are at risk, it may be recommended that you take a prescription medicine daily to prevent HIV infection. This is  called pre-exposure prophylaxis (PrEP). You are considered at risk if: ? You are sexually active and do not regularly use condoms or know the HIV status of your partner(s). ? You take drugs by injection. ? You are sexually active with a partner who has HIV.  Talk with your health care provider about whether you are at high risk of being infected with HIV. If you choose to begin PrEP, you should first be tested for HIV. You  should then be tested every 3 months for as long as you are taking PrEP. Pregnancy  If you are premenopausal and you may become pregnant, ask your health care provider about preconception counseling.  If you may become pregnant, take 400 to 800 micrograms (mcg) of folic acid every day.  If you want to prevent pregnancy, talk to your health care provider about birth control (contraception). Osteoporosis and menopause  Osteoporosis is a disease in which the bones lose minerals and strength with aging. This can result in serious bone fractures. Your risk for osteoporosis can be identified using a bone density scan.  If you are 43 years of age or older, or if you are at risk for osteoporosis and fractures, ask your health care provider if you should be screened.  Ask your health care provider whether you should take a calcium or vitamin D supplement to lower your risk for osteoporosis.  Menopause may have certain physical symptoms and risks.  Hormone replacement therapy may reduce some of these symptoms and risks. Talk to your health care provider about whether hormone replacement therapy is right for you. Follow these instructions at home:  Schedule regular health, dental, and eye exams.  Stay current with your immunizations.  Do not use any tobacco products including cigarettes, chewing tobacco, or electronic cigarettes.  If you are pregnant, do not drink alcohol.  If you are breastfeeding, limit how much and how often you drink alcohol.  Limit alcohol intake to  no more than 1 drink per day for nonpregnant women. One drink equals 12 ounces of beer, 5 ounces of wine, or 1 ounces of hard liquor.  Do not use street drugs.  Do not share needles.  Ask your health care provider for help if you need support or information about quitting drugs.  Tell your health care provider if you often feel depressed.  Tell your health care provider if you have ever been abused or do not feel safe at home. This information is not intended to replace advice given to you by your health care provider. Make sure you discuss any questions you have with your health care provider. Document Released: 01/05/2011 Document Revised: 11/28/2015 Document Reviewed: 03/26/2015 Elsevier Interactive Patient Education  [employer].

## 2017-11-18 ENCOUNTER — Telehealth: Payer: Self-pay

## 2017-11-18 NOTE — Telephone Encounter (Signed)
Patient dropped off updated living will and healthcare POA paperwork to be scanned to file. Placing for Dr Danise Mina to Shannon Kelly, [place]

## 2017-12-20 ENCOUNTER — Other Ambulatory Visit: Payer: Self-pay | Admitting: Family Medicine

## 2017-12-20 NOTE — Telephone Encounter (Signed)
Celebrex last filled:  08/12/17, #90 Lorazepam last filled:  08/11/17, #60 Last OV (CPE):  10/13/17 Next OV (CPE): 10/17/18

## 2017-12-22 NOTE — Telephone Encounter (Signed)
Eprescribed.

## 2018-01-19 DIAGNOSIS — D225 Melanocytic nevi of trunk: Secondary | ICD-10-CM | POA: Diagnosis not present

## 2018-01-19 DIAGNOSIS — D2272 Melanocytic nevi of left lower limb, including hip: Secondary | ICD-10-CM | POA: Diagnosis not present

## 2018-01-19 DIAGNOSIS — D2271 Melanocytic nevi of right lower limb, including hip: Secondary | ICD-10-CM | POA: Diagnosis not present

## 2018-01-19 DIAGNOSIS — L570 Actinic keratosis: Secondary | ICD-10-CM | POA: Diagnosis not present

## 2018-01-19 DIAGNOSIS — X32XXXA Exposure to sunlight, initial encounter: Secondary | ICD-10-CM | POA: Diagnosis not present

## 2018-01-19 DIAGNOSIS — D2261 Melanocytic nevi of right upper limb, including shoulder: Secondary | ICD-10-CM | POA: Diagnosis not present

## 2018-01-19 DIAGNOSIS — D2262 Melanocytic nevi of left upper limb, including shoulder: Secondary | ICD-10-CM | POA: Diagnosis not present

## 2018-01-19 DIAGNOSIS — L821 Other seborrheic keratosis: Secondary | ICD-10-CM | POA: Diagnosis not present

## 2018-04-04 ENCOUNTER — Other Ambulatory Visit: Payer: Self-pay | Admitting: Family Medicine

## 2018-04-04 NOTE — Telephone Encounter (Signed)
Electronic refill request Last office visit 10/13/17 Last refill 12/22/17 #90

## 2018-05-17 ENCOUNTER — Other Ambulatory Visit: Payer: Self-pay | Admitting: Family Medicine

## 2018-05-17 NOTE — Telephone Encounter (Signed)
Name of Medication: Lorazepam Name of Pharmacy: [facility] or Written Date and Quantity: 12/22/17, #60/0 Last Office Visit and Type: 10/13/17, CPE Next Office Visit and Type: 10/17/18, CPE Last Controlled Substance Agreement Date: 10/08/15 Last UDS: none

## 2018-05-18 NOTE — Telephone Encounter (Signed)
Eprescribed.

## 2018-06-07 ENCOUNTER — Encounter: Payer: Self-pay | Admitting: Internal Medicine

## 2018-07-07 ENCOUNTER — Ambulatory Visit (AMBULATORY_SURGERY_CENTER): Payer: Self-pay | Admitting: *Deleted

## 2018-07-07 ENCOUNTER — Other Ambulatory Visit: Payer: Self-pay

## 2018-07-07 VITALS — Ht 66.0 in | Wt 167.0 lb

## 2018-07-07 DIAGNOSIS — Z800 Family history of malignant neoplasm of digestive organs: Secondary | ICD-10-CM

## 2018-07-07 NOTE — Progress Notes (Signed)
No egg or soy allergy known to patient  No issues with past sedation with any surgeries  or procedures, no intubation problems  No diet pills per patient No home 02 use per patient  No blood thinners per patient  Pt denies issues with constipation  No A fib or A flutter  EMMI video offered and declined by the patient. 

## 2018-07-21 ENCOUNTER — Ambulatory Visit (AMBULATORY_SURGERY_CENTER): Payer: PPO | Admitting: Internal Medicine

## 2018-07-21 ENCOUNTER — Encounter: Payer: Self-pay | Admitting: Internal Medicine

## 2018-07-21 VITALS — BP 113/61 | HR 60 | Temp 98.2°F | Resp 17 | Ht 65.0 in | Wt 163.0 lb

## 2018-07-21 DIAGNOSIS — Z1211 Encounter for screening for malignant neoplasm of colon: Secondary | ICD-10-CM | POA: Diagnosis not present

## 2018-07-21 DIAGNOSIS — Z800 Family history of malignant neoplasm of digestive organs: Secondary | ICD-10-CM

## 2018-07-21 DIAGNOSIS — Z86010 Personal history of colonic polyps: Secondary | ICD-10-CM | POA: Diagnosis not present

## 2018-07-21 DIAGNOSIS — E7800 Pure hypercholesterolemia, unspecified: Secondary | ICD-10-CM | POA: Diagnosis not present

## 2018-07-21 MED ORDER — SODIUM CHLORIDE 0.9 % IV SOLN: 500.0000 mL | Freq: Once | INTRAVENOUS | Status: DC

## 2018-07-21 NOTE — Patient Instructions (Addendum)
No polyps or cancer seen,  Did see diverticulosis and hemorrhoids.  My office will make a referral to genetics counselor as we discussed.  Your next routine colonoscopy should be in 5 years - 2025  I appreciate the opportunity to care for you. Gatha Mayer, MD, FACG YOU HAD AN ENDOSCOPIC PROCEDURE TODAY AT [facility]:   Refer to the procedure report that was given to you for any specific questions about what was found during the examination.  If the procedure report does not answer your questions, please call your gastroenterologist to clarify.  If you requested that your care partner not be given the details of your procedure findings, then the procedure report has been included in a sealed envelope for you to review at your convenience later.  YOU SHOULD EXPECT: Some feelings of bloating in the abdomen. Passage of more gas than usual.  Walking can help get rid of the air that was put into your GI tract during the procedure and reduce the bloating. If you had a lower endoscopy (such as a colonoscopy or flexible sigmoidoscopy) you may notice spotting of blood in your stool or on the toilet paper. If you underwent a bowel prep for your procedure, you may not have a normal bowel movement for a few days.  Please Note:  You might notice some irritation and congestion in your nose or some drainage.  This is from the oxygen used during your procedure.  There is no need for concern and it should clear up in a day or so.  SYMPTOMS TO REPORT IMMEDIATELY:   Following lower endoscopy (colonoscopy or flexible sigmoidoscopy):  Excessive amounts of blood in the stool  Significant tenderness or worsening of abdominal pains  Swelling of the abdomen that is new, acute  Fever of 100F or higher  For urgent or emergent issues, a gastroenterologist can be reached at any hour by calling [phone].   DIET:  We do recommend a small meal at first, but then you may proceed to  your regular diet.  Drink plenty of fluids but you should avoid alcoholic beverages for 24 hours.  ACTIVITY:  You should plan to take it easy for the rest of today and you should NOT DRIVE or use heavy machinery until tomorrow (because of the sedation medicines used during the test).    FOLLOW UP: Our staff will call the number listed on your records the next business day following your procedure to check on you and address any questions or concerns that you may have regarding the information given to you following your procedure. If we do not reach you, we will leave a message.  However, if you are feeling well and you are not experiencing any problems, there is no need to return our call.  We will assume that you have returned to your regular daily activities without incident.  If any biopsies were taken you will be contacted by phone or by letter within the next 1-3 weeks.  Please call us at [phone] if you have not heard about the biopsies in 3 weeks.   Repeat next screening Colonoscopy in 2025 ( 5 years )   SIGNATURES/CONFIDENTIALITY: You and/or your care partner have signed paperwork which will be entered into your electronic medical record.  These signatures attest to the fact that that the information above on your After Visit Summary has been reviewed and is understood.  Full responsibility of the confidentiality of this discharge information lies  with you and/or your care-partner.

## 2018-07-21 NOTE — Progress Notes (Signed)
Pt's states no medical or surgical changes since previsit or office visit. 

## 2018-07-21 NOTE — Op Note (Signed)
[facility] Patient Name: Shannon Kelly Procedure Date: 07/21/2018 11:52 AM MRN: [id number] Endoscopist: Gatha Mayer , MD Age: 68 Referring MD:  Date of Birth: [birth year] Gender: Female Account #: [id number] Procedure:                Colonoscopy Indications:              Screening in patient at increased risk: Family                            history of 1st-degree relative with colorectal                            cancer Medicines:                Propofol per Anesthesia, Monitored Anesthesia Care Procedure:                Pre-Anesthesia Assessment:                           - Prior to the procedure, a History and Physical                            was performed, and patient medications and                            allergies were reviewed. The patient's tolerance of                            previous anesthesia was also reviewed. The risks                            and benefits of the procedure and the sedation                            options and risks were discussed with the patient.                            All questions were answered, and informed consent                            was obtained. Prior Anticoagulants: The patient has                            taken no previous anticoagulant or antiplatelet                            agents. ASA Grade Assessment: II - A patient with                            mild systemic disease. After reviewing the risks                            and benefits, the patient was deemed in  satisfactory condition to undergo the procedure.                           After obtaining informed consent, the colonoscope                            was passed under direct vision. Throughout the                            procedure, the patient's blood pressure, pulse, and                            oxygen saturations were monitored continuously. The                            Colonoscope was introduced  through the anus and                            advanced to the the terminal ileum, with                            identification of the appendiceal orifice and IC                            valve. The colonoscopy was performed without                            difficulty. The patient tolerated the procedure                            well. The quality of the bowel preparation was                            excellent. The terminal ileum, ileocecal valve,                            appendiceal orifice, and rectum were photographed.                            The bowel preparation used was Miralax. Scope In: 12:02:04 PM Scope Out: 12:13:19 PM Scope Withdrawal Time: 0 hours 8 minutes 22 seconds  Total Procedure Duration: 0 hours 11 minutes 15 seconds  Findings:                 The perianal and digital rectal examinations were                            normal.                           External and internal hemorrhoids were found.                           A few diverticula were found in the left colon.  The terminal ileum appeared normal.                           The exam was otherwise without abnormality on                            direct and retroflexion views. Complications:            No immediate complications. Estimated Blood Loss:     Estimated blood loss: none. Impression:               - External and internal hemorrhoids.                           - Diverticulosis in the left colon.                           - The examined portion of the ileum was normal.                           - The examination was otherwise normal on direct                            and retroflexion views.                           - No specimens collected. Recommendation:           - Patient has a contact number available for                            emergencies. The signs and symptoms of potential                            delayed complications were discussed with the                             patient. Return to normal activities tomorrow.                            Written discharge instructions were provided to the                            patient.                           - Resume previous diet.                           - Continue present medications.                           - Repeat colonoscopy in 5 years. Family hx of colon                            cancer mom                           -  REFER TO GENETICS COUNSELOR DUE TO FAMILY HISTORY                            MULTIPLE CANCERS (COLON, PANCREATIC, RENAL) Gatha Mayer, MD 07/21/2018 12:21:49 PM This report has been signed electronically.

## 2018-07-21 NOTE — Progress Notes (Signed)
Report given to PACU, vss 

## 2018-07-22 ENCOUNTER — Telehealth: Payer: Self-pay | Admitting: *Deleted

## 2018-07-22 NOTE — Telephone Encounter (Signed)
  Follow up Call-  Call back number 07/21/2018  Post procedure Call Back phone  # [phone]  Permission to leave phone message Yes  Some recent data might be hidden     Patient questions:  Do you have a fever, pain , or abdominal swelling? No. Pain Score  0 *  Have you tolerated food without any problems? Yes.    Have you been able to return to your normal activities? Yes.    Do you have any questions about your discharge instructions: Diet   No. Medications  No. Follow up visit  No.  Do you have questions or concerns about your Care? No.  Actions: * If pain score is 4 or above: No action needed, pain <4.

## 2018-07-25 ENCOUNTER — Other Ambulatory Visit: Payer: Self-pay | Admitting: Family Medicine

## 2018-07-25 NOTE — Telephone Encounter (Signed)
Electronic refill request Celebrex Last office visit 10/13/17 Last refill 04/06/18 #[phone]

## 2018-07-26 ENCOUNTER — Other Ambulatory Visit: Payer: Self-pay

## 2018-07-26 DIAGNOSIS — Z800 Family history of malignant neoplasm of digestive organs: Secondary | ICD-10-CM

## 2018-07-28 ENCOUNTER — Encounter: Payer: Self-pay | Admitting: Licensed Clinical Social Worker

## 2018-07-28 ENCOUNTER — Telehealth: Payer: Self-pay | Admitting: Licensed Clinical Social Worker

## 2018-07-28 NOTE — Telephone Encounter (Signed)
A genetic counseling appt as been scheduled for the pt to see Faith Rogue on 2/6 at 1pm. Pt has been cld and made of appt date and time. Letter mailed.

## 2018-08-11 ENCOUNTER — Inpatient Hospital Stay: Payer: PPO

## 2018-08-11 ENCOUNTER — Inpatient Hospital Stay: Payer: PPO | Attending: Genetic Counselor | Admitting: Licensed Clinical Social Worker

## 2018-08-11 ENCOUNTER — Encounter: Payer: Self-pay | Admitting: Licensed Clinical Social Worker

## 2018-08-11 DIAGNOSIS — Z800 Family history of malignant neoplasm of digestive organs: Secondary | ICD-10-CM | POA: Diagnosis not present

## 2018-08-11 DIAGNOSIS — Z8042 Family history of malignant neoplasm of prostate: Secondary | ICD-10-CM | POA: Diagnosis not present

## 2018-08-11 DIAGNOSIS — Z807 Family history of other malignant neoplasms of lymphoid, hematopoietic and related tissues: Secondary | ICD-10-CM | POA: Diagnosis not present

## 2018-08-11 DIAGNOSIS — Z8051 Family history of malignant neoplasm of kidney: Secondary | ICD-10-CM

## 2018-08-11 DIAGNOSIS — Z1379 Encounter for other screening for genetic and chromosomal anomalies: Secondary | ICD-10-CM

## 2018-08-11 DIAGNOSIS — Z803 Family history of malignant neoplasm of breast: Secondary | ICD-10-CM

## 2018-08-11 NOTE — Progress Notes (Signed)
REFERRING PROVIDER: Gatha Mayer, MD [address], [place] [address]  PRIMARY PROVIDER:  Ria Bush, MD  PRIMARY REASON FOR VISIT:  1. Family history of colon cancer   2. Family history of breast cancer   3. Family history of prostate cancer   4. Family history of stomach cancer   5. Family history of lymphoma   6. Family history of renal cell carcinoma   7. Family history of pancreatic cancer     HISTORY OF PRESENT ILLNESS:   Shannon Kelly, a 68 y.o. female, was seen for a [facility] cancer genetics consultation at the request of Dr. Carlean Purl due to a family history of cancer.  Shannon Kelly presents to clinic today to discuss the possibility of a hereditary predisposition to cancer, genetic testing, and to further clarify her future cancer risks, as well as potential cancer risks for family members.    Shannon Kelly is a 68 y.o. female with no personal history of cancer.    CANCER HISTORY:   No history exists.     HORMONAL RISK FACTORS:  Menarche was at age 83.  First live birth at age 6.  OCP use for approximately 10 years.  Ovaries intact: yes.  Hysterectomy: yes.  Menopausal status: postmenopausal.  HRT use: 0 years. Colonoscopy: yes; normal. Mammogram within the last year: yes. Number of breast biopsies: 0.  Past Medical History:  Diagnosis Date  . Anxiety   . Arthritis   . Cataract   . Depression   . Diverticula of colon    sigmoid  . Family history of breast cancer   . Family history of colon cancer   . Family history of lymphoma   . Family history of pancreatic cancer   . Family history of prostate cancer   . Family history of renal cell carcinoma   . Family history of stomach cancer   . HLD (hyperlipidemia)   . Insomnia   . 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 Microhematuria 06/2013   stable CT and [place], seemed to resolve, released from uro care 03/2014 [facility])  . Polyp, sigmoid colon    hyperplastic  . PONV (postoperative nausea and  vomiting)    OK after last cataract surgery  . Postmenopausal atrophic vaginitis    responded to vagifem  . Urine incontinence     Past Surgical History:  Procedure Laterality Date  . 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 CATARACT EXTRACTION W/ INTRAOCULAR LENS IMPLANT Right 07/12/2013   Dr. Wallace Going  . CATARACT EXTRACTION W/PHACO Left 09/23/2016   Procedure: CATARACT EXTRACTION PHACO AND INTRAOCULAR LENS PLACEMENT ([facility])  Left;  Surgeon: Leandrew Koyanagi, MD;  Location: [facility];  Service: Ophthalmology;  Laterality: Left;  . COLONOSCOPY  06/2013   microscopic colitis, rpt 5 yrs [facility] Purl)  . DEXA  05/2012   normal  . SHOULDER SURGERY Right 2005   Calcium deposit in tendon  . TUBAL LIGATION  1980  . URETHRAL SLING  08/27/2009   [facility]  . VAGINAL HYSTERECTOMY  1993   fibroids, ovaries remained ([facility])    Social History   Socioeconomic History  . Marital status: Married    Spouse name: Not on file  . Number of children: 1  . Years of education: Not on file  . Highest education level: Not on file  Occupational History  . Occupation: Retired  Scientific laboratory technician  . Financial resource strain: Not on file  . Food  insecurity:    Worry: Not on file    Inability: Not on file  . Transportation needs:    Medical: Not on file    Non-medical: Not on file  Tobacco Use  . Smoking status: Never Smoker  . Smokeless tobacco: Never Used  Substance and Sexual Activity  . Alcohol use: No    Comment: occasional  . Drug use: No  . Sexual activity: Not on file  Lifestyle  . Physical activity:    Days per week: Not on file    Minutes per session: Not on file  . Stress: Not on file  Relationships  . Social connections:    Talks on phone: Not on file    Gets together: Not on file    Attends religious service: Not on file    Active member of club or organization: Not on file    Attends meetings of clubs or organizations: Not on file     Relationship status: Not on file  Other Topics Concern  . Not on file  Social History Narrative   Married, 1 son   Had been living in [place], return to [facility] fall 2014   Occupation: Retired, prior worked at [employer]   Edu: [employer]   Activity: plays with dog, some walking    Diet: good water, fruits/vegetables daily     FAMILY HISTORY:  We obtained a detailed, 4-generation family history.  Significant diagnoses are listed below: Family History  Problem Relation Age of Onset  . Cancer Mother 93       colon, lymphoma  . Cancer Brother 71       RCC stage 4  . Cancer Brother 55       pancreatic  . Cancer Cousin 48       lung  . Cancer Cousin 21       nerve cancer in leg  . Cancer Maternal Uncle 64       stomach  . Stomach cancer Maternal Uncle   . Cancer Cousin        NHL  . Diabetes Father   . CAD Father 39       stents x2  . Cancer Father 45       pancreatic  . CAD Maternal Uncle   . Colon cancer Maternal Uncle        dx 74s  . Breast cancer Paternal Aunt 41  . Breast cancer Paternal Aunt 28  . Prostate cancer Paternal Uncle   . Stomach cancer Maternal Grandfather   . Leukemia Paternal Grandmother   . Cancer Paternal Uncle        unknown type  . Cancer Cousin        unknown type, dx in 8s  . Colon polyps Neg Hx   . Esophageal cancer Neg Hx   . Rectal cancer Neg Hx     Shannon Kelly has one son, age 22. She had two brothers and a sister. One of her brothers had pancreatic cancer, diagnosed at 55 and died at 38. Her other brother had renal cell carcinoma diagnosed at 33 and died at 76. Her sister is living at 16.  Shannon Kelly father had pancreatic cancer at 26, died at 68. Reportedly, he had negative genetic testing but the patient did not have the result with her. Shannon Kelly had 4 paternal aunts, 4 paternal uncles. One of her aunts had breast cancer diagnosed at 98 and died shortly after. Her son had a son  that had cancer in his 2s.  Another aunt had breast cancer at 108 and is living. An uncle had melanoma and prostate cancer. Another uncle had cancer, unknown type. Another uncle's son, the patient's cousin, had a brain tumor in his 20s, and this uncle's daughter's son had a brain tumor in his teens. The patient's paternal grandmother had leukemia and died in her 45s. She has no information about her paternal grandfather.  Shannon Kelly mother had colon cancer and lymphoma diagnosed at 2, and died at 57. The patient had 4 full maternal uncles and 4 half-uncles. One of her full uncles had stomach cancer and died at 63, and another had colon cancer and died in his 51s. A third full uncle had a daughter who died from a "nerve cancer in her leg" at 75, a son who died of lung cancer at 49, and another son who had both Non-hodgkin's and Hodgkin's lymphoma. The patient's maternal grandfather had stomach cancer, maternal grandmother died at 25.  Shannon Kelly is aware of previous family history of genetic testing for hereditary cancer risks. Patient's maternal ancestors are of Caucasian descent, and paternal ancestors are of Caucasian descent. There is no reported Ashkenazi Jewish ancestry. There is no known consanguinity.  GENETIC COUNSELING ASSESSMENT: Shannon Kelly is a 68 y.o. female with a family which is somewhat suggestive of a Hereditary Cancer Predisposition Syndrome, such as Hereditary Breast and Ovarian Cancer Syndrome and Lynch syndrome. We, therefore, discussed and recommended the following at today's visit.   DISCUSSION: We discussed that about 5-10% of cancer cases are hereditary. We discussed the BRCA genes as well as the Lynch syndrome genes, noting there are many other genes that we can test for that are each associated with their own cancer types.  We reviewed the characteristics, features and inheritance patterns of hereditary cancer syndromes. We also discussed genetic testing, including the appropriate family members to  test, the process of testing, insurance coverage and turn-around-time for results. We discussed the implications of a negative, positive and/or variant of uncertain significant result. We recommended Shannon Kelly pursue genetic testing for the [facility] Multi-Cancer Panel + Renal Cancer Panel + Colorectal Cancer Panel.  The Multi-Cancer Panel offered by [facility] includes sequencing and/or deletion duplication testing of the following 84 genes: AIP, ALK, APC, ATM, AXIN2,BAP1,  BARD1, BLM, BMPR1A, BRCA1, BRCA2, BRIP1, CASR, CDC73, CDH1, CDK4, CDKN1B, CDKN1C, CDKN2A (p14ARF), CDKN2A (p16INK4a), CEBPA, CHEK2, CTNNA1, DICER1, DIS3L2, EGFR (c.2369C>T, p.Thr790Met variant only), EPCAM (Deletion/duplication testing only), FH, FLCN, GATA2, GPC3, GREM1 (Promoter region deletion/duplication testing only), HOXB13 (c.251G>A, p.Gly84Glu), HRAS, KIT, MAX, MEN1, MET, MITF (c.952G>A, p.Glu318Lys variant only), MLH1, MSH2, MSH3, MSH6, MUTYH, NBN, NF1, NF2, NTHL1, PALB2, PDGFRA, PHOX2B, PMS2, POLD1, POLE, POT1, PRKAR1A, PTCH1, PTEN, RAD50, RAD51C, RAD51D, RB1, RECQL4, RET, RUNX1, SDHAF2, SDHA (sequence changes only), SDHB, SDHC, SDHD, SMAD4, SMARCA4, SMARCB1, SMARCE1, STK11, SUFU, TERC, TERT, TMEM127, TP53, TSC1, TSC2, VHL, WRN and WT1.   The [facility] Renal/Urinary Tract Cancers Panel analyzes the following 24 genes:BAP1 ,CDC73, CDKN1C, DICER1, DIS3L2, EPCAM, FH, FLCN, GPC3, MET, MLH1, MSH2, MSH6, PMS2, PTEN, SDHB, SDHC, SMARCA4, SMARCB1, TP53, TSC1, TSC2, VHL, WT1. Preliminary evidence genes: BUB1B, CEP57, MITF, PALB2, SDHA, SDHD.   The Colorectal Cancer Panel offered by [facility] includes sequencing and/or deletion duplication testing of the following 30 genes: APC, AXIN2, BMPR1A, CDH1, CHEK2, EPCAM, GREM1, MLH1, MSH2, MSH3, MSH6, MUTYH, NTHL1, PMS2, POLD1, POLE, PTEN, SMAD4, STK11, TP53, ATM, BLM, BUB1B, CEP57, ENG, FLCN, GALNT12, MLH3, RNF43, RPS20.  We discussed that  if she is found to have a mutation in one of these genes, it  may impact future medical management recommendations such as increased cancer screenings and consideration of risk reducing surgeries.  A positive result could also have implications for the patient's family members.  A Negative result would mean we were unable to identify a hereditary component to her family of cancer but does not rule out the possibility of a hereditary basis for her family of cancer.  There could be mutations that are undetectable by current technology, or in genes not yet tested or identified to increase cancer risk.    We discussed the potential to find a Variant of Uncertain Significance or VUS.  These are variants that have not yet been identified as pathogenic or benign, and it is unknown if this variant is associated with increased cancer risk or if this is a normal finding.  Most VUS's are reclassified to benign or likely benign.   It should not be used to make medical management decisions. With time, we suspect the lab will determine the significance of any VUS's identified if any.   Based on Shannon Kelly's family history of cancer, she meets NCCN medical criteria for genetic testing. Despite that she meets criteria, she may still have an out of pocket cost. The lab will notify her of an OOP if any.  PLAN: After considering the risks, benefits, and limitations, Shannon Kelly  provided informed consent to pursue genetic testing and the blood sample was sent to [facility] for analysis of the Multi-Cancer Panel + Renal Cancer Panel + Colorectal Cancer Panel. Results should be available within approximately 2-3 weeks' time, at which point they will be disclosed by telephone to Shannon Kelly, as will any additional recommendations warranted by these results. Shannon Kelly will receive a summary of her genetic counseling visit and a copy of her results once available. This information will also be available in Epic.  Based on Shannon Kellys family history, we recommended her sister and  relatives on both sides of the family (especially those still living and have history of cancer) have genetic counseling and testing. Shannon Kelly will let us know if we can be of any assistance in coordinating genetic counseling and/or testing for this family member.   Lastly, we encouraged Ms. Wisor to remain in contact with cancer genetics annually so that we can continuously update the family history and inform her of any changes in cancer genetics and testing that may be of benefit for this family.   Ms.  Zwiefelhofer questions were answered to her satisfaction today. Our contact information was provided should additional questions or concerns arise. Thank you for the referral and allowing [place] to share in the care of your patient.   Faith Rogue, MS Genetic Counselor [email or web address] Phone: [phone]  The patient was seen for a total of 50 minutes in face-to-face genetic counseling.

## 2018-08-12 ENCOUNTER — Other Ambulatory Visit: Payer: Self-pay | Admitting: Family Medicine

## 2018-08-12 DIAGNOSIS — Z1231 Encounter for screening mammogram for malignant neoplasm of breast: Secondary | ICD-10-CM

## 2018-08-24 ENCOUNTER — Telehealth: Payer: Self-pay | Admitting: Licensed Clinical Social Worker

## 2018-08-25 ENCOUNTER — Encounter: Payer: Self-pay | Admitting: Licensed Clinical Social Worker

## 2018-08-25 ENCOUNTER — Ambulatory Visit: Payer: Self-pay | Admitting: Licensed Clinical Social Worker

## 2018-08-25 DIAGNOSIS — Z1379 Encounter for other screening for genetic and chromosomal anomalies: Secondary | ICD-10-CM

## 2018-08-25 NOTE — Progress Notes (Signed)
HPI:  Ms. Jividen was previously seen in the [facility] [place] clinic on 08/11/2018 due to a family history of cancer and concerns regarding a hereditary predisposition to cancer. Please refer to our prior cancer genetics clinic note for more information regarding Ms. Funes's medical, social and family histories, and our assessment and recommendations, at the time. Ms. Imel recent genetic test results were disclosed to her, as well as recommendations warranted by these results. These results and recommendations are discussed in more detail below.  CANCER HISTORY:   No history exists.     FAMILY HISTORY:  We obtained a detailed, 4-generation family history.  Significant diagnoses are listed below: Family History  Problem Relation Age of Onset  . Cancer Mother 29       colon, lymphoma  . Cancer Brother 23       RCC stage 4  . Cancer Brother 70       pancreatic  . Cancer Cousin 48       lung  . Cancer Cousin 21       nerve cancer in leg  . Cancer Maternal Uncle 64       stomach  . Stomach cancer Maternal Uncle   . Cancer Cousin        NHL  . Diabetes Father   . CAD Father 20       stents x2  . Cancer Father 17       pancreatic  . CAD Maternal Uncle   . Colon cancer Maternal Uncle        dx 64s  . Breast cancer Paternal Aunt 45  . Breast cancer Paternal Aunt 70  . Prostate cancer Paternal Uncle   . Stomach cancer Maternal Grandfather   . Leukemia Paternal Grandmother   . Cancer Paternal Uncle        unknown type  . Cancer Cousin        unknown type, dx in 47s  . Colon polyps Neg Hx   . Esophageal cancer Neg Hx   . Rectal cancer Neg Hx    Ms. Transue has one son, age 32. She had two brothers and a sister. One of her brothers had pancreatic cancer, diagnosed at 89 and died at 66. Her other brother had renal cell carcinoma diagnosed at 45 and died at 62. Her sister is living at 61.  Ms. Hoch father had pancreatic cancer at 8, died at 60. Reportedly, he had  negative genetic testing but the patient did not have the result with her. Ms. Wallner had 4 paternal aunts, 4 paternal uncles. One of her aunts had breast cancer diagnosed at 43 and died shortly after. Her son had a son that had cancer in his 48s. Another aunt had breast cancer at 22 and is living. An uncle had melanoma and prostate cancer. Another uncle had cancer, unknown type. Another uncle's son, the patient's cousin, had a brain tumor in his 71s, and this uncle's daughter's son had a brain tumor in his teens. The patient's paternal grandmother had leukemia and died in her 49s. She has no information about her paternal grandfather.  Ms. Decatur mother had colon cancer and lymphoma diagnosed at 41, and died at 43. The patient had 4 full maternal uncles and 4 half-uncles. One of her full uncles had stomach cancer and died at 79, and another had colon cancer and died in his 58s. A third full uncle had a daughter who died from a "nerve cancer in her  leg" at 7, a son who died of lung cancer at 40, and another son who had both Non-hodgkin's and Hodgkin's lymphoma. The patient's maternal grandfather had stomach cancer, maternal grandmother died at 50.  Ms. Currier is aware of previous family history of genetic testing for hereditary cancer risks. Patient's maternal ancestors are of Caucasian descent, and paternal ancestors are of Caucasian descent. There is no reported Ashkenazi Jewish ancestry. There is no known consanguinity.  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   The test report will be scanned into EPIC and will be located under the Molecular Pathology section of the Results Review tab. A portion of the result report is included below for reference.     We discussed  with Ms. Rafter that because current genetic testing is not perfect, it is possible there may be a gene mutation in one of these genes that current testing cannot detect, but that chance is small.  We also discussed, that there could be another gene that has not yet been discovered, or that we have not yet tested, that is responsible for the cancer diagnoses in the family. It is also possible there is a hereditary cause for the cancer in the family that Ms. Raska did not inherit and therefore was not identified in her testing.  Therefore, it is important to remain in touch with cancer genetics in the future so that we can continue to offer Ms. Plude the most up to date genetic testing.   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 It should not be used to make medical management decisions. With time, we suspect the lab will determine the significance of this variant, if any. If we do learn more about it, we will try to contact Ms. Rodrigues to discuss it further. However, it is important to stay in touch with [place] periodically and keep the address and phone number up to date.  ADDITIONAL GENETIC TESTING: We discussed with Ms. Hassell that her genetic testing was fairly extensive.  If there are are genes identified to increase cancer risk that can be analyzed in the future, we would be happy to discuss and coordinate this testing at that time.    CANCER SCREENING RECOMMENDATIONS: Ms. Rud test result is considered negative (normal).  This means that we have not identified a hereditary cause for her family history of cancer at this time.   This normal indicates that it is unlikely Ms. Pilling has an increased risk of cancer due to a mutation in one of these genes. While reassuring, this does not definitively rule out a hereditary  predisposition to cancer. It is still possible that there could be genetic mutations that are undetectable by current technology, or genetic mutations in genes that have not been tested or identified to increase cancer risk.  Therefore, it is recommended she continue to follow the cancer management and screening guidelines provided by her oncology and primary healthcare provider. An individual's cancer risk is not determined by genetic test results alone.  Overall cancer risk assessment includes additional factors such as personal medical history, family history, etc.  These  should be used to make a personalized plan for cancer prevention and surveillance.    Due to the family history of pancreatic cancer she could also consider pancreatic cancer screening.  There are no specific recommendations regarding pancreatic cancer screening and there is not yet enough data to prove efficacy at detecting all pancreatic cancers early.  However, there are specialists who could discuss these options with her if she is interested.  Some techniques include abdominal MRI and endoscopic ultrasound with biopsy of suspicious masses.   Based on the Ms. Monaco's family history of breast cancer and her test results, the risk model Harriett Rush was used to estimate her lifetime risk of breast cancer. This estimates her lifetime breast cancer risk to be 5.8%. The patient's lifetime breast cancer risk is a preliminary estimate based on available information using one of several models endorsed by the [place] (ACS). The ACS recommends consideration of breast MRI screening as an adjunct to mammography for patients at high risk (defined as 20% or greater lifetime risk). A more detailed breast cancer risk assessment can be considered, if clinically indicated.      RECOMMENDATIONS FOR FAMILY MEMBERS:  Relatives in this family might be at some increased risk of developing cancer, over the general population risk, simply  due to the family history of cancer.  We recommended women in this family have a yearly mammogram beginning at age 20, or 34 years younger than the earliest onset of cancer, an annual clinical breast exam, and perform monthly breast self-exams. Women in this family should also have a gynecological exam as recommended by their primary provider. All family members should have a colonoscopy as directed by their doctors.  All family members should inform their physicians about the family history of cancer so their doctors can make the most appropriate screening recommendations for them.   It is also possible there is a hereditary cause for the cancer in Ms. Kuna's family that she did not inherit and therefore was not identified in her.  We recommended relatives on both sides of her family have genetic counseling and testing. Ms. Biddinger will let us know if we can be of any assistance in coordinating genetic counseling and/or testing for these family members.   FOLLOW-UP: Lastly, we discussed with Ms. Villacres that cancer genetics is a rapidly advancing field and it is possible that new genetic tests will be appropriate for her and/or her family members in the future. We encouraged her to remain in contact with cancer genetics on an annual basis so we can update her personal and family histories and let her know of advances in cancer genetics that may benefit this family.   Our contact number was provided. Ms. Yerby questions were answered to her satisfaction, and she knows she is welcome to call us at anytime with additional questions or concerns.  Faith Rogue, MS Genetic Counselor [email or web address] Phone: [phone]

## 2018-08-25 NOTE — Telephone Encounter (Signed)
Revealed negative genetic testing.  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  We discussed that we do not know why there is cancer in the family. It could be due to a different gene that we are not testing, or something our current technology cannot pick up.  It will be important for her to keep in contact with genetics to learn if additional testing may be needed in the future. We also recommended her other family members on both sides have genetic counseling and testing.

## 2018-09-04 HISTORY — PX: COLONOSCOPY: SHX174

## 2018-09-15 ENCOUNTER — Ambulatory Visit
Admission: RE | Admit: 2018-09-15 | Discharge: 2018-09-15 | Disposition: A | Discharge status: Home / Self Care | Payer: PPO | Source: Ambulatory Visit | Attending: Family Medicine | Admitting: Family Medicine

## 2018-09-15 ENCOUNTER — Other Ambulatory Visit: Payer: Self-pay

## 2018-09-15 DIAGNOSIS — Z1231 Encounter for screening mammogram for malignant neoplasm of breast: Secondary | ICD-10-CM

## 2018-09-16 LAB — HM MAMMOGRAPHY

## 2018-09-19 ENCOUNTER — Encounter: Payer: Self-pay | Admitting: Family Medicine

## 2018-09-24 ENCOUNTER — Encounter: Payer: Self-pay | Admitting: Family Medicine

## 2018-09-30 ENCOUNTER — Other Ambulatory Visit: Payer: Self-pay | Admitting: Family Medicine

## 2018-09-30 NOTE — Telephone Encounter (Signed)
Name of Medication: Lorazepam Name of Pharmacy: [facility] or Written Date and Quantity: 05/19/18, #60 Last Office Visit and Type: 10/13/17, AWV Next Office Visit and Type: 10/17/18, AWV Last Controlled Substance Agreement Date: 10/08/15 Last UDS: 10/08/15

## 2018-10-02 NOTE — Telephone Encounter (Signed)
E prescsribed

## 2018-10-04 ENCOUNTER — Telehealth: Payer: Self-pay | Admitting: Family Medicine

## 2018-10-04 NOTE — Telephone Encounter (Signed)
Left message asking pt to call office please r/s appointments

## 2018-10-12 ENCOUNTER — Other Ambulatory Visit: Payer: Self-pay

## 2018-10-17 ENCOUNTER — Encounter: Payer: Self-pay | Admitting: Family Medicine

## 2018-10-31 ENCOUNTER — Other Ambulatory Visit: Payer: Self-pay | Admitting: Family Medicine

## 2018-10-31 NOTE — Telephone Encounter (Signed)
Celebrex Last filled:  07/27/18, #90 Last OV:  10/13/17, AWV Next OV:  01/13/19, AWV

## 2019-01-09 ENCOUNTER — Telehealth: Payer: Self-pay

## 2019-01-09 ENCOUNTER — Other Ambulatory Visit: Payer: Self-pay | Admitting: Family Medicine

## 2019-01-09 DIAGNOSIS — E785 Hyperlipidemia, unspecified: Secondary | ICD-10-CM

## 2019-01-09 NOTE — Telephone Encounter (Signed)
Left message to call clinic, needs COVID screen and back door lab info   

## 2019-01-10 ENCOUNTER — Other Ambulatory Visit: Payer: Self-pay

## 2019-01-10 ENCOUNTER — Other Ambulatory Visit (INDEPENDENT_AMBULATORY_CARE_PROVIDER_SITE_OTHER): Payer: PPO

## 2019-01-10 DIAGNOSIS — E785 Hyperlipidemia, unspecified: Secondary | ICD-10-CM | POA: Diagnosis not present

## 2019-01-10 LAB — LIPID PANEL
Cholesterol: 183 mg/dL (ref 0–200)
HDL: 50.5 mg/dL (ref >39.00)
LDL Cholesterol: 102 mg/dL — ABNORMAL HIGH (ref 0–99)
NonHDL: 132.21
Total CHOL/HDL Ratio: 4
Triglycerides: 150 mg/dL — ABNORMAL HIGH (ref 0.0–149.0)
VLDL: 30 mg/dL (ref 0.0–40.0)

## 2019-01-10 LAB — COMPREHENSIVE METABOLIC PANEL
ALT: 22 U/L (ref 0–35)
AST: 21 U/L (ref 0–37)
Albumin: 4.2 g/dL (ref 3.5–5.2)
Alkaline Phosphatase: 59 U/L (ref 39–117)
BUN: 16 mg/dL (ref 6–23)
CO2: 29 mEq/L (ref 19–32)
Calcium: 9 mg/dL (ref 8.4–10.5)
Chloride: 105 mEq/L (ref 96–112)
Creatinine, Ser: 1 mg/dL (ref 0.40–1.20)
GFR: 55.25 mL/min — ABNORMAL LOW (ref >60.00)
Glucose, Bld: 84 mg/dL (ref 70–99)
Potassium: 4.5 mEq/L (ref 3.5–5.1)
Sodium: 141 mEq/L (ref 135–145)
Total Bilirubin: 0.5 mg/dL (ref 0.2–1.2)
Total Protein: 6.8 g/dL (ref 6.0–8.3)

## 2019-01-10 LAB — TSH: TSH: 2.15 u[IU]/mL (ref 0.35–4.50)

## 2019-01-11 ENCOUNTER — Other Ambulatory Visit: Payer: Self-pay | Admitting: Family Medicine

## 2019-01-11 NOTE — Telephone Encounter (Signed)
Patient is requesting a refill on ATIVAN 1mg . Last refill 10/02/18. Last OV 10/13/17, has appt scheduled in July.

## 2019-01-13 ENCOUNTER — Ambulatory Visit (INDEPENDENT_AMBULATORY_CARE_PROVIDER_SITE_OTHER): Payer: PPO | Admitting: Family Medicine

## 2019-01-13 ENCOUNTER — Other Ambulatory Visit: Payer: Self-pay

## 2019-01-13 ENCOUNTER — Encounter: Payer: Self-pay | Admitting: Family Medicine

## 2019-01-13 ENCOUNTER — Ambulatory Visit (INDEPENDENT_AMBULATORY_CARE_PROVIDER_SITE_OTHER)
Admission: RE | Admit: 2019-01-13 | Discharge: 2019-01-13 | Disposition: A | Discharge status: Home / Self Care | Payer: PPO | Source: Ambulatory Visit | Attending: Family Medicine | Admitting: Family Medicine

## 2019-01-13 VITALS — BP 122/70 | HR 75 | Temp 97.8°F | Ht 65.25 in | Wt 159.0 lb

## 2019-01-13 DIAGNOSIS — N183 Chronic kidney disease, stage 3 unspecified: Secondary | ICD-10-CM | POA: Insufficient documentation

## 2019-01-13 DIAGNOSIS — M25512 Pain in left shoulder: Secondary | ICD-10-CM | POA: Diagnosis not present

## 2019-01-13 DIAGNOSIS — M159 Polyosteoarthritis, unspecified: Secondary | ICD-10-CM

## 2019-01-13 DIAGNOSIS — F5104 Psychophysiologic insomnia: Secondary | ICD-10-CM

## 2019-01-13 DIAGNOSIS — Z809 Family history of malignant neoplasm, unspecified: Secondary | ICD-10-CM

## 2019-01-13 DIAGNOSIS — Z0001 Encounter for general adult medical examination with abnormal findings: Secondary | ICD-10-CM | POA: Diagnosis not present

## 2019-01-13 DIAGNOSIS — M150 Primary generalized (osteo)arthritis: Secondary | ICD-10-CM

## 2019-01-13 DIAGNOSIS — M19012 Primary osteoarthritis, left shoulder: Secondary | ICD-10-CM | POA: Diagnosis not present

## 2019-01-13 DIAGNOSIS — F418 Other specified anxiety disorders: Secondary | ICD-10-CM

## 2019-01-13 DIAGNOSIS — N289 Disorder of kidney and ureter, unspecified: Secondary | ICD-10-CM | POA: Diagnosis not present

## 2019-01-13 DIAGNOSIS — Z0000 Encounter for general adult medical examination without abnormal findings: Secondary | ICD-10-CM

## 2019-01-13 DIAGNOSIS — E785 Hyperlipidemia, unspecified: Secondary | ICD-10-CM

## 2019-01-13 DIAGNOSIS — M8949 Other hypertrophic osteoarthropathy, multiple sites: Secondary | ICD-10-CM

## 2019-01-13 DIAGNOSIS — Z7189 Other specified counseling: Secondary | ICD-10-CM

## 2019-01-13 MED ORDER — CELECOXIB 100 MG PO CAPS: 100.0000 mg | ORAL_CAPSULE | Freq: Every day | ORAL | 3 refills | Status: DC

## 2019-01-13 NOTE — Assessment & Plan Note (Signed)
Preventative protocols reviewed and updated unless pt declined. Discussed healthy diet and lifestyle.  

## 2019-01-13 NOTE — Progress Notes (Addendum)
This visit was conducted in person.  BP 122/70 (BP Location: Left Arm, Patient Position: Sitting, Cuff Size: Normal)   Pulse 75   Temp 97.8 F (36.6 C) (Temporal)   Ht 5' 5.25" (1.657 m)   Wt 159 lb (72.1 kg)   SpO2 97%   BMI 26.26 kg/m    CC: AMW Subjective:    Patient ID: Shannon Kelly, female    DOB: 1951/07/05, 68 y.o.   MRN: [id number]  HPI: Shannon Kelly is a 68 y.o. female presenting on 01/13/2019 for Medicare Wellness   Did not see Katha Cabal this year. She has been traveling some - to [place] to visit ill friend last month.  Takes celebrex 200mg  daily for diffuse osteoarthritis, also takes ibuprofen PM.    Hearing Screening   125Hz  250Hz  500Hz  1000Hz  2000Hz  3000Hz  4000Hz  6000Hz  8000Hz   Right ear:   25 25 20   40    Left ear:   25 25 20   40      Visual Acuity Screening   Right eye Left eye Both eyes  Without correction:     With correction: 20/20 20/20 20/15   Passes depression and fall risk screen  Significant family history of cancer. Had negative genetic screening.   4 month h/o L shoulder pain worse with lifting arm above shoulder.   Preventative: COLONOSCOPY 09/2018 - diverticulosis, rpt 5 yrs due to fmhx Carlean Purl) Well woman - with OBGYN prior in [place](Dr. Wyvonne Lenz).  S/pvaginalhysterectomy1993 for fibroids, ovaries remain.Pelvic exam WNL 2017. MammogramWNL 09/2018  DEXA 2013 - WNL. Good milk intake, leafy greens, regular weight bearing exercises.  Flu shot yearly  Prevnar 2018, pneumovax today  Tdap - 08/2013  zostavax - 05/2012 shingrix - discussed, declines Advanced directives: received and scanned into chart. Husband Jaquelyn Bitter then son Gaspar Bidding are HCPOA. Does not want life prolonging measures if terminal condition.  Seat belt use discussed.  Sunscreen use discussed. No changing moles on skin.Sees dermatologist.  Non smoker Alcohol - rare Dentist - q4 mo Eye exam Q1-2 yrs Bowel - no constipation Bladder - no incontinence.  Lives with  husband, married, 1 son Had been living in [place], return to [facility] fall 2014 Occupation: Retired, prior worked at [employer]: [employer] Activity: plays with dog, outdoor walking, joined gym through [employer] Diet: good water, fruits/vegetables daily     Relevant past medical, surgical, family and social history reviewed and updated as indicated. Interim medical history since our last visit reviewed. Allergies and medications reviewed and updated. Outpatient Medications Prior to Visit  Medication Sig Dispense Refill  . Biotin 5000 MCG CAPS Take 1 capsule by mouth daily.    . Ibuprofen-diphenhydrAMINE Cit (ADVIL PM PO) Take by mouth.    Marland Kitchen LORazepam (ATIVAN) 1 MG tablet TAKE 1 TABLET BY MOUTH TWICE A DAY AS NEEDED 60 tablet 0  . celecoxib (CELEBREX) 200 MG capsule TAKE 1 CAPSULE BY MOUTH ONCE DAILY 90 capsule 0  . cyanocobalamin 500 MCG tablet Take 1 tablet (500 mcg total) by mouth daily. (Patient taking differently: Take 500 mcg by mouth 3 (three) times a week. Takes 2-3 times a week)    . Vitamin Mixture (VITAMIN E COMPLETE PO) Take by mouth.     No facility-administered medications prior to visit.      Per HPI unless specifically indicated in ROS section below Review of Systems  Constitutional: Negative for activity change, appetite change, chills, fatigue, fever and unexpected weight change.  HENT: Negative for hearing  loss.   Eyes: Negative for visual disturbance (blurred on occasion).  Respiratory: Negative for cough, chest tightness, shortness of breath and wheezing.   Cardiovascular: Negative for chest pain, palpitations and leg swelling.  Gastrointestinal: Negative for abdominal distention, abdominal pain, blood in stool, constipation, diarrhea, nausea and vomiting.  Genitourinary: Negative for difficulty urinating and hematuria.  Musculoskeletal: Positive for arthralgias. Negative for myalgias and neck pain.  Skin: Negative for rash.   Neurological: Negative for dizziness, seizures, syncope and headaches.  Hematological: Negative for adenopathy. Bruises/bleeds easily.  Psychiatric/Behavioral: Negative for dysphoric mood. The patient is not nervous/anxious.    Objective:    BP 122/70 (BP Location: Left Arm, Patient Position: Sitting, Cuff Size: Normal)   Pulse 75   Temp 97.8 F (36.6 C) (Temporal)   Ht 5' 5.25" (1.657 m)   Wt 159 lb (72.1 kg)   SpO2 97%   BMI 26.26 kg/m   Wt Readings from Last 3 Encounters:  01/13/19 159 lb (72.1 kg)  07/21/18 163 lb (73.9 kg)  07/07/18 167 lb (75.8 kg)    Physical Exam Vitals signs and nursing note reviewed.  Constitutional:      General: She is not in acute distress.    Appearance: Normal appearance. She is well-developed. She is not ill-appearing.  HENT:     Head: Normocephalic and atraumatic.     Right Ear: Hearing, tympanic membrane, ear canal and external ear normal.     Left Ear: Hearing, tympanic membrane, ear canal and external ear normal.     Nose: Nose normal.     Mouth/Throat:     Mouth: Mucous membranes are moist.     Pharynx: Uvula midline. No oropharyngeal exudate or posterior oropharyngeal erythema.  Eyes:     General: No scleral icterus.    Extraocular Movements: Extraocular movements intact.     Conjunctiva/sclera: Conjunctivae normal.     Pupils: Pupils are equal, round, and reactive to light.  Neck:     Musculoskeletal: Normal range of motion and neck supple.  Cardiovascular:     Rate and Rhythm: Normal rate and regular rhythm.     Pulses: Normal pulses.          Radial pulses are 2+ on the right side and 2+ on the left side.     Heart sounds: Normal heart sounds. No murmur.  Pulmonary:     Effort: Pulmonary effort is normal. No respiratory distress.     Breath sounds: Normal breath sounds. No wheezing, rhonchi or rales.  Abdominal:     General: Abdomen is flat. Bowel sounds are normal. There is no distension.     Palpations: Abdomen is soft.  There is no mass.     Tenderness: There is no abdominal tenderness. There is no guarding or rebound.  Musculoskeletal: Normal range of motion.        General: Tenderness present. No swelling or deformity.     Comments:  R shoulder WNL L Shoulder exam: No deformity of shoulders on inspection. Tender to palpation lateral upper arm into shoulder  FROM in abduction and forward flexion. No significant pain or weakness with testing SITS in ext/int rotation. No pain with empty can sign. Neg Speed test. Discomfort with rotation of humeral head in [place] joint.  Neg drop arm sign  Lymphadenopathy:     Cervical: No cervical adenopathy.  Skin:    General: Skin is warm and dry.     Findings: No rash.  Neurological:     General: No  focal deficit present.     Mental Status: She is alert and oriented to person, place, and time.     Comments:  CN grossly intact, station and gait intact Recall 3/3  Calculation 5/5 serial 3s  Psychiatric:        Mood and Affect: Mood normal.        Behavior: Behavior normal.        Thought Content: Thought content normal.        Judgment: Judgment normal.       Results for orders placed or performed in visit on 01/13/19  Microalbumin / creatinine urine ratio  Result Value Ref Range   Creatinine, Urine 91 20 - 275 mg/dL   Microalb, Ur 0.3 mg/dL   Microalb Creat Ratio 3 <30 mcg/mg creat  Extra Urine Specimen  Result Value Ref Range   Extra Urine Specimen       Office Visit from 01/13/2019 in [place] at [facility]  PHQ-2 Total Score  0      DG Shoulder Left CLINICAL DATA:  Chronic left shoulder pain.  EXAM: LEFT SHOULDER - 2+ VIEW  COMPARISON:  None.  FINDINGS: There is no evidence of fracture or dislocation. Moderate degenerative changes seen involving the left acromioclavicular joint. Soft tissues are unremarkable.  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 No acute abnormality seen in  the left shoulder or visualized portion of proximal left humerus.  Electronically Signed   By: Marijo Conception M.D.   On: 01/13/2019 16:49   Assessment & Plan:   Problem List Items Addressed This Visit    Renal insufficiency    Anticipate due to NSAID use. Discussed celebrex and ibuprofen use. Pt agrees to decrease celebrex dose to 100mg , change advil PM to tylenol PM at night, and monitor kidneys more closely. Check Umicroalb today.       Relevant Orders   Microalbumin / creatinine urine ratio (Completed)   Osteoarthritis    Chronic. Will decrease celebrex to 100mg  daily. Discussed NSAID use - see below (renal insufficiency)      Relevant Medications   celecoxib (CELEBREX) 100 MG capsule   Medicare annual wellness visit, subsequent - Primary    I have personally reviewed the Medicare Annual Wellness questionnaire and have noted 1. The patient's medical and social history 2. Their use of alcohol, tobacco or illicit drugs 3. Their current medications and supplements 4. The patient's functional ability including ADL's, fall risks, home safety risks and hearing or visual impairment. Cognitive function has been assessed and addressed as indicated.  5. Diet and physical activity 6. Evidence for depression or mood disorders The patients weight, height, BMI have been recorded in the chart. I have made referrals, counseling and provided education to the patient based on review of the above and I have provided the pt with a written personalized care plan for preventive services. Provider list updated.. See scanned questionairre as needed for further documentation. Reviewed preventative protocols and updated unless pt declined.       HLD (hyperlipidemia)    Chronic, stable off medication.  The 10-year ASCVD risk score Mikey Bussing DC Brooke Bonito., et al., 2013) is: 6.2%   Values used to calculate the score:     Age: 43 years     Sex: Female     Is Non-Hispanic African American: No     Diabetic: No      Tobacco smoker: No     Systolic Blood Pressure: 161 mmHg  Is BP treated: No     HDL Cholesterol: 50.5 mg/dL     Total Cholesterol: 183 mg/dL       Family history of cancer   Encounter for routine adult health examination with abnormal findings    Preventative protocols reviewed and updated unless pt declined. Discussed healthy diet and lifestyle.       Depression with anxiety    Lorazepam refilled. May need updated UDS.       Chronic insomnia    Managed with tylenol PM, infrequent lorazepam use.       Advanced care planning/counseling discussion    Advanced directives: received and scanned into chart. Husband Jaquelyn Bitter then son Gaspar Bidding are HCPOA. Does not want life prolonging measures if terminal condition.       Acute pain of left shoulder    Unclear cause. Initially thought RTC or biceps tendonitis but exam not consistent with this. ?labral tear vs partial RTC tear. Given duration, update shoulder xrays. Strong h/o osteoarthritis.       Relevant Orders   DG Shoulder Left (Completed)       Meds ordered this encounter  Medications  . DISCONTD: celecoxib (CELEBREX) 100 MG capsule    Sig: Take 1 capsule (100 mg total) by mouth daily.    Dispense:  90 capsule    Refill:  3  . celecoxib (CELEBREX) 100 MG capsule    Sig: Take 1 capsule (100 mg total) by mouth daily.    Dispense:  90 capsule    Refill:  3   Orders Placed This Encounter  Procedures  . DG Shoulder Left    Standing Status:   Future    Number of Occurrences:   1    Standing Expiration Date:   03/15/2020    Order Specific Question:   Reason for Exam (SYMPTOM  OR DIAGNOSIS REQUIRED)    Answer:   left shoulder pain for 4 months    Order Specific Question:   Preferred imaging location?    Answer:   [facility]    Order Specific Question:   Radiology Contrast Protocol - do NOT remove file path    Answer:   [email or web address]  . Microalbumin / creatinine  urine ratio  . Extra Urine Specimen    Follow up plan: Return in about 1 year (around 01/13/2020) for annual exam, prior fasting for blood work, medicare wellness visit.  Ria Bush, MD

## 2019-01-13 NOTE — Assessment & Plan Note (Signed)
Lorazepam refilled. May need updated UDS.

## 2019-01-13 NOTE — Assessment & Plan Note (Signed)
Anticipate due to NSAID use. Discussed celebrex and ibuprofen use. Pt agrees to decrease celebrex dose to 100mg , change advil PM to tylenol PM at night, and monitor kidneys more closely. Check Umicroalb today.

## 2019-01-13 NOTE — Assessment & Plan Note (Signed)

## 2019-01-13 NOTE — Assessment & Plan Note (Signed)
Advanced directives: received and scanned into chart. Husband Jaquelyn Bitter then son Gaspar Bidding are HCPOA. Does not want life prolonging measures if terminal condition.

## 2019-01-13 NOTE — Telephone Encounter (Signed)
Eprescribed.

## 2019-01-13 NOTE — Assessment & Plan Note (Signed)
Managed with tylenol PM, infrequent lorazepam use.

## 2019-01-13 NOTE — Assessment & Plan Note (Signed)
Chronic. Will decrease celebrex to 100mg  daily. Discussed NSAID use - see below (renal insufficiency)

## 2019-01-13 NOTE — Assessment & Plan Note (Addendum)
Unclear cause. Initially thought RTC or biceps tendonitis but exam not consistent with this. ?labral tear vs partial RTC tear. Given duration, update shoulder xrays. Strong h/o osteoarthritis.

## 2019-01-13 NOTE — Patient Instructions (Addendum)
Left shoulder xray today.  Good to see you today, call us with questions. Return as needed or in 1 year for next physical.  Health Maintenance After Age 67 After age 60, you are at a higher risk for certain long-term diseases and infections as well as injuries from falls. Falls are a major cause of broken bones and head injuries in people who are older than age 74. Getting regular preventive care can help to keep you healthy and well. Preventive care includes getting regular testing and making lifestyle changes as recommended by your health care provider. Talk with your health care provider about:  Which screenings and tests you should have. A screening is a test that checks for a disease when you have no symptoms.  A diet and exercise plan that is right for you. What should I know about screenings and tests to prevent falls? Screening and testing are the best ways to find a health problem early. Early diagnosis and treatment give you the best chance of managing medical conditions that are common after age 35. Certain conditions and lifestyle choices may make you more likely to have a fall. Your health care provider may recommend:  Regular vision checks. Poor vision and conditions such as cataracts can make you more likely to have a fall. If you wear glasses, make sure to get your prescription updated if your vision changes.  Medicine review. Work with your health care provider to regularly review all of the medicines you are taking, including over-the-counter medicines. Ask your health care provider about any side effects that may make you more likely to have a fall. Tell your health care provider if any medicines that you take make you feel dizzy or sleepy.  Osteoporosis screening. Osteoporosis is a condition that causes the bones to get weaker. This can make the bones weak and cause them to break more easily.  Blood pressure screening. Blood pressure changes and medicines to control blood  pressure can make you feel dizzy.  Strength and balance checks. Your health care provider may recommend certain tests to check your strength and balance while standing, walking, or changing positions.  Foot health exam. Foot pain and numbness, as well as not wearing proper footwear, can make you more likely to have a fall.  Depression screening. You may be more likely to have a fall if you have a fear of falling, feel emotionally low, or feel unable to do activities that you used to do.  Alcohol use screening. Using too much alcohol can affect your balance and may make you more likely to have a fall. What actions can I take to lower my risk of falls? General instructions  Talk with your health care provider about your risks for falling. Tell your health care provider if: ? You fall. Be sure to tell your health care provider about all falls, even ones that seem minor. ? You feel dizzy, sleepy, or off-balance.  Take over-the-counter and prescription medicines only as told by your health care provider. These include any supplements.  Eat a healthy diet and maintain a healthy weight. A healthy diet includes low-fat dairy products, low-fat (lean) meats, and fiber from whole grains, beans, and lots of fruits and vegetables. Home safety  Remove any tripping hazards, such as rugs, cords, and clutter.  Install safety equipment such as grab bars in bathrooms and safety rails on stairs.  Keep rooms and walkways well-lit. Activity   Follow a regular exercise program to stay fit. This will  help you maintain your balance. Ask your health care provider what types of exercise are appropriate for you.  If you need a cane or walker, use it as recommended by your health care provider.  Wear supportive shoes that have nonskid soles. Lifestyle  Do not drink alcohol if your health care provider tells you not to drink.  If you drink alcohol, limit how much you have: ? 0-1 drink a day for  women. ? 0-2 drinks a day for men.  Be aware of how much alcohol is in your drink. In the [place] one drink equals one typical bottle of beer (12 oz), one-half glass of wine (5 oz), or one shot of hard liquor (1 oz).  Do not use any products that contain nicotine or tobacco, such as cigarettes and e-cigarettes. If you need help quitting, ask your health care provider. Summary  Having a healthy lifestyle and getting preventive care can help to protect your health and wellness after age 40.  Screening and testing are the best way to find a health problem early and help you avoid having a fall. Early diagnosis and treatment give you the best chance for managing medical conditions that are more common for people who are older than age 52.  Falls are a major cause of broken bones and head injuries in people who are older than age 75. Take precautions to prevent a fall at home.  Work with your health care provider to learn what changes you can make to improve your health and wellness and to prevent falls. This information is not intended to replace advice given to you by your health care provider. Make sure you discuss any questions you have with your health care provider. Document Released: 05/05/2017 Document Revised: 10/13/2018 Document Reviewed: 05/05/2017 Elsevier Patient Education  2020 [employer].

## 2019-01-13 NOTE — Assessment & Plan Note (Signed)
Chronic, stable off medication.  The 10-year ASCVD risk score Mikey Bussing DC Brooke Bonito., et al., 2013) is: 6.2%   Values used to calculate the score:     Age: 68 years     Sex: Female     Is Non-Hispanic African American: No     Diabetic: No     Tobacco smoker: No     Systolic Blood Pressure: 629 mmHg     Is BP treated: No     HDL Cholesterol: 50.5 mg/dL     Total Cholesterol: 183 mg/dL

## 2019-01-14 LAB — EXTRA URINE SPECIMEN

## 2019-01-14 LAB — MICROALBUMIN / CREATININE URINE RATIO
Creatinine, Urine: 91 mg/dL (ref 20–275)
Microalb Creat Ratio: 3 mcg/mg creat (ref <30)
Microalb, Ur: 0.3 mg/dL

## 2019-01-18 DIAGNOSIS — D2261 Melanocytic nevi of right upper limb, including shoulder: Secondary | ICD-10-CM | POA: Diagnosis not present

## 2019-01-18 DIAGNOSIS — L570 Actinic keratosis: Secondary | ICD-10-CM | POA: Diagnosis not present

## 2019-01-18 DIAGNOSIS — L821 Other seborrheic keratosis: Secondary | ICD-10-CM | POA: Diagnosis not present

## 2019-01-18 DIAGNOSIS — X32XXXA Exposure to sunlight, initial encounter: Secondary | ICD-10-CM | POA: Diagnosis not present

## 2019-01-18 DIAGNOSIS — D2262 Melanocytic nevi of left upper limb, including shoulder: Secondary | ICD-10-CM | POA: Diagnosis not present

## 2019-01-18 DIAGNOSIS — D2271 Melanocytic nevi of right lower limb, including hip: Secondary | ICD-10-CM | POA: Diagnosis not present

## 2019-01-18 DIAGNOSIS — D225 Melanocytic nevi of trunk: Secondary | ICD-10-CM | POA: Diagnosis not present

## 2019-01-18 DIAGNOSIS — D2272 Melanocytic nevi of left lower limb, including hip: Secondary | ICD-10-CM | POA: Diagnosis not present

## 2019-01-26 DIAGNOSIS — Z961 Presence of intraocular lens: Secondary | ICD-10-CM | POA: Diagnosis not present

## 2019-02-03 DIAGNOSIS — M25512 Pain in left shoulder: Secondary | ICD-10-CM | POA: Diagnosis not present

## 2019-02-03 DIAGNOSIS — M542 Cervicalgia: Secondary | ICD-10-CM | POA: Diagnosis not present

## 2019-02-10 DIAGNOSIS — M25512 Pain in left shoulder: Secondary | ICD-10-CM | POA: Diagnosis not present

## 2019-02-15 DIAGNOSIS — M25512 Pain in left shoulder: Secondary | ICD-10-CM | POA: Diagnosis not present

## 2019-03-14 ENCOUNTER — Other Ambulatory Visit: Payer: Self-pay | Admitting: Family Medicine

## 2019-03-14 NOTE — Telephone Encounter (Signed)
Name of Medication: Lorazepam Name of Pharmacy: [facility] or Written Date and Quantity: 01/13/19, #60 Last Office Visit and Type: 01/13/19, AWV Next Office Visit and Type:  01/15/20, CPE Prt 2 Last Controlled Substance Agreement Date: 10/08/15 Last UDS:  10/08/15

## 2019-03-15 NOTE — Telephone Encounter (Signed)
Eprescribed.

## 2019-03-20 ENCOUNTER — Encounter: Payer: Self-pay | Admitting: Family Medicine

## 2019-03-21 MED ORDER — CELECOXIB 200 MG PO CAPS: 200.0000 mg | ORAL_CAPSULE | Freq: Every day | ORAL | 3 refills | Status: DC

## 2019-05-23 ENCOUNTER — Other Ambulatory Visit: Payer: Self-pay | Admitting: Family Medicine

## 2019-05-23 NOTE — Telephone Encounter (Signed)
Name of Medication: Lorazepam Name of Pharmacy: [facility] or Written Date and Quantity: 04/18/19, #60 Last Office Visit and Type: 01/13/19, AWV Next Office Visit and Type: 01/15/20, CPE prt 2 Last Controlled Substance Agreement Date: 10/08/15 Last UDS: 10/08/15

## 2019-05-24 NOTE — Telephone Encounter (Signed)
ERx 

## 2019-07-11 ENCOUNTER — Other Ambulatory Visit: Payer: Self-pay | Admitting: Family Medicine

## 2019-07-11 NOTE — Telephone Encounter (Signed)
Last filled on 05/24/2019 #60 with 0 refill LOV 01/13/2019 AWV  Next appointment on 01/15/2020 AWV

## 2019-07-12 NOTE — Telephone Encounter (Signed)
ERx 

## 2019-08-18 ENCOUNTER — Other Ambulatory Visit: Payer: Self-pay | Admitting: Family Medicine

## 2019-08-18 DIAGNOSIS — Z1231 Encounter for screening mammogram for malignant neoplasm of breast: Secondary | ICD-10-CM

## 2019-09-05 ENCOUNTER — Other Ambulatory Visit: Payer: Self-pay | Admitting: Family Medicine

## 2019-09-05 NOTE — Telephone Encounter (Signed)
Name of Medication: Lorazepam Name of Pharmacy: [facility] or Written Date and Quantity: 07/13/19, #60 Last Office Visit and Type: 01/13/19, AWV Next Office Visit and Type: 01/15/20, AWV prt 2 Last Controlled Substance Agreement Date: 10/08/15 Last UDS: 10/08/15

## 2019-09-08 NOTE — Telephone Encounter (Signed)
ERx 

## 2019-09-27 ENCOUNTER — Ambulatory Visit
Admission: RE | Admit: 2019-09-27 | Discharge: 2019-09-27 | Disposition: A | Discharge status: Home / Self Care | Payer: PPO | Source: Ambulatory Visit | Attending: Family Medicine | Admitting: Family Medicine

## 2019-09-27 ENCOUNTER — Other Ambulatory Visit: Payer: Self-pay

## 2019-09-27 DIAGNOSIS — Z1231 Encounter for screening mammogram for malignant neoplasm of breast: Secondary | ICD-10-CM

## 2019-10-12 ENCOUNTER — Encounter: Payer: Self-pay | Admitting: Family Medicine

## 2019-10-13 MED ORDER — TRAMADOL HCL 50 MG PO TABS: 25.0000 mg | ORAL_TABLET | Freq: Two times a day (BID) | ORAL | 0 refills | Status: AC | PRN

## 2019-10-13 NOTE — Addendum Note (Signed)
Addended by: Ria Bush on: 10/13/2019 06:16 PM   Modules accepted: Orders

## 2019-10-13 NOTE — Telephone Encounter (Signed)
Left message on vm per dpr asking pt to call back to schedule OV.  I also sent same message via MyChart.

## 2019-10-31 ENCOUNTER — Other Ambulatory Visit: Payer: Self-pay | Admitting: Family Medicine

## 2019-10-31 NOTE — Telephone Encounter (Signed)
Name of Medication: Lorazepam Name of Pharmacy: [facility] or Written Date and Quantity: 09/08/19, #60 Last Office Visit and Type: 01/13/19, AWV Next Office Visit and Type: 01/15/20, AWV prt 2 Last Controlled Substance Agreement Date: 10/08/15 Last UDS: 10/08/15

## 2019-11-02 NOTE — Telephone Encounter (Signed)
ERx 

## 2019-12-11 ENCOUNTER — Other Ambulatory Visit: Payer: Self-pay | Admitting: Family Medicine

## 2019-12-11 NOTE — Telephone Encounter (Signed)
Last office visit 01/13/2019 for Shannon Kelly.  Last refilled 11/02/2019 for #60 with no refills.  UDS/Contract 10/08/2015.  CPE scheduled for 01/15/2020.

## 2019-12-12 NOTE — Telephone Encounter (Signed)
ERx 

## 2020-01-10 ENCOUNTER — Other Ambulatory Visit: Payer: Self-pay

## 2020-01-10 ENCOUNTER — Ambulatory Visit: Payer: PPO

## 2020-01-10 ENCOUNTER — Other Ambulatory Visit: Payer: Self-pay | Admitting: Family Medicine

## 2020-01-10 ENCOUNTER — Other Ambulatory Visit (INDEPENDENT_AMBULATORY_CARE_PROVIDER_SITE_OTHER): Payer: PPO

## 2020-01-10 DIAGNOSIS — N289 Disorder of kidney and ureter, unspecified: Secondary | ICD-10-CM

## 2020-01-10 DIAGNOSIS — E785 Hyperlipidemia, unspecified: Secondary | ICD-10-CM

## 2020-01-10 LAB — CBC WITH DIFFERENTIAL/PLATELET
Basophils Absolute: 0 10*3/uL (ref 0.0–0.1)
Basophils Relative: 0.8 % (ref 0.0–3.0)
Eosinophils Absolute: 0.1 10*3/uL (ref 0.0–0.7)
Eosinophils Relative: 1.9 % (ref 0.0–5.0)
HCT: 35 % — ABNORMAL LOW (ref 36.0–46.0)
Hemoglobin: 11.9 g/dL — ABNORMAL LOW (ref 12.0–15.0)
Lymphocytes Relative: 32.4 % (ref 12.0–46.0)
Lymphs Abs: 1.5 10*3/uL (ref 0.7–4.0)
MCHC: 34.1 g/dL (ref 30.0–36.0)
MCV: 92 fl (ref 78.0–100.0)
Monocytes Absolute: 0.4 10*3/uL (ref 0.1–1.0)
Monocytes Relative: 8.4 % (ref 3.0–12.0)
Neutro Abs: 2.7 10*3/uL (ref 1.4–7.7)
Neutrophils Relative %: 56.5 % (ref 43.0–77.0)
Platelets: 278 10*3/uL (ref 150.0–400.0)
RBC: 3.8 Mil/uL — ABNORMAL LOW (ref 3.87–5.11)
RDW: 13.9 % (ref 11.5–15.5)
WBC: 4.8 10*3/uL (ref 4.0–10.5)

## 2020-01-10 LAB — COMPREHENSIVE METABOLIC PANEL
ALT: 15 U/L (ref 0–35)
AST: 17 U/L (ref 0–37)
Albumin: 4.2 g/dL (ref 3.5–5.2)
Alkaline Phosphatase: 56 U/L (ref 39–117)
BUN: 25 mg/dL — ABNORMAL HIGH (ref 6–23)
CO2: 27 mEq/L (ref 19–32)
Calcium: 9.2 mg/dL (ref 8.4–10.5)
Chloride: 103 mEq/L (ref 96–112)
Creatinine, Ser: 0.98 mg/dL (ref 0.40–1.20)
GFR: 56.39 mL/min — ABNORMAL LOW (ref >60.00)
Glucose, Bld: 89 mg/dL (ref 70–99)
Potassium: 4.2 mEq/L (ref 3.5–5.1)
Sodium: 137 mEq/L (ref 135–145)
Total Bilirubin: 0.7 mg/dL (ref 0.2–1.2)
Total Protein: 6.8 g/dL (ref 6.0–8.3)

## 2020-01-10 LAB — LIPID PANEL
Cholesterol: 193 mg/dL (ref 0–200)
HDL: 53.3 mg/dL (ref >39.00)
LDL Cholesterol: 119 mg/dL — ABNORMAL HIGH (ref 0–99)
NonHDL: 139.25
Total CHOL/HDL Ratio: 4
Triglycerides: 103 mg/dL (ref 0.0–149.0)
VLDL: 20.6 mg/dL (ref 0.0–40.0)

## 2020-01-10 LAB — MICROALBUMIN / CREATININE URINE RATIO
Creatinine,U: 125 mg/dL
Microalb Creat Ratio: 0.6 mg/g (ref 0.0–30.0)
Microalb, Ur: <0.7 mg/dL (ref 0.0–1.9)

## 2020-01-10 LAB — VITAMIN D 25 HYDROXY (VIT D DEFICIENCY, FRACTURES): VITD: 36.44 ng/mL (ref 30.00–100.00)

## 2020-01-15 ENCOUNTER — Other Ambulatory Visit: Payer: Self-pay

## 2020-01-15 ENCOUNTER — Encounter: Payer: Self-pay | Admitting: Family Medicine

## 2020-01-15 ENCOUNTER — Other Ambulatory Visit: Payer: Self-pay | Admitting: Family Medicine

## 2020-01-15 ENCOUNTER — Ambulatory Visit (INDEPENDENT_AMBULATORY_CARE_PROVIDER_SITE_OTHER): Payer: PPO | Admitting: Family Medicine

## 2020-01-15 VITALS — BP 136/76 | HR 73 | Temp 97.5°F | Ht 65.5 in | Wt 162.1 lb

## 2020-01-15 DIAGNOSIS — E785 Hyperlipidemia, unspecified: Secondary | ICD-10-CM

## 2020-01-15 DIAGNOSIS — M8949 Other hypertrophic osteoarthropathy, multiple sites: Secondary | ICD-10-CM

## 2020-01-15 DIAGNOSIS — Z0000 Encounter for general adult medical examination without abnormal findings: Secondary | ICD-10-CM | POA: Diagnosis not present

## 2020-01-15 DIAGNOSIS — M150 Primary generalized (osteo)arthritis: Secondary | ICD-10-CM

## 2020-01-15 DIAGNOSIS — R3129 Other microscopic hematuria: Secondary | ICD-10-CM

## 2020-01-15 DIAGNOSIS — D649 Anemia, unspecified: Secondary | ICD-10-CM

## 2020-01-15 DIAGNOSIS — N289 Disorder of kidney and ureter, unspecified: Secondary | ICD-10-CM

## 2020-01-15 DIAGNOSIS — F5104 Psychophysiologic insomnia: Secondary | ICD-10-CM

## 2020-01-15 DIAGNOSIS — R5383 Other fatigue: Secondary | ICD-10-CM

## 2020-01-15 DIAGNOSIS — M159 Polyosteoarthritis, unspecified: Secondary | ICD-10-CM

## 2020-01-15 LAB — POC URINALSYSI DIPSTICK (AUTOMATED)
Bilirubin, UA: NEGATIVE
Glucose, UA: NEGATIVE
Ketones, UA: NEGATIVE
Leukocytes, UA: NEGATIVE
Nitrite, UA: NEGATIVE
Protein, UA: NEGATIVE
Spec Grav, UA: 1.01 (ref 1.010–1.025)
Urobilinogen, UA: 0.2 E.U./dL
pH, UA: 6 (ref 5.0–8.0)

## 2020-01-15 MED ORDER — TEMAZEPAM 15 MG PO CAPS: 15.0000 mg | ORAL_CAPSULE | Freq: Every evening | ORAL | 0 refills | Status: DC | PRN

## 2020-01-15 NOTE — Assessment & Plan Note (Signed)
Mild. Check anemia panel at lab visit in 2-3 mo.

## 2020-01-15 NOTE — Progress Notes (Signed)
This visit was conducted in person.  BP 136/76 (BP Location: Left Arm, Patient Position: Sitting, Cuff Size: Normal)   Pulse 73   Temp (!) 97.5 F (36.4 C) (Temporal)   Ht 5' 5.5" (1.664 m)   Wt 162 lb 2 oz (73.5 kg)   SpO2 96%   BMI 26.57 kg/m     CC: CPE/AMW Sub jective:    Patient ID: Shannon Kelly, female    DOB: 16-Dec-1950, 69 y.o.   MRN: [id number]  HPI: Shannon Kelly is a 69 y.o. female presenting on 01/15/2020 for Medicare Wellness   Did not see health advisor this year.    Hearing Screening   125Hz  250Hz  500Hz  1000Hz  2000Hz  3000Hz  4000Hz  6000Hz  8000Hz   Right ear:   20 20 20  20     Left ear:   20 20 20   40      Visual Acuity Screening   Right eye Left eye Both eyes  Without correction: 20/20 20/25 20/25   With correction:         Office Visit from 01/13/2019 in [place] at [facility] Total Score 0      Fall Risk  01/15/2020 01/13/2019 10/13/2017 10/13/2017 10/07/2016  Falls in the past year? 0 0 No No No      Uses lorazepam nightly to help her sleep. No daytime use. Can take 1-2 at a time because of lack of effect throughout the night. Previously tried [place], melatonin, trazodone, tylenol PM, mangesium and lunesta.   11/2019- bad sinus symptoms for 2 days, headache for 2 wks. Did resolve on its own. No loss of taste/smell.   On celebrex 200mg  daily - unsure if helpful. Ongoing aches.   Preventative: COLONOSCOPY 09/2018 - diverticulosis, rpt 5 yrs due to fmhx Carlean Purl) Well woman - with OBGYN prior in [place](Dr. Wyvonne Lenz). S/p vaginal hysterectomy 1993 for fibroids, ovaries remain.denies pelvic pain, pressure or vaginal bleeding.  MammogramWNL3/2021  DEXA 2013 - WNL. Good milk intake, leafy greens, regular weight bearing exercises.  Flu shot yearly  Prevnar2018, pneumovax 2019 Tdap - 08/2013  COVID vaccine - completed Pfizer series 09/2019 zostavax - 05/2012 shingrix - discussed, declines Advanced directives: received and  scanned into chart. Husband Jaquelyn Bitter then son Gaspar Bidding are HCPOA. Does not want life prolonging measures if terminal condition.  Seat belt use discussed.  Sunscreen use discussed. No changing moles on skin.Sees dermatologist.  Non smoker  Alcohol -rare Dentist - q4 mo Eye exam Q1-2 yrs Bowel - no constipation  Bladder - occasional urge incontinence s/p urethral sling  Lives with husband, married, 1 son Had been living in [place], return to [place] fall 2014 Occupation: Retired, prior worked at [employer]: [employer] Activity: plays with dog, outdoor walking, joined [employer] silver sneakers- restarted Y 10/2019 Diet: good water, fruits/vegetables daily     Relevant past medical, surgical, family and social history reviewed and updated as indicated. Interim medical history since our last visit reviewed. Allergies and medications reviewed and updated. Outpatient Medications Prior to Visit  Medication Sig Dispense Refill  . Biotin 5000 MCG CAPS Take 1 capsule by mouth daily.    . celecoxib (CELEBREX) 200 MG capsule Take 1 capsule (200 mg total) by mouth every other day.    . Probiotic Product (PROBIOTIC PO) Take by mouth daily.    . traMADol (ULTRAM) 50 MG tablet Take 50 mg by mouth daily. Takes 1/2 tablet as needed    . vitamin B-12 (CYANOCOBALAMIN) 500 MCG  tablet Take 500 mcg by mouth daily.    . celecoxib (CELEBREX) 200 MG capsule Take 1 capsule (200 mg total) by mouth daily. 90 capsule 3  . LORazepam (ATIVAN) 1 MG tablet TAKE 1 TABLET BY MOUTH TWICE (2) DAILY AS NEEDED 60 tablet 0  . Ibuprofen-diphenhydrAMINE Cit (ADVIL PM PO) Take by mouth.     No facility-administered medications prior to visit.     Per HPI unless specifically indicated in ROS section below Review of Systems  Constitutional: Negative for activity change, appetite change, chills, fatigue, fever and unexpected weight change.  HENT: Negative for hearing loss.   Eyes: Negative for visual  disturbance.  Respiratory: Negative for cough, chest tightness, shortness of breath and wheezing.   Cardiovascular: Negative for chest pain, palpitations and leg swelling.  Gastrointestinal: Negative for abdominal distention, abdominal pain, blood in stool, constipation, diarrhea, nausea and vomiting.  Genitourinary: Negative for difficulty urinating and hematuria.  Musculoskeletal: Negative for arthralgias, myalgias and neck pain.  Skin: Negative for rash.  Neurological: Negative for dizziness, seizures, syncope and headaches.  Hematological: Negative for adenopathy. Bruises/bleeds easily.  Psychiatric/Behavioral: Negative for dysphoric mood. The patient is not nervous/anxious.    Objective:  BP 136/76 (BP Location: Left Arm, Patient Position: Sitting, Cuff Size: Normal)   Pulse 73   Temp (!) 97.5 F (36.4 C) (Temporal)   Ht 5' 5.5" (1.664 m)   Wt 162 lb 2 oz (73.5 kg)   SpO2 96%   BMI 26.57 kg/m   Wt Readings from Last 3 Encounters:  01/15/20 162 lb 2 oz (73.5 kg)  01/13/19 159 lb (72.1 kg)  07/21/18 163 lb (73.9 kg)      Physical Exam Vitals and nursing note reviewed.  Constitutional:      General: She is not in acute distress.    Appearance: Normal appearance. She is well-developed. She is not ill-appearing.  HENT:     Head: Normocephalic and atraumatic.     Right Ear: Hearing, tympanic membrane, ear canal and external ear normal.     Left Ear: Hearing, tympanic membrane, ear canal and external ear normal.  Eyes:     General: No scleral icterus.    Extraocular Movements: Extraocular movements intact.     Conjunctiva/sclera: Conjunctivae normal.     Pupils: Pupils are equal, round, and reactive to light.  Neck:     Thyroid: No thyroid mass, thyromegaly or thyroid tenderness.     Vascular: No carotid bruit.  Cardiovascular:     Rate and Rhythm: Normal rate and regular rhythm.     Pulses: Normal pulses.          Radial pulses are 2+ on the right side and 2+ on the left  side.     Heart sounds: Normal heart sounds. No murmur heard.   Pulmonary:     Effort: Pulmonary effort is normal. No respiratory distress.     Breath sounds: Normal breath sounds. No wheezing, rhonchi or rales.  Abdominal:     General: Abdomen is flat. Bowel sounds are normal. There is no distension.     Palpations: Abdomen is soft. There is no mass.     Tenderness: There is no abdominal tenderness. There is no guarding or rebound.     Hernia: No hernia is present.  Musculoskeletal:        General: Normal range of motion.     Cervical back: Normal range of motion and neck supple.     Right lower leg: No edema.  Left lower leg: No edema.  Lymphadenopathy:     Cervical: No cervical adenopathy.  Skin:    General: Skin is warm and dry.     Findings: No rash.  Neurological:     General: No focal deficit present.     Mental Status: She is alert and oriented to person, place, and time.     Comments:  CN grossly intact, station and gait intact Recall 3/3 Calculation 5/5 DLROW  Psychiatric:        Mood and Affect: Mood normal.        Behavior: Behavior normal.        Thought Content: Thought content normal.        Judgment: Judgment normal.       Results for orders placed or performed in visit on 01/10/20  Microalbumin / creatinine urine ratio  Result Value Ref Range   Microalb, Ur <0.7 0.0 - 1.9 mg/dL   Creatinine,U 125.0 mg/dL   Microalb Creat Ratio 0.6 0.0 - 30.0 mg/g  VITAMIN D 25 Hydroxy (Vit-D Deficiency, Fractures)  Result Value Ref Range   VITD 36.44 30.00 - 100.00 ng/mL  CBC with Differential/Platelet  Result Value Ref Range   WBC 4.8 4.0 - 10.5 K/uL   RBC 3.80 (L) 3.87 - 5.11 Mil/uL   Hemoglobin 11.9 (L) 12.0 - 15.0 g/dL   HCT 35.0 (L) 36 - 46 %   MCV 92.0 78.0 - 100.0 fl   MCHC 34.1 30.0 - 36.0 g/dL   RDW 13.9 11.5 - 15.5 %   Platelets 278.0 150 - 400 K/uL   Neutrophils Relative % 56.5 43 - 77 %   Lymphocytes Relative 32.4 12 - 46 %   Monocytes Relative  8.4 3 - 12 %   Eosinophils Relative 1.9 0 - 5 %   Basophils Relative 0.8 0 - 3 %   Neutro Abs 2.7 1.4 - 7.7 K/uL   Lymphs Abs 1.5 0.7 - 4.0 K/uL   Monocytes Absolute 0.4 0 - 1 K/uL   Eosinophils Absolute 0.1 0 - 0 K/uL   Basophils Absolute 0.0 0 - 0 K/uL  Comprehensive metabolic panel  Result Value Ref Range   Sodium 137 135 - 145 mEq/L   Potassium 4.2 3.5 - 5.1 mEq/L   Chloride 103 96 - 112 mEq/L   CO2 27 19 - 32 mEq/L   Glucose, Bld 89 70 - 99 mg/dL   BUN 25 (H) 6 - 23 mg/dL   Creatinine, Ser 0.98 0.40 - 1.20 mg/dL   Total Bilirubin 0.7 0.2 - 1.2 mg/dL   Alkaline Phosphatase 56 39 - 117 U/L   AST 17 0 - 37 U/L   ALT 15 0 - 35 U/L   Total Protein 6.8 6.0 - 8.3 g/dL   Albumin 4.2 3.5 - 5.2 g/dL   GFR 56.39 (L) >60.00 mL/min   Calcium 9.2 8.4 - 10.5 mg/dL  Lipid panel  Result Value Ref Range   Cholesterol 193 0 - 200 mg/dL   Triglycerides 103.0 0 - 149 mg/dL   HDL 53.30 >39.00 mg/dL   VLDL 20.6 0.0 - 40.0 mg/dL   LDL Cholesterol 119 (H) 0 - 99 mg/dL   Total CHOL/HDL Ratio 4    NonHDL 139.25    Assessment & Plan:  This visit occurred during the SARS-CoV-2 public health emergency.  Safety protocols were in place, including screening questions prior to the visit, additional usage of staff PPE, and extensive cleaning of exam room while observing appropriate  contact time as indicated for disinfecting solutions.   Problem List Items Addressed This Visit    Renal insufficiency    Again noted. Likely component of CKD stage 3. Recent Umicroalb normal. Drop celebrex dosing to QOD.  Encouraged good hydration status.       Relevant Orders   Renal function panel   Osteoarthritis    Unsure how effective celebrex 200mg  daily is - agrees to trial celebrex 200mg  QOD to ease load on kidneys.       Relevant Medications   traMADol (ULTRAM) 50 MG tablet   celecoxib (CELEBREX) 200 MG capsule   Microhematuria    Update UA. H/o this, s/p reassuring urological eval 2015 ([facility])         Medicare annual wellness visit, subsequent - Primary    I have personally reviewed the Medicare Annual Wellness questionnaire and have noted 1. The patient's medical and social history 2. Their use of alcohol, tobacco or illicit drugs 3. Their current medications and supplements 4. The patient's functional ability including ADL's, fall risks, home safety risks and hearing or visual impairment. Cognitive function has been assessed and addressed as indicated.  5. Diet and physical activity 6. Evidence for depression or mood disorders The patients weight, height, BMI have been recorded in the chart. I have made referrals, counseling and provided education to the patient based on review of the above and I have provided the pt with a written personalized care plan for preventive services. Provider list updated.. See scanned questionairre as needed for further documentation. Reviewed preventative protocols and updated unless pt declined.       HLD (hyperlipidemia)    Chronic, stable off meds. Reviewed diet choices to improve LDL levels. The 10-year ASCVD risk score Mikey Bussing DC Brooke Bonito., et al., 2013) is: 8.5%   Values used to calculate the score:     Age: 84 years     Sex: Female     Is Non-Hispanic African American: No     Diabetic: No     Tobacco smoker: No     Systolic Blood Pressure: 734 mmHg     Is BP treated: No     HDL Cholesterol: 53.3 mg/dL     Total Cholesterol: 193 mg/dL       Health maintenance examination    Preventative protocols reviewed and updated unless pt declined. Discussed healthy diet and lifestyle.       Fatigue    Ongoing for months. Reassuring exam. RTC 2-3 mo labs for further evalaution.       Relevant Orders   Vitamin B12   Ferritin   IBC panel   Folate   TSH   CBC with Differential/Platelet   Chronic insomnia    Using lorazepam 1-2 mg at night regularly. Sometimes incomplete coverage for night time. Other meds tried also ineffective. Will trial  temazepam 30mg  at night time.       Anemia, unspecified    Mild. Check anemia panel at lab visit in 2-3 mo.       Relevant Medications   vitamin B-12 (CYANOCOBALAMIN) 500 MCG tablet   Other Relevant Orders   Vitamin B12   Ferritin   IBC panel   Folate   TSH   CBC with Differential/Platelet       Meds ordered this encounter  Medications  . temazepam (RESTORIL) 15 MG capsule    Sig: Take 1 capsule (15 mg total) by mouth at bedtime as needed for sleep.    Dispense:  30 capsule    Refill:  0    In place of lorazepam   Orders Placed This Encounter  Procedures  . Vitamin B12    Standing Status:   Future    Standing Expiration Date:   01/14/2021  . Ferritin    Standing Status:   Future    Standing Expiration Date:   01/14/2021  . IBC panel    Standing Status:   Future    Standing Expiration Date:   01/14/2021  . Folate    Standing Status:   Future    Standing Expiration Date:   01/14/2021  . TSH    Standing Status:   Future    Standing Expiration Date:   01/14/2021  . CBC with Differential/Platelet    Standing Status:   Future    Standing Expiration Date:   01/14/2021  . Renal function panel    Standing Status:   Future    Standing Expiration Date:   01/14/2021    Patient instructions: Trial temazepam (restoril) 15mg  at night time in place of lorazepam for sleep  Ensure good hydration for kidneys. Increase legumes and iron rich diet for blood counts and cholesterol levels.  Drop celebrex 200mg  to every other day.  Return in 2-3 months for lab visit only to recheck blood counts and thyroid.  Urinalysis today.   Follow up plan: Return in about 1 year (around 01/14/2021) for annual exam, prior fasting for blood work, medicare wellness visit.  Ria Bush, MD

## 2020-01-15 NOTE — Assessment & Plan Note (Signed)
Again noted. Likely component of CKD stage 3. Recent Umicroalb normal. Drop celebrex dosing to QOD.  Encouraged good hydration status.

## 2020-01-15 NOTE — Assessment & Plan Note (Signed)
Chronic, stable off meds. Reviewed diet choices to improve LDL levels. The 10-year ASCVD risk score Mikey Bussing DC Brooke Bonito., et al., 2013) is: 8.5%   Values used to calculate the score:     Age: 69 years     Sex: Female     Is Non-Hispanic African American: No     Diabetic: No     Tobacco smoker: No     Systolic Blood Pressure: 094 mmHg     Is BP treated: No     HDL Cholesterol: 53.3 mg/dL     Total Cholesterol: 193 mg/dL

## 2020-01-15 NOTE — Assessment & Plan Note (Signed)
Unsure how effective celebrex 200mg  daily is - agrees to trial celebrex 200mg  QOD to ease load on kidneys.

## 2020-01-15 NOTE — Assessment & Plan Note (Signed)
Preventative protocols reviewed and updated unless pt declined. Discussed healthy diet and lifestyle.  

## 2020-01-15 NOTE — Assessment & Plan Note (Signed)
Using lorazepam 1-2 mg at night regularly. Sometimes incomplete coverage for night time. Other meds tried also ineffective. Will trial temazepam 30mg  at night time.

## 2020-01-15 NOTE — Assessment & Plan Note (Addendum)
Update UA. H/o this, s/p reassuring urological eval 2015 ([facility])

## 2020-01-15 NOTE — Patient Instructions (Addendum)
Trial temazepam (restoril) 15mg  at night time in place of lorazepam for sleep  Ensure good hydration for kidneys. Increase legumes and iron rich diet for blood counts and cholesterol levels.  Drop celebrex 200mg  to every other day.  Return in 2-3 months for lab visit only to recheck blood counts and thyroid.  Urinalysis today.   Health Maintenance After Age 68 After age 40, you are at a higher risk for certain long-term diseases and infections as well as injuries from falls. Falls are a major cause of broken bones and head injuries in people who are older than age 40. Getting regular preventive care can help to keep you healthy and well. Preventive care includes getting regular testing and making lifestyle changes as recommended by your health care provider. Talk with your health care provider about:  Which screenings and tests you should have. A screening is a test that checks for a disease when you have no symptoms.  A diet and exercise plan that is right for you. What should I know about screenings and tests to prevent falls? Screening and testing are the best ways to find a health problem early. Early diagnosis and treatment give you the best chance of managing medical conditions that are common after age 61. Certain conditions and lifestyle choices may make you more likely to have a fall. Your health care provider may recommend:  Regular vision checks. Poor vision and conditions such as cataracts can make you more likely to have a fall. If you wear glasses, make sure to get your prescription updated if your vision changes.  Medicine review. Work with your health care provider to regularly review all of the medicines you are taking, including over-the-counter medicines. Ask your health care provider about any side effects that may make you more likely to have a fall. Tell your health care provider if any medicines that you take make you feel dizzy or sleepy.  Osteoporosis screening.  Osteoporosis is a condition that causes the bones to get weaker. This can make the bones weak and cause them to break more easily.  Blood pressure screening. Blood pressure changes and medicines to control blood pressure can make you feel dizzy.  Strength and balance checks. Your health care provider may recommend certain tests to check your strength and balance while standing, walking, or changing positions.  Foot health exam. Foot pain and numbness, as well as not wearing proper footwear, can make you more likely to have a fall.  Depression screening. You may be more likely to have a fall if you have a fear of falling, feel emotionally low, or feel unable to do activities that you used to do.  Alcohol use screening. Using too much alcohol can affect your balance and may make you more likely to have a fall. What actions can I take to lower my risk of falls? General instructions  Talk with your health care provider about your risks for falling. Tell your health care provider if: ? You fall. Be sure to tell your health care provider about all falls, even ones that seem minor. ? You feel dizzy, sleepy, or off-balance.  Take over-the-counter and prescription medicines only as told by your health care provider. These include any supplements.  Eat a healthy diet and maintain a healthy weight. A healthy diet includes low-fat dairy products, low-fat (lean) meats, and fiber from whole grains, beans, and lots of fruits and vegetables. Home safety  Remove any tripping hazards, such as rugs, cords, and clutter.  Install safety equipment such as grab bars in bathrooms and safety rails on stairs.  Keep rooms and walkways well-lit. Activity   Follow a regular exercise program to stay fit. This will help you maintain your balance. Ask your health care provider what types of exercise are appropriate for you.  If you need a cane or walker, use it as recommended by your health care provider.  Wear  supportive shoes that have nonskid soles. Lifestyle  Do not drink alcohol if your health care provider tells you not to drink.  If you drink alcohol, limit how much you have: ? 0-1 drink a day for women. ? 0-2 drinks a day for men.  Be aware of how much alcohol is in your drink. In the [place] one drink equals one typical bottle of beer (12 oz), one-half glass of wine (5 oz), or one shot of hard liquor (1 oz).  Do not use any products that contain nicotine or tobacco, such as cigarettes and e-cigarettes. If you need help quitting, ask your health care provider. Summary  Having a healthy lifestyle and getting preventive care can help to protect your health and wellness after age 65.  Screening and testing are the best way to find a health problem early and help you avoid having a fall. Early diagnosis and treatment give you the best chance for managing medical conditions that are more common for people who are older than age 74.  Falls are a major cause of broken bones and head injuries in people who are older than age 60. Take precautions to prevent a fall at home.  Work with your health care provider to learn what changes you can make to improve your health and wellness and to prevent falls. This information is not intended to replace advice given to you by your health care provider. Make sure you discuss any questions you have with your health care provider. Document Revised: 10/13/2018 Document Reviewed: 05/05/2017 Elsevier Patient Education  2020 [employer].

## 2020-01-15 NOTE — Assessment & Plan Note (Signed)
Ongoing for months. Reassuring exam. RTC 2-3 mo labs for further evalaution.

## 2020-01-15 NOTE — Assessment & Plan Note (Signed)

## 2020-01-15 NOTE — Addendum Note (Signed)
Addended by: Brenton Grills on: 1/00/7121 97:58 AM   Modules accepted: Orders

## 2020-01-16 ENCOUNTER — Encounter: Payer: Self-pay | Admitting: Family Medicine

## 2020-01-18 DIAGNOSIS — X32XXXA Exposure to sunlight, initial encounter: Secondary | ICD-10-CM | POA: Diagnosis not present

## 2020-01-18 DIAGNOSIS — D485 Neoplasm of uncertain behavior of skin: Secondary | ICD-10-CM | POA: Diagnosis not present

## 2020-01-18 DIAGNOSIS — L814 Other melanin hyperpigmentation: Secondary | ICD-10-CM | POA: Diagnosis not present

## 2020-01-19 ENCOUNTER — Encounter: Payer: Self-pay | Admitting: Family Medicine

## 2020-01-19 MED ORDER — DIAZEPAM 5 MG PO TABS
5.0000 mg | ORAL_TABLET | Freq: Every evening | ORAL | 0 refills | Status: DC | PRN
Start: 2020-01-19 — End: 2020-01-23

## 2020-01-22 ENCOUNTER — Encounter: Payer: Self-pay | Admitting: Family Medicine

## 2020-01-23 MED ORDER — LORAZEPAM 1 MG PO TABS: ORAL_TABLET | ORAL | 0 refills | Status: DC

## 2020-03-06 ENCOUNTER — Other Ambulatory Visit: Payer: Self-pay | Admitting: Family Medicine

## 2020-03-06 NOTE — Telephone Encounter (Signed)
Name of Medication: Lorazepam Name of Pharmacy: [facility] or Written Date and Quantity: 01/23/20, #60 Last Office Visit and Type: 01/15/20, AWV Next Office Visit and Type: 01/21/21, AWV prt 2 Last Controlled Substance Agreement Date: 10/08/15 Last UDS: 10/08/15

## 2020-03-06 NOTE — Telephone Encounter (Signed)
ERx 

## 2020-03-17 IMAGING — MG DIGITAL SCREENING BILATERAL MAMMOGRAM WITH TOMO AND CAD
8 series · 8 of 24 positions shown · non-contrast
Comparison: Previous exam(s).

CLINICAL DATA: Screening.

EXAM:
DIGITAL SCREENING BILATERAL MAMMOGRAM WITH TOMO AND CAD

[L MLO synth-2D]
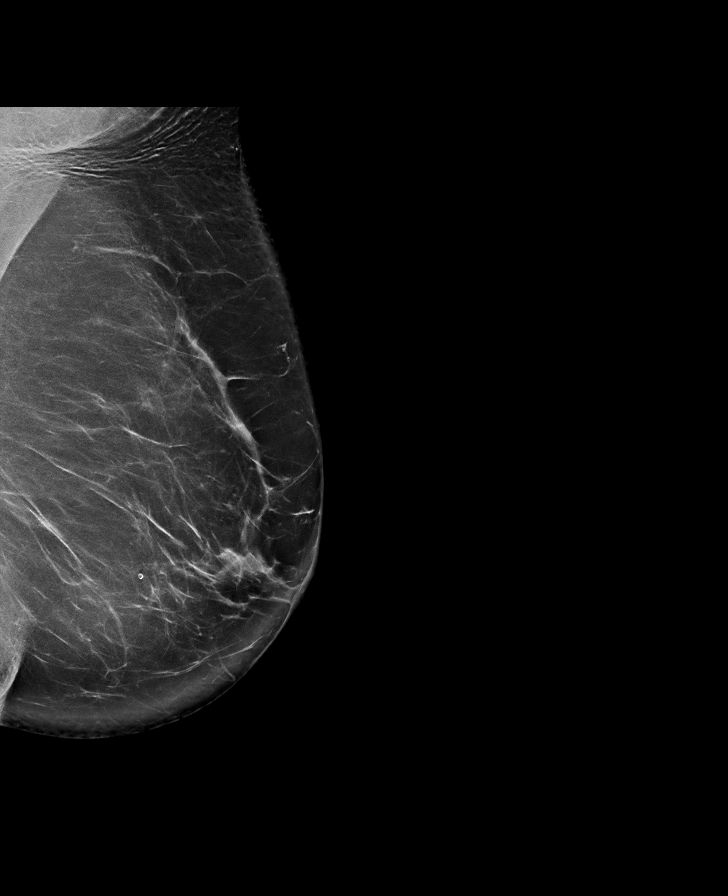

[R CC synth-2D]
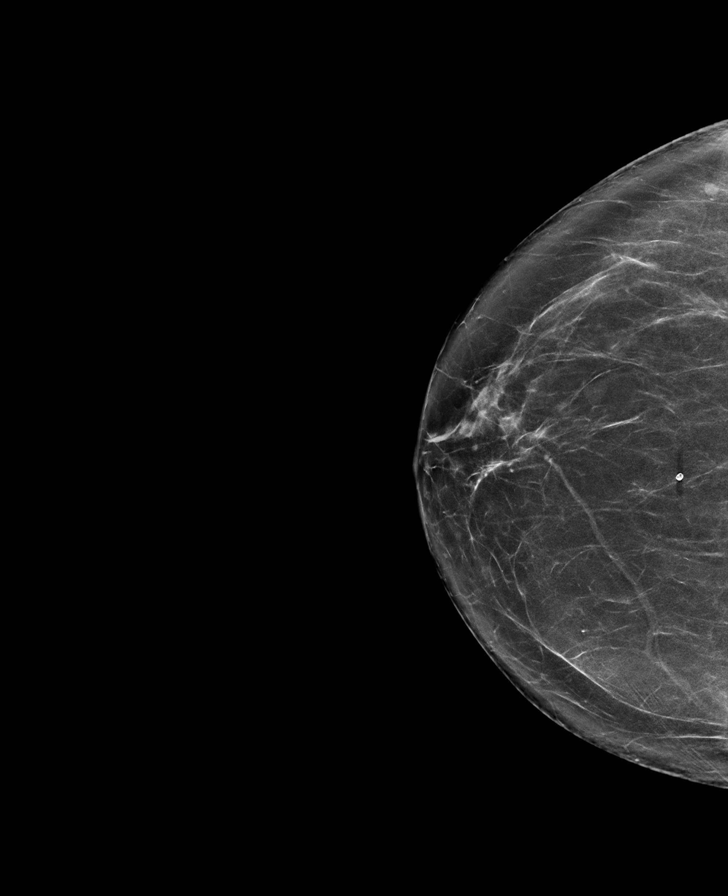

[R MLO synth-2D]
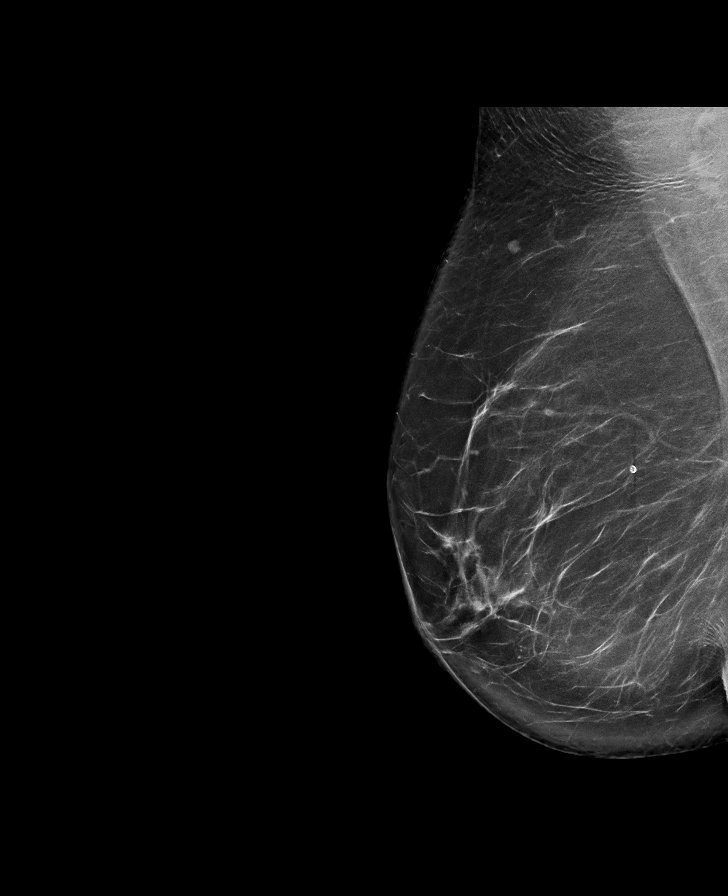

[L CC synth-2D]
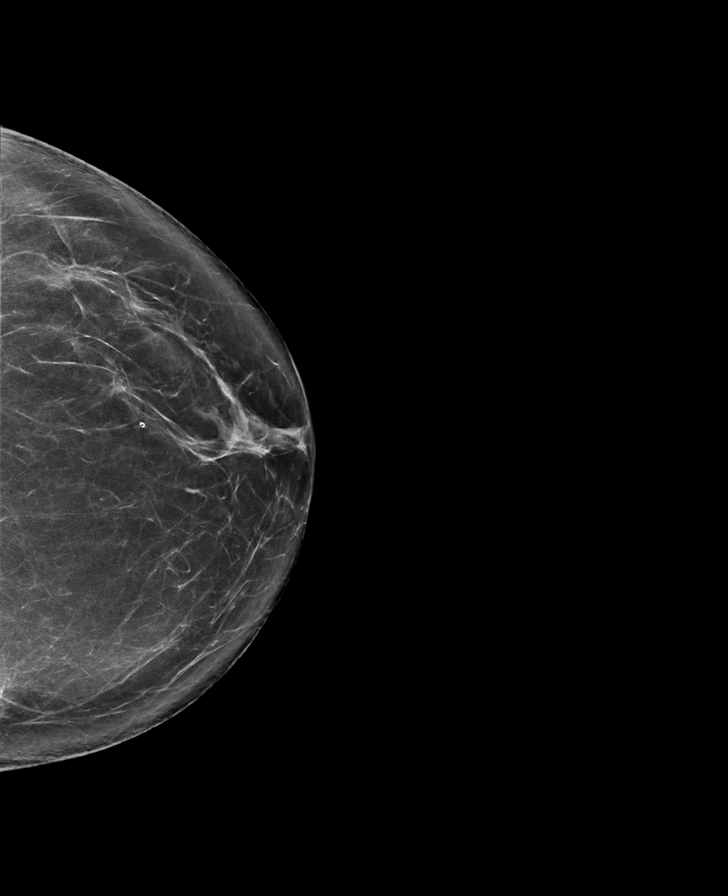

[L CC tomo · tomo slice 39/78.0]
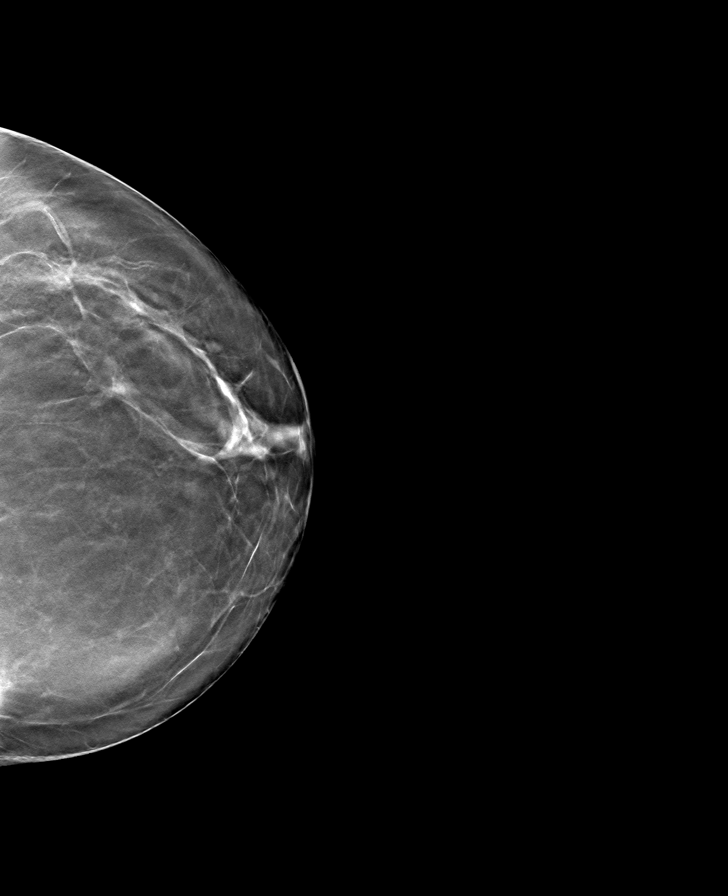

[L MLO tomo · tomo slice 46/91.0]
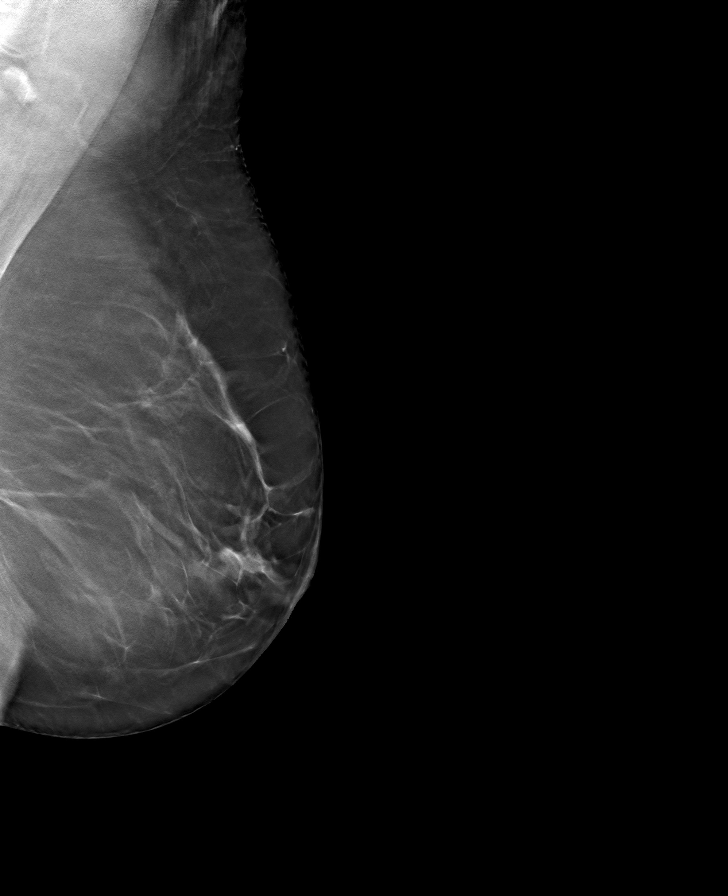

[R CC tomo · tomo slice 39/76.0]
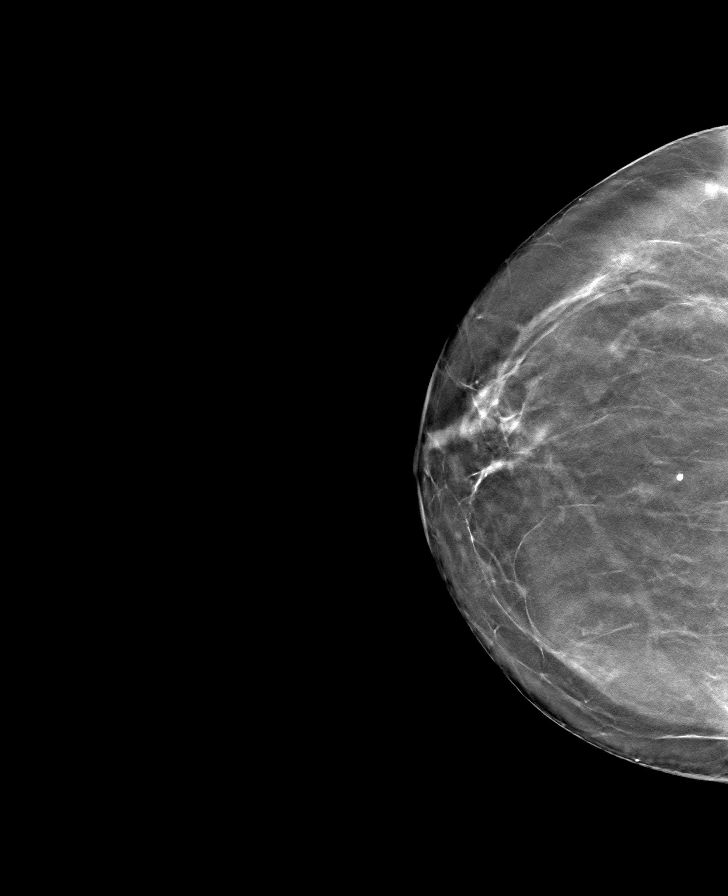

[R MLO tomo · tomo slice 45/88.0]
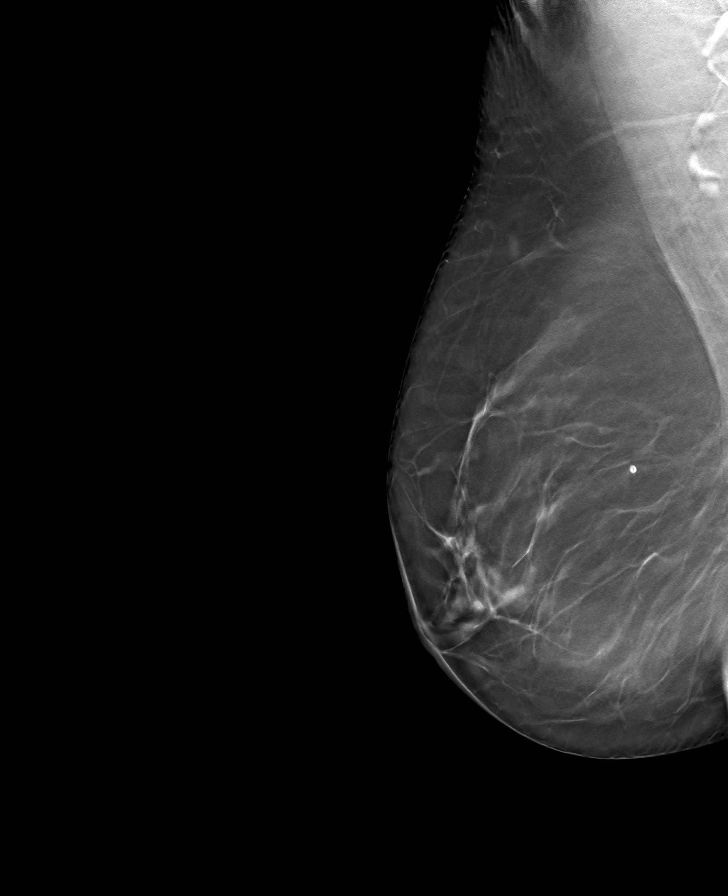

[8 of 24 positions shown; findings below may reference images not displayed]

ACR Breast Density Category b: There are scattered areas of
fibroglandular density.
FINDINGS: There are no findings suspicious for malignancy. Images were
processed with CAD.
IMPRESSION: No mammographic evidence of malignancy. A result letter of this
screening mammogram will be mailed directly to the patient.

RECOMMENDATION:
Screening mammogram in one year. (Code:[id number])

BI-RADS CATEGORY  1: Negative.

## 2020-03-20 ENCOUNTER — Other Ambulatory Visit: Payer: PPO

## 2020-04-08 ENCOUNTER — Other Ambulatory Visit (INDEPENDENT_AMBULATORY_CARE_PROVIDER_SITE_OTHER): Payer: PPO

## 2020-04-08 ENCOUNTER — Other Ambulatory Visit: Payer: Self-pay

## 2020-04-08 DIAGNOSIS — R5383 Other fatigue: Secondary | ICD-10-CM | POA: Diagnosis not present

## 2020-04-08 DIAGNOSIS — D649 Anemia, unspecified: Secondary | ICD-10-CM | POA: Diagnosis not present

## 2020-04-08 DIAGNOSIS — N289 Disorder of kidney and ureter, unspecified: Secondary | ICD-10-CM

## 2020-04-08 LAB — CBC WITH DIFFERENTIAL/PLATELET
Basophils Absolute: 0 10*3/uL (ref 0.0–0.1)
Basophils Relative: 1 % (ref 0.0–3.0)
Eosinophils Absolute: 0.1 10*3/uL (ref 0.0–0.7)
Eosinophils Relative: 2 % (ref 0.0–5.0)
HCT: 36.4 % (ref 36.0–46.0)
Hemoglobin: 12.2 g/dL (ref 12.0–15.0)
Lymphocytes Relative: 30.4 % (ref 12.0–46.0)
Lymphs Abs: 1.4 10*3/uL (ref 0.7–4.0)
MCHC: 33.5 g/dL (ref 30.0–36.0)
MCV: 92.6 fl (ref 78.0–100.0)
Monocytes Absolute: 0.3 10*3/uL (ref 0.1–1.0)
Monocytes Relative: 7.3 % (ref 3.0–12.0)
Neutro Abs: 2.8 10*3/uL (ref 1.4–7.7)
Neutrophils Relative %: 59.3 % (ref 43.0–77.0)
Platelets: 285 10*3/uL (ref 150.0–400.0)
RBC: 3.93 Mil/uL (ref 3.87–5.11)
RDW: 13.3 % (ref 11.5–15.5)
WBC: 4.7 10*3/uL (ref 4.0–10.5)

## 2020-04-08 LAB — RENAL FUNCTION PANEL
Albumin: 4.2 g/dL (ref 3.5–5.2)
BUN: 24 mg/dL — ABNORMAL HIGH (ref 6–23)
CO2: 27 mEq/L (ref 19–32)
Calcium: 9.3 mg/dL (ref 8.4–10.5)
Chloride: 104 mEq/L (ref 96–112)
Creatinine, Ser: 1.12 mg/dL (ref 0.40–1.20)
GFR: 48.3 mL/min — ABNORMAL LOW (ref >60.00)
Glucose, Bld: 84 mg/dL (ref 70–99)
Phosphorus: 3.5 mg/dL (ref 2.3–4.6)
Potassium: 4.3 mEq/L (ref 3.5–5.1)
Sodium: 139 mEq/L (ref 135–145)

## 2020-04-08 LAB — VITAMIN B12: Vitamin B-12: 501 pg/mL (ref 211–911)

## 2020-04-08 LAB — FERRITIN: Ferritin: 31.5 ng/mL (ref 10.0–291.0)

## 2020-04-08 LAB — TSH: TSH: 2.55 u[IU]/mL (ref 0.35–4.50)

## 2020-04-08 LAB — IBC PANEL
Iron: 85 ug/dL (ref 42–145)
Saturation Ratios: 23.5 % (ref 20.0–50.0)
Transferrin: 258 mg/dL (ref 212.0–360.0)

## 2020-04-08 LAB — FOLATE: Folate: 20.9 ng/mL (ref >5.9)

## 2020-04-11 ENCOUNTER — Telehealth: Payer: Self-pay

## 2020-04-11 ENCOUNTER — Encounter: Payer: Self-pay | Admitting: Family Medicine

## 2020-04-11 NOTE — Telephone Encounter (Signed)
Released via mychart

## 2020-04-11 NOTE — Telephone Encounter (Signed)
Patient called requesting lab results... okay to release comments via mychart

## 2020-04-16 ENCOUNTER — Other Ambulatory Visit: Payer: Self-pay | Admitting: Family Medicine

## 2020-04-16 NOTE — Telephone Encounter (Signed)
ERx 

## 2020-04-16 NOTE — Telephone Encounter (Signed)
Name of Medication: Lorazepam Name of Pharmacy: [facility] or Written Date and Quantity: 03/06/20, #60 Last Office Visit and Type: 01/15/20, AWV Next Office Visit and Type: 01/21/21, AWV prt 2 Last Controlled Substance Agreement Date: 10/08/15 Last UDS: 10/08/15, scanned

## 2020-06-06 ENCOUNTER — Encounter: Payer: Self-pay | Admitting: Nurse Practitioner

## 2020-06-06 ENCOUNTER — Other Ambulatory Visit: Payer: PPO

## 2020-06-06 ENCOUNTER — Telehealth: Payer: Self-pay | Admitting: Internal Medicine

## 2020-06-06 ENCOUNTER — Ambulatory Visit: Payer: PPO | Admitting: Nurse Practitioner

## 2020-06-06 VITALS — BP 112/70 | HR 68 | Ht 65.5 in | Wt 166.0 lb

## 2020-06-06 DIAGNOSIS — R197 Diarrhea, unspecified: Secondary | ICD-10-CM

## 2020-06-06 NOTE — Patient Instructions (Signed)
If you are age 68 or older, your body mass index should be between 23-30. Your Body mass index is 27.2 kg/m. If this is out of the aforementioned range listed, please consider follow up with your Primary Care Provider.  If you are age 47 or younger, your body mass index should be between 19-25. Your Body mass index is 27.2 kg/m. If this is out of the aformentioned range listed, please consider follow up with your Primary Care Provider.   Your provider has requested that you go to the basement level for lab work before leaving today. Press "B" on the elevator. The lab is located at the first door on the left as you exit the elevator.  Thank you for choosing me and [facility].  Tye Savoy , NP

## 2020-06-06 NOTE — Progress Notes (Signed)
ASSESSMENT AND PLAN    # 69 yo female with one month history of diarrhea.  Having up to 10 loose bowel movements a day including nocturnal stools.  No associated weight loss, abdominal pain or fever.  Abdominal exam benign.  Nontoxic-appearing.  Doubt infectious etiology but certainly need to exclude.  This could be a recurrence of lymphocytic colitis. --Obtain stool studies to evaluate for infection. If negative then probably see if she responds to course of Budesonide.  --Father and brother passed away with pancreatic cancer.  She inquires about any relationship between diarrhea and the pancreas.  I told her pancreatic cancer does not usually caused diarrhea.  One can have diarrhea with pancreatic insufficiency but there is no reason to suspect that in her at this point  [place]     Primary Gastroenterologist : Silvano Rusk, MD  Chief Complaint : diarrhea   Shannon Kelly is a 69 y.o. female with PMH / [place] significant for,  but not necessarily limited to: [facility] of colon cancer in mother in her 24's, diverticulosis,  lymphocytic colitis in 2014  Patient diagnosed with lymphocytic colitis in 2014, good response to Budesonide. She never has solid stool but typically has no more than two BMs a day. However, a month ago she began having increased frequency of loose, non-bloody stool. She has been having up to 10 BMs a day, including nocturnal stools. She has associated urgency. She started using CBD in October but was taking it two weeks before diarrhea started. She recently started Melatonin. She recently resumed Celebrex but has taken it intermittently for years without any associated bowel changes.  No recent antibiotics . No out of country travel. Appetite is fine. No fevers, no abdominal pain. She has been taking 2 imodium a day which decreases the frequency but not consistency of bowel movements.  Last colonoscopy January 2020 with findings of hemorrhoids and  diverticulosis  Patient's dad and her brother passed away with pancreatic cancer. Another brother had RCC. She wants to make sure that bowel changes aren't related to her pancreas.   Data Reviewed: October 2021 CBC normal  Previous Endoscopic Evaluations / Pertinent Studies:  Jan 2020 Screening colonoscopy  -External and internal hemorrhoids. - Diverticulosis in the left colon. - The examined portion of the ileum was normal. - The examination was otherwise normal on direct and retroflexion views. - No specimens collected.  Past Medical History:  Diagnosis Date  . Anxiety   . Arthritis   . Cataract   . Depression   . Diverticula of colon    sigmoid  . Family history of breast cancer   . Family history of colon cancer   . Family history of lymphoma   . Family history of pancreatic cancer   . Family history of prostate cancer   . Family history of renal cell carcinoma   . Family history of stomach cancer   . HLD (hyperlipidemia)   . Insomnia   . 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 Microhematuria 06/2013   stable CT and [place], seemed to resolve, released from uro care 03/2014 [facility])  . Polyp, sigmoid colon    hyperplastic  . PONV (postoperative nausea and vomiting)    OK after last cataract surgery  . Postmenopausal atrophic vaginitis    responded to vagifem  . Urine incontinence     Current Medications, Allergies, Past Surgical History, Family History and Social History were reviewed in [employer] record.  Current Outpatient Medications  Medication Sig Dispense Refill  . Biotin 5000 MCG CAPS Take 1 capsule by mouth daily.    . celecoxib (CELEBREX) 200 MG capsule Take 1 capsule (200 mg total) by mouth every other day.    Marland Kitchen LORazepam (ATIVAN) 1 MG tablet TAKE 1 TABLET BY MOUTH TWICE A DAY AS NEEDED 60 tablet 0  . Probiotic Product (PROBIOTIC PO) Take by mouth daily.    . traMADol (ULTRAM) 50 MG tablet Take 50 mg by mouth daily. Takes 1/2 tablet as  needed    . vitamin B-12 (CYANOCOBALAMIN) 500 MCG tablet Take 500 mcg by mouth daily.     No current facility-administered medications for this visit.    Review of Systems: No chest pain. No shortness of breath. No urinary complaints.   PHYSICAL EXAM :    Wt Readings from Last 3 Encounters:  01/15/20 162 lb 2 oz (73.5 kg)  01/13/19 159 lb (72.1 kg)  07/21/18 163 lb (73.9 kg)    BP 112/70   Pulse 68   Ht 5' 5.5" (1.664 m)   Wt 166 lb (75.3 kg)   BMI 27.20 kg/m  Constitutional:  Pleasant female in no acute distress. Psychiatric: Normal mood and affect. Behavior is normal. EENT: Pupils normal.  Conjunctivae are normal. No scleral icterus. Neck supple.  Cardiovascular: Normal rate, regular rhythm. No edema Pulmonary/chest: Effort normal and breath sounds normal. No wheezing, rales or rhonchi. Abdominal: Soft, nondistended, nontender. Bowel sounds active throughout. There are no masses palpable. No hepatomegaly. Neurological: Alert and oriented to person place and time. Skin: Skin is warm and dry. No rashes noted.  Tye Savoy, NP  06/06/2020, 1:35 PM

## 2020-06-06 NOTE — Telephone Encounter (Signed)
Patient with one month hx of diarrhea.  She has a hx of lymphocytic colitis.  She will come in and see Tye Savoy RNP today.

## 2020-06-10 ENCOUNTER — Telehealth: Payer: Self-pay | Admitting: Nurse Practitioner

## 2020-06-10 LAB — GI PROFILE, STOOL, PCR

## 2020-06-10 NOTE — Telephone Encounter (Signed)
Pt is requesting a call back from a nurse to discuss a medication she needs prescribed for her diarrhea.  Pharmacy [facility]

## 2020-06-10 NOTE — Telephone Encounter (Signed)
Called patient and let her know we were waiting on stool studies before prescribing medication.

## 2020-06-12 ENCOUNTER — Other Ambulatory Visit: Payer: Self-pay

## 2020-06-12 MED ORDER — BUDESONIDE 3 MG PO CPEP: 9.0000 mg | ORAL_CAPSULE | Freq: Every day | ORAL | 1 refills | Status: DC

## 2020-07-08 ENCOUNTER — Other Ambulatory Visit: Payer: Self-pay | Admitting: Family Medicine

## 2020-07-09 NOTE — Telephone Encounter (Signed)
ERx 

## 2020-07-15 IMAGING — DX LEFT SHOULDER - 2+ VIEW
3 series · 3 of 3 positions shown · non-contrast
Comparison: None.

CLINICAL DATA: Chronic left shoulder pain.

EXAM:
LEFT SHOULDER - 2+ VIEW

[shoulder axial]
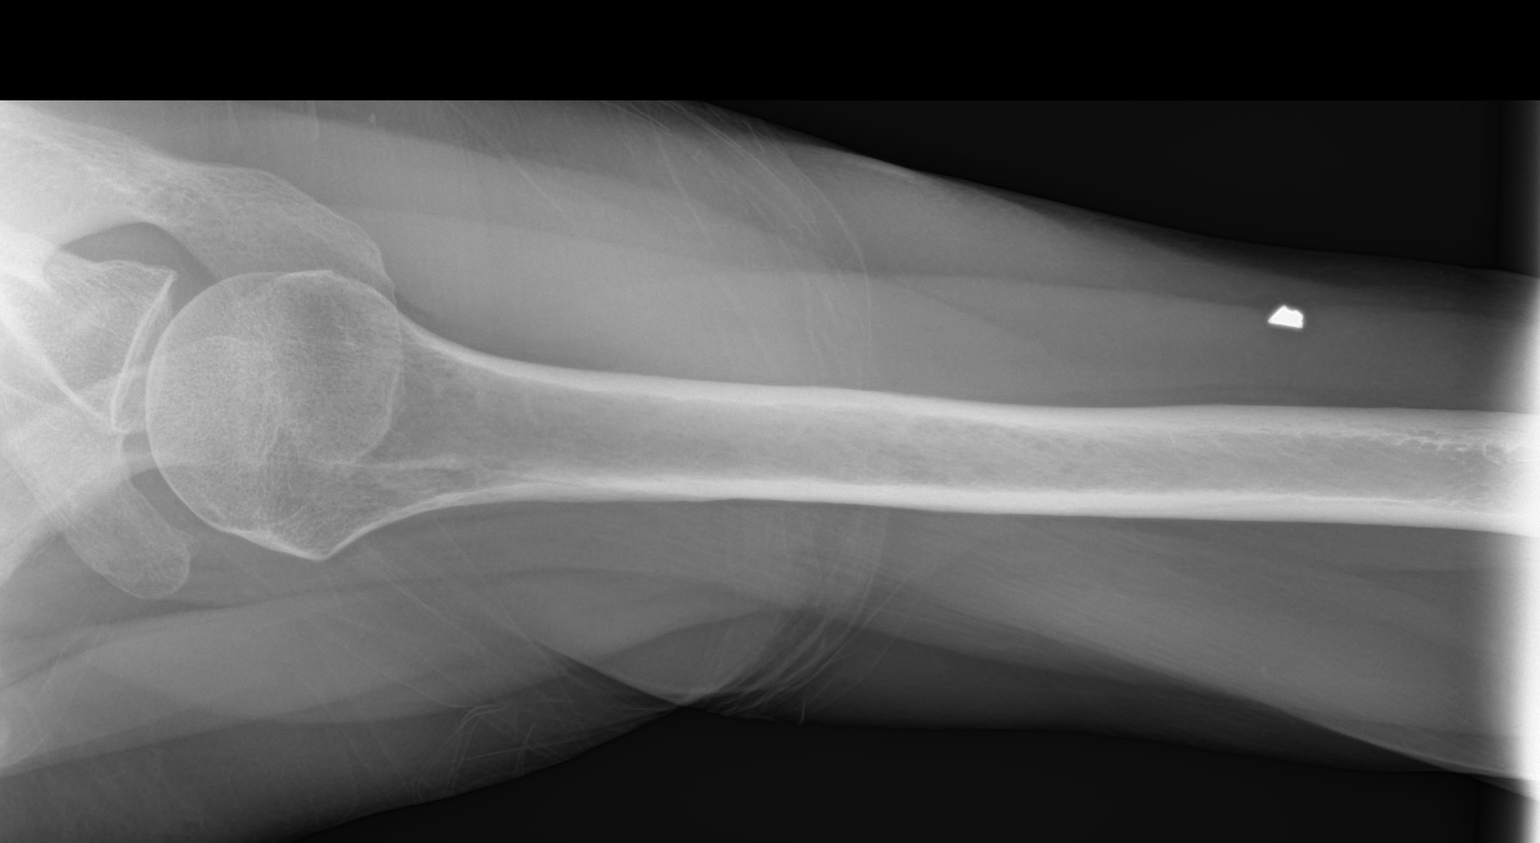

[shoulder obl]
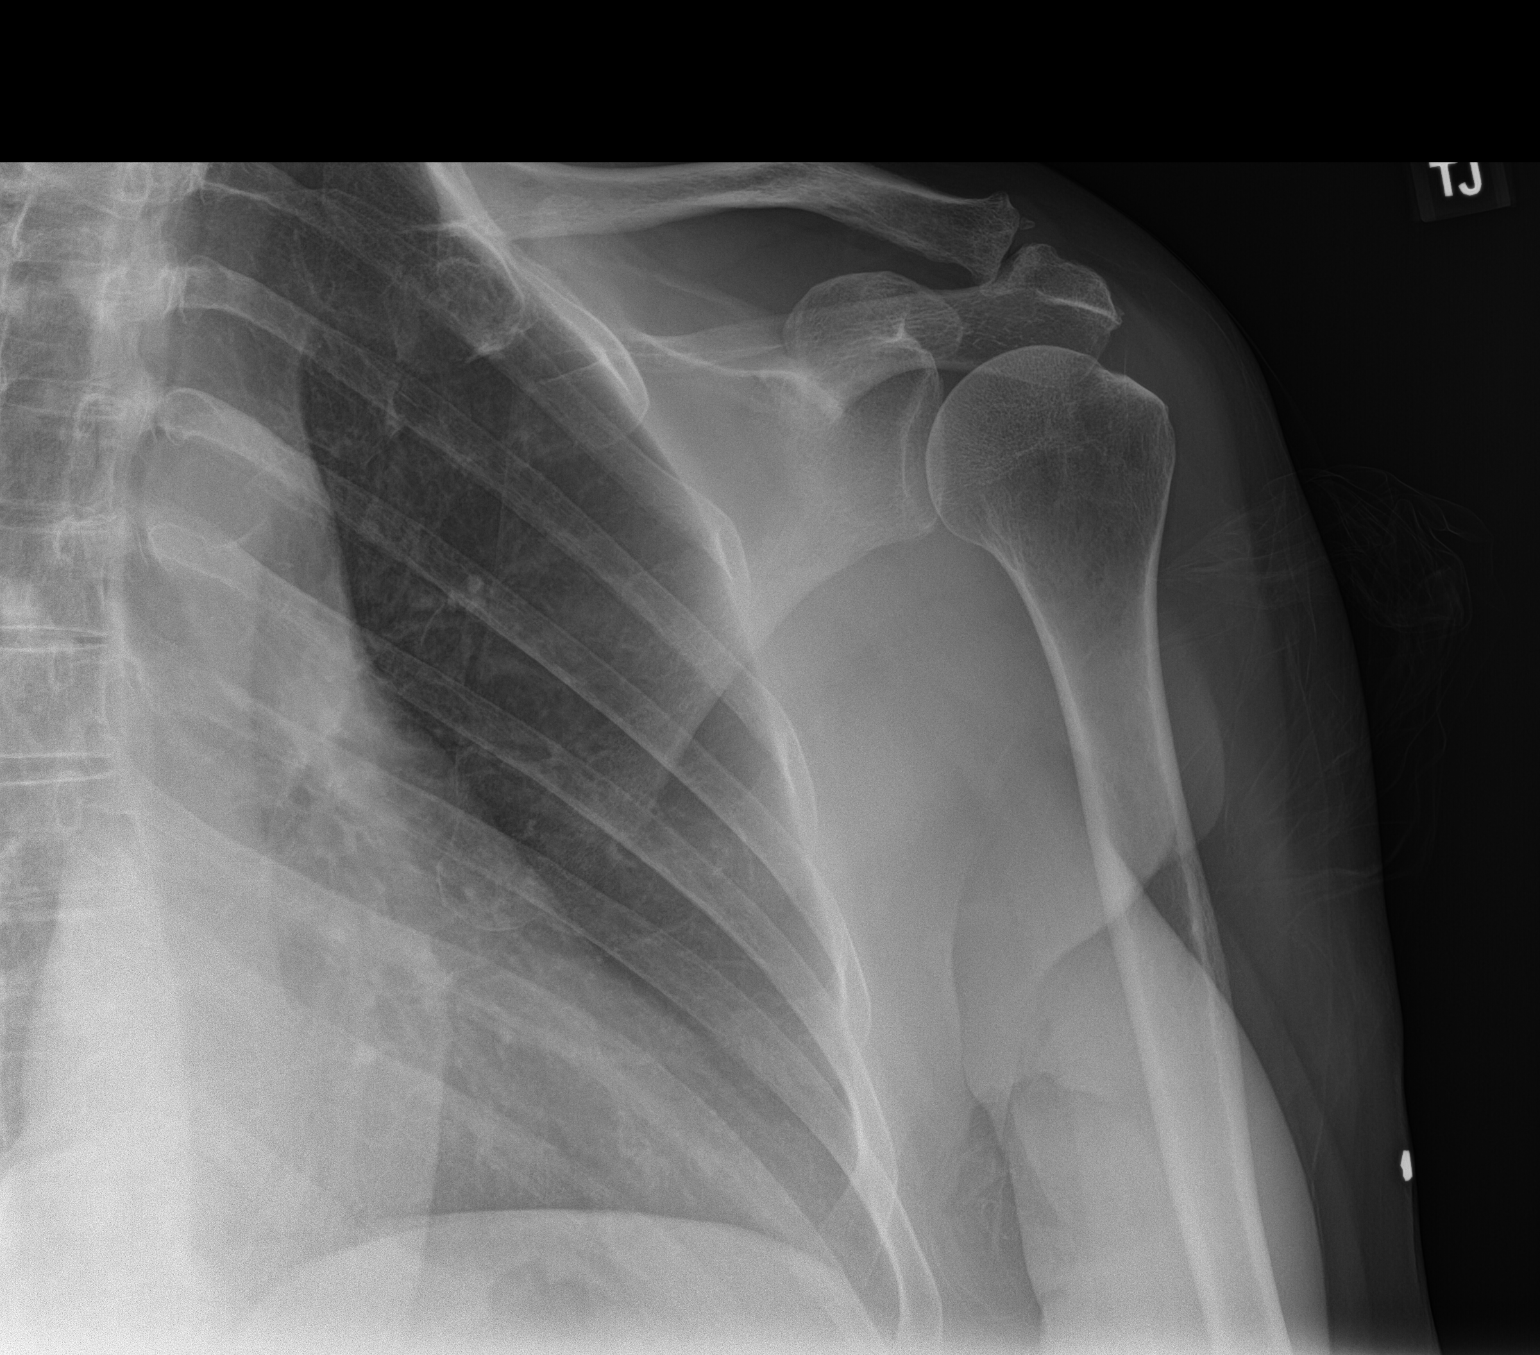

[shoulder y-view]
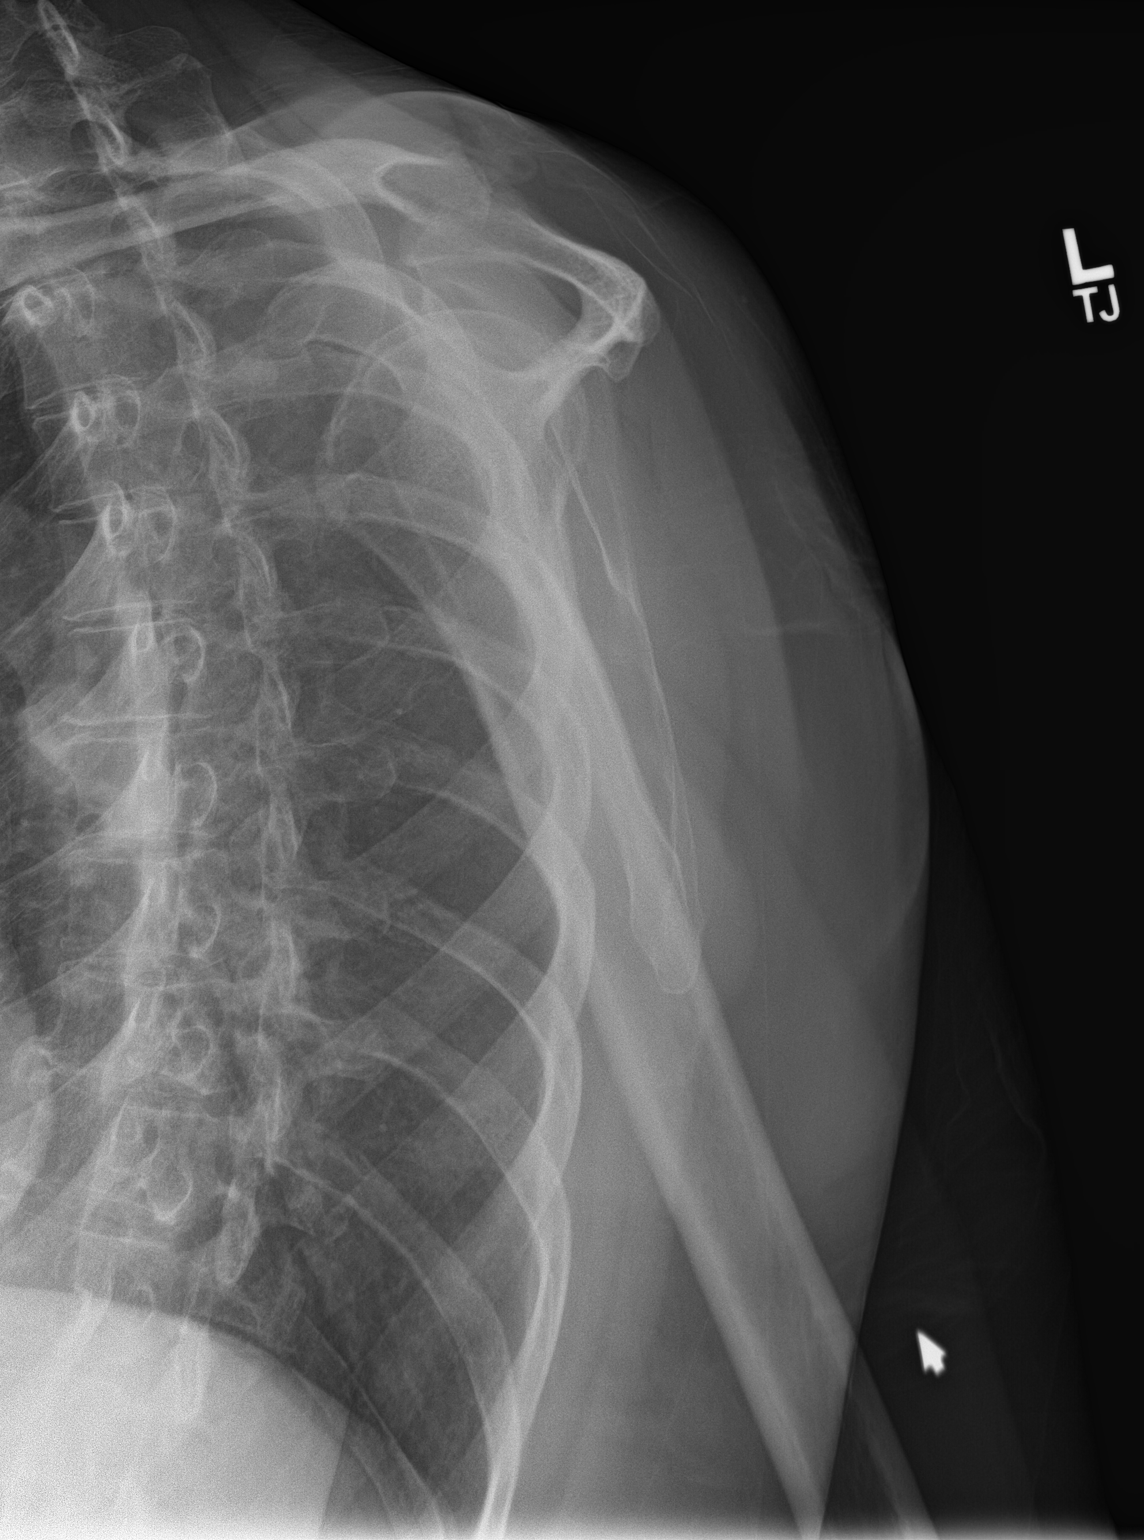

[3 of 3 positions shown; findings below may reference images not displayed]

FINDINGS: There is no evidence of fracture or dislocation. Moderate
degenerative changes seen involving the left acromioclavicular
joint. Soft tissues are unremarkable.
IMPRESSION: Moderate degenerative joint disease of the left acromioclavicular
joint. No acute abnormality seen in the left shoulder or visualized
portion of proximal left humerus.

## 2020-07-23 ENCOUNTER — Ambulatory Visit: Payer: PPO | Admitting: Internal Medicine

## 2020-08-14 ENCOUNTER — Other Ambulatory Visit: Payer: Self-pay | Admitting: Family Medicine

## 2020-08-14 DIAGNOSIS — Z1231 Encounter for screening mammogram for malignant neoplasm of breast: Secondary | ICD-10-CM

## 2020-08-29 ENCOUNTER — Other Ambulatory Visit: Payer: Self-pay | Admitting: Family Medicine

## 2020-08-29 NOTE — Telephone Encounter (Signed)
Name of Medication: Lorazepam, Tramadol Name of Pharmacy: [facility] or Written Date and Quantity:       Lorazepam- 07/10/20, #60      Tramadol- 10/14/19, #20 Last Office Visit and Type: 01/15/20, AWV Next Office Visit and Type: 01/21/21, AWV prt 2 Last Controlled Substance Agreement Date: 10/08/15 Last UDS: 10/08/15

## 2020-08-29 NOTE — Telephone Encounter (Signed)
Celebrex Last filled:  03/05/20, #90 Last OV:  01/15/20, AWV Next OV:  01/21/21, AWV prt 2

## 2020-08-30 NOTE — Telephone Encounter (Signed)
ERx 

## 2020-10-02 ENCOUNTER — Other Ambulatory Visit: Payer: Self-pay

## 2020-10-02 ENCOUNTER — Ambulatory Visit
Admission: RE | Admit: 2020-10-02 | Discharge: 2020-10-02 | Disposition: A | Discharge status: Home / Self Care | Payer: PPO | Source: Ambulatory Visit | Attending: Family Medicine | Admitting: Family Medicine

## 2020-10-02 DIAGNOSIS — Z1231 Encounter for screening mammogram for malignant neoplasm of breast: Secondary | ICD-10-CM | POA: Diagnosis not present

## 2020-10-03 ENCOUNTER — Ambulatory Visit: Payer: PPO

## 2020-10-17 ENCOUNTER — Other Ambulatory Visit: Payer: Self-pay | Admitting: Family Medicine

## 2020-10-17 NOTE — Telephone Encounter (Signed)
ERx 

## 2020-10-17 NOTE — Telephone Encounter (Signed)
Name of Medication: Lorazepam Name of Pharmacy: [facility] or Written Date and Quantity: 08/30/20, #60 Last Office Visit and Type: 01/15/20, AWV  Next Office Visit and Type: 01/21/21, AWV prt 2 Last Controlled Substance Agreement Date: 10/08/15 Last UDS: 10/08/15

## 2020-11-15 DIAGNOSIS — M25551 Pain in right hip: Secondary | ICD-10-CM | POA: Diagnosis not present

## 2020-11-15 DIAGNOSIS — M25561 Pain in right knee: Secondary | ICD-10-CM | POA: Diagnosis not present

## 2020-11-15 DIAGNOSIS — M25562 Pain in left knee: Secondary | ICD-10-CM | POA: Diagnosis not present

## 2020-11-15 DIAGNOSIS — M25552 Pain in left hip: Secondary | ICD-10-CM | POA: Diagnosis not present

## 2020-11-15 DIAGNOSIS — M545 Low back pain, unspecified: Secondary | ICD-10-CM | POA: Diagnosis not present

## 2021-01-11 ENCOUNTER — Other Ambulatory Visit: Payer: Self-pay | Admitting: Family Medicine

## 2021-01-11 DIAGNOSIS — K52832 Lymphocytic colitis: Secondary | ICD-10-CM

## 2021-01-11 DIAGNOSIS — E785 Hyperlipidemia, unspecified: Secondary | ICD-10-CM

## 2021-01-11 DIAGNOSIS — D649 Anemia, unspecified: Secondary | ICD-10-CM

## 2021-01-11 DIAGNOSIS — N289 Disorder of kidney and ureter, unspecified: Secondary | ICD-10-CM

## 2021-01-15 ENCOUNTER — Ambulatory Visit: Payer: PPO

## 2021-01-15 ENCOUNTER — Telehealth: Payer: Self-pay

## 2021-01-15 ENCOUNTER — Other Ambulatory Visit: Payer: Self-pay

## 2021-01-15 ENCOUNTER — Other Ambulatory Visit (INDEPENDENT_AMBULATORY_CARE_PROVIDER_SITE_OTHER): Payer: PPO

## 2021-01-15 DIAGNOSIS — K52832 Lymphocytic colitis: Secondary | ICD-10-CM | POA: Diagnosis not present

## 2021-01-15 DIAGNOSIS — N289 Disorder of kidney and ureter, unspecified: Secondary | ICD-10-CM

## 2021-01-15 DIAGNOSIS — E785 Hyperlipidemia, unspecified: Secondary | ICD-10-CM

## 2021-01-15 LAB — TSH: TSH: 2.21 u[IU]/mL (ref 0.35–5.50)

## 2021-01-15 LAB — COMPREHENSIVE METABOLIC PANEL
ALT: 15 U/L (ref 0–35)
AST: 16 U/L (ref 0–37)
Albumin: 4.2 g/dL (ref 3.5–5.2)
Alkaline Phosphatase: 69 U/L (ref 39–117)
BUN: 17 mg/dL (ref 6–23)
CO2: 30 mEq/L (ref 19–32)
Calcium: 9.7 mg/dL (ref 8.4–10.5)
Chloride: 104 mEq/L (ref 96–112)
Creatinine, Ser: 1.02 mg/dL (ref 0.40–1.20)
GFR: 56.21 mL/min — ABNORMAL LOW (ref >60.00)
Glucose, Bld: 89 mg/dL (ref 70–99)
Potassium: 4.6 mEq/L (ref 3.5–5.1)
Sodium: 140 mEq/L (ref 135–145)
Total Bilirubin: 0.8 mg/dL (ref 0.2–1.2)
Total Protein: 7.1 g/dL (ref 6.0–8.3)

## 2021-01-15 LAB — CBC WITH DIFFERENTIAL/PLATELET
Basophils Absolute: 0 10*3/uL (ref 0.0–0.1)
Basophils Relative: 0.7 % (ref 0.0–3.0)
Eosinophils Absolute: 0.1 10*3/uL (ref 0.0–0.7)
Eosinophils Relative: 1.6 % (ref 0.0–5.0)
HCT: 36.4 % (ref 36.0–46.0)
Hemoglobin: 12.7 g/dL (ref 12.0–15.0)
Lymphocytes Relative: 29.7 % (ref 12.0–46.0)
Lymphs Abs: 1.5 10*3/uL (ref 0.7–4.0)
MCHC: 34.8 g/dL (ref 30.0–36.0)
MCV: 90.5 fl (ref 78.0–100.0)
Monocytes Absolute: 0.4 10*3/uL (ref 0.1–1.0)
Monocytes Relative: 7.9 % (ref 3.0–12.0)
Neutro Abs: 3.1 10*3/uL (ref 1.4–7.7)
Neutrophils Relative %: 60.1 % (ref 43.0–77.0)
Platelets: 288 10*3/uL (ref 150.0–400.0)
RBC: 4.02 Mil/uL (ref 3.87–5.11)
RDW: 13.5 % (ref 11.5–15.5)
WBC: 5.2 10*3/uL (ref 4.0–10.5)

## 2021-01-15 LAB — LIPID PANEL
Cholesterol: 221 mg/dL — ABNORMAL HIGH (ref 0–200)
HDL: 56 mg/dL (ref >39.00)
LDL Cholesterol: 139 mg/dL — ABNORMAL HIGH (ref 0–99)
NonHDL: 164.75
Total CHOL/HDL Ratio: 4
Triglycerides: 129 mg/dL (ref 0.0–149.0)
VLDL: 25.8 mg/dL (ref 0.0–40.0)

## 2021-01-15 LAB — VITAMIN D 25 HYDROXY (VIT D DEFICIENCY, FRACTURES): VITD: 31.64 ng/mL (ref 30.00–100.00)

## 2021-01-15 NOTE — Telephone Encounter (Signed)
Noted. Will discuss at upcoming appt.

## 2021-01-15 NOTE — Telephone Encounter (Signed)
FYI- Called patient to complete her AWV. Asked patient to verify her date of birth and she hung up the phone on me. Appointment was cancelled.

## 2021-01-17 DIAGNOSIS — D2261 Melanocytic nevi of right upper limb, including shoulder: Secondary | ICD-10-CM | POA: Diagnosis not present

## 2021-01-17 DIAGNOSIS — D2271 Melanocytic nevi of right lower limb, including hip: Secondary | ICD-10-CM | POA: Diagnosis not present

## 2021-01-17 DIAGNOSIS — D2272 Melanocytic nevi of left lower limb, including hip: Secondary | ICD-10-CM | POA: Diagnosis not present

## 2021-01-17 DIAGNOSIS — D2262 Melanocytic nevi of left upper limb, including shoulder: Secondary | ICD-10-CM | POA: Diagnosis not present

## 2021-01-17 DIAGNOSIS — D225 Melanocytic nevi of trunk: Secondary | ICD-10-CM | POA: Diagnosis not present

## 2021-01-21 ENCOUNTER — Other Ambulatory Visit: Payer: Self-pay

## 2021-01-21 ENCOUNTER — Encounter: Payer: Self-pay | Admitting: Family Medicine

## 2021-01-21 ENCOUNTER — Ambulatory Visit (INDEPENDENT_AMBULATORY_CARE_PROVIDER_SITE_OTHER): Payer: PPO | Admitting: Family Medicine

## 2021-01-21 VITALS — BP 122/70 | HR 82 | Temp 97.2°F | Ht 65.25 in | Wt 163.3 lb

## 2021-01-21 DIAGNOSIS — Z0000 Encounter for general adult medical examination without abnormal findings: Secondary | ICD-10-CM | POA: Diagnosis not present

## 2021-01-21 DIAGNOSIS — N1831 Chronic kidney disease, stage 3a: Secondary | ICD-10-CM

## 2021-01-21 DIAGNOSIS — F5104 Psychophysiologic insomnia: Secondary | ICD-10-CM

## 2021-01-21 DIAGNOSIS — M159 Polyosteoarthritis, unspecified: Secondary | ICD-10-CM

## 2021-01-21 DIAGNOSIS — D649 Anemia, unspecified: Secondary | ICD-10-CM

## 2021-01-21 DIAGNOSIS — E785 Hyperlipidemia, unspecified: Secondary | ICD-10-CM

## 2021-01-21 DIAGNOSIS — M8949 Other hypertrophic osteoarthropathy, multiple sites: Secondary | ICD-10-CM

## 2021-01-21 MED ORDER — CELECOXIB 200 MG PO CAPS: 200.0000 mg | ORAL_CAPSULE | Freq: Every day | ORAL | 3 refills | Status: DC

## 2021-01-21 NOTE — Assessment & Plan Note (Signed)
Continues celebrex 200mg  daily with benefit.

## 2021-01-21 NOTE — Assessment & Plan Note (Signed)

## 2021-01-21 NOTE — Patient Instructions (Addendum)
If interested, check with pharmacy about new 2 shot shingles series (shingrix).  You are doing well today Continue current medicines Return as needed or in 1 year for next wellness visit/physical.   Health Maintenance After Age 69 After age 40, you are at a higher risk for certain long-term diseases and infections as well as injuries from falls. Falls are a major cause of broken bones and head injuries in people who are older than age 4. Getting regular preventive care can help to keep you healthy and well. Preventive care includes getting regular testing and making lifestyle changes as recommended by your health care provider. Talk with your health care provider about: Which screenings and tests you should have. A screening is a test that checks for a disease when you have no symptoms. A diet and exercise plan that is right for you. What should I know about screenings and tests to prevent falls? Screening and testing are the best ways to find a health problem early. Early diagnosis and treatment give you the best chance of managing medical conditions that are common after age 38. Certain conditions and lifestyle choices may make you more likely to have a fall. Your health care provider may recommend: Regular vision checks. Poor vision and conditions such as cataracts can make you more likely to have a fall. If you wear glasses, make sure to get your prescription updated if your vision changes. Medicine review. Work with your health care provider to regularly review all of the medicines you are taking, including over-the-counter medicines. Ask your health care provider about any side effects that may make you more likely to have a fall. Tell your health care provider if any medicines that you take make you feel dizzy or sleepy. Osteoporosis screening. Osteoporosis is a condition that causes the bones to get weaker. This can make the bones weak and cause them to break more easily. Blood pressure  screening. Blood pressure changes and medicines to control blood pressure can make you feel dizzy. Strength and balance checks. Your health care provider may recommend certain tests to check your strength and balance while standing, walking, or changing positions. Foot health exam. Foot pain and numbness, as well as not wearing proper footwear, can make you more likely to have a fall. Depression screening. You may be more likely to have a fall if you have a fear of falling, feel emotionally low, or feel unable to do activities that you used to do. Alcohol use screening. Using too much alcohol can affect your balance and may make you more likely to have a fall. What actions can I take to lower my risk of falls? General instructions Talk with your health care provider about your risks for falling. Tell your health care provider if: You fall. Be sure to tell your health care provider about all falls, even ones that seem minor. You feel dizzy, sleepy, or off-balance. Take over-the-counter and prescription medicines only as told by your health care provider. These include any supplements. Eat a healthy diet and maintain a healthy weight. A healthy diet includes low-fat dairy products, low-fat (lean) meats, and fiber from whole grains, beans, and lots of fruits and vegetables. Home safety Remove any tripping hazards, such as rugs, cords, and clutter. Install safety equipment such as grab bars in bathrooms and safety rails on stairs. Keep rooms and walkways well-lit. Activity  Follow a regular exercise program to stay fit. This will help you maintain your balance. Ask your health care provider  what types of exercise are appropriate for you. If you need a cane or walker, use it as recommended by your health care provider. Wear supportive shoes that have nonskid soles.  Lifestyle Do not drink alcohol if your health care provider tells you not to drink. If you drink alcohol, limit how much you  have: 0-1 drink a day for women. 0-2 drinks a day for men. Be aware of how much alcohol is in your drink. In the [place] one drink equals one typical bottle of beer (12 oz), one-half glass of wine (5 oz), or one shot of hard liquor (1 oz). Do not use any products that contain nicotine or tobacco, such as cigarettes and e-cigarettes. If you need help quitting, ask your health care provider. Summary Having a healthy lifestyle and getting preventive care can help to protect your health and wellness after age 73. Screening and testing are the best way to find a health problem early and help you avoid having a fall. Early diagnosis and treatment give you the best chance for managing medical conditions that are more common for people who are older than age 1. Falls are a major cause of broken bones and head injuries in people who are older than age 33. Take precautions to prevent a fall at home. Work with your health care provider to learn what changes you can make to improve your health and wellness and to prevent falls. This information is not intended to replace advice given to you by your health care provider. Make sure you discuss any questions you have with your healthcare provider. Document Revised: 06/07/2020 Document Reviewed: 06/07/2020 Elsevier Patient Education  2022 [employer].

## 2021-01-21 NOTE — Assessment & Plan Note (Signed)
Reviewed readings. Encouraged healthy diet choices to improve cholesterol levels.  The 10-year ASCVD risk score Mikey Bussing DC Brooke Bonito., et al., 2013) is: 8%   Values used to calculate the score:     Age: 70 years     Sex: Female     Is Non-Hispanic African American: No     Diabetic: No     Tobacco smoker: No     Systolic Blood Pressure: 749 mmHg     Is BP treated: No     HDL Cholesterol: 56 mg/dL     Total Cholesterol: 221 mg/dL

## 2021-01-21 NOTE — Assessment & Plan Note (Signed)
This has resolved.

## 2021-01-21 NOTE — Progress Notes (Signed)
Patient ID: Shannon Kelly, female    DOB: May 31, 1951, 70 y.o.   MRN: [id number]  This visit was conducted in person.  BP 122/70   Pulse 82   Temp (!) 97.2 F (36.2 C) (Temporal)   Ht 5' 5.25" (1.657 m)   Wt 163 lb 4.8 oz (74.1 kg)   SpO2 99%   BMI 26.97 kg/m    CC: AMW Subjective:   HPI: Shannon Kelly is a 70 y.o. female presenting on 01/21/2021 for Medicare Wellness (AWV-.  )   Did not see health advisor this year.   Hearing Screening   500Hz  1000Hz  2000Hz  4000Hz   Right ear 20 20 20 20   Left ear 20 20 20 20    Vision Screening   Right eye Left eye Both eyes  Without correction     With correction 20/20 20/20 20/20     Flowsheet Row Office Visit from 01/21/2021 in [facility] [place]  PHQ-2 Total Score 0       Fall Risk  01/15/2020 01/13/2019 10/13/2017 10/13/2017 10/07/2016  Falls in the past year? 0 0 No No No    Lymphocytic colitis - treated with budesonide course 06/2020 without much improvement - she actually found that melatonin caused this diarrhea.   Lower extremity aching from hips to knees - manages with celebrex effectively. Saw ortho, told likely coming from lower back.   Preventative: COLONOSCOPY 09/2018 - diverticulosis, rpt 5 yrs due to fmhx Carlean Purl) Well woman - with OBGYN prior in [place] (Dr. Wyvonne Lenz). S/p vaginal hysterectomy 1993 for fibroids, ovaries remain. Denies pelvic pain, pressure or vaginal bleeding, GYN skin changes.  [facility] - WNL. Good milk intake, leafy greens, regular weight bearing exercises.  Lung cancer screening - not eligible  Flu shot yearly COVID vaccine - [place] 08/2019, 09/2019 and booster 07/2020 Prevnar-13 2018, pneumovax-23 2019 Tdap - 08/2013  zostavax - 05/2012 Shingrix - discussed, will consider  Advanced directives: received and scanned into chart. Husband Jaquelyn Bitter then son Gaspar Bidding are HCPOA. Does not want life prolonging measures if terminal condition.   Seat belt use discussed. Sunscreen use discussed.  No changing moles on skin. Sees dermatologist.  Non smoker Alcohol - rare Dentist - q4 mo for periodontal disease  Eye exam Q1-2 yrs Bowel - no constipation Bladder - occasional urge incontinence s/p urethral sling   Lives with husband, married, 1 son Had been living in [place], return to [facility] fall 2014 Occupation: Retired, prior worked at [employer]: [employer] Activity: plays with dog, outdoor walking, joined gym through [employer] - restarted Y 10/2019 Diet: good water, fruits/vegetables daily     Relevant past medical, surgical, family and social history reviewed and updated as indicated. Interim medical history since our last visit reviewed. Allergies and medications reviewed and updated. Outpatient Medications Prior to Visit  Medication Sig Dispense Refill   LORazepam (ATIVAN) 1 MG tablet TAKE 1 TABLET BY MOUTH TWICE A DAY AS NEEDED 60 tablet 0   traMADol (ULTRAM) 50 MG tablet TAKE 1/2 TO 1 TABLET BY MOUTH 2 TIMES DAILY AS NEEDED FOR UP TO 5 DAYS 20 tablet 0   celecoxib (CELEBREX) 200 MG capsule TAKE ONE CAPSULE BY MOUTH DAILY 90 capsule 1   vitamin B-12 (CYANOCOBALAMIN) 500 MCG tablet Take 500 mcg by mouth daily. (Patient not taking: Reported on 01/21/2021)     AMBULATORY NON FORMULARY MEDICATION 1 Dose 3 times/day as needed-between meals & bedtime.  Medication Name: cbd 50mg  full spectrum oil (Patient not taking: Reported on 01/21/2021)     Biotin 5000 MCG CAPS Take 1 capsule by mouth daily. (Patient not taking: Reported on 01/21/2021)     budesonide (ENTOCORT EC) 3 MG 24 hr capsule Take 3 capsules (9 mg total) by mouth daily. (Patient not taking: Reported on 01/21/2021) 90 capsule 1   Melatonin 5 MG CAPS Take 1 capsule by mouth daily. (Patient not taking: Reported on 01/21/2021)     Probiotic Product (PROBIOTIC PO) Take by mouth daily. (Patient not taking: Reported on 01/21/2021)     No  facility-administered medications prior to visit.     Per HPI unless specifically indicated in ROS section below Review of Systems  Constitutional:  Negative for activity change, appetite change, chills, fatigue, fever and unexpected weight change.  HENT:  Negative for hearing loss.   Eyes:  Negative for visual disturbance.  Respiratory:  Negative for cough, chest tightness, shortness of breath and wheezing.   Cardiovascular:  Negative for chest pain, palpitations and leg swelling.  Gastrointestinal:  Negative for abdominal distention, abdominal pain, blood in stool, constipation, diarrhea, nausea and vomiting.  Genitourinary:  Negative for difficulty urinating and hematuria.  Musculoskeletal:  Positive for arthralgias. Negative for myalgias and neck pain.  Skin:  Negative for rash.  Neurological:  Positive for headaches (rare). Negative for dizziness, seizures and syncope.  Hematological:  Negative for adenopathy. Bruises/bleeds easily.  Psychiatric/Behavioral:  Negative for dysphoric mood. The patient is not nervous/anxious.    Objective:  BP 122/70   Pulse 82   Temp (!) 97.2 F (36.2 C) (Temporal)   Ht 5' 5.25" (1.657 m)   Wt 163 lb 4.8 oz (74.1 kg)   SpO2 99%   BMI 26.97 kg/m   Wt Readings from Last 3 Encounters:  01/21/21 163 lb 4.8 oz (74.1 kg)  06/06/20 166 lb (75.3 kg)  01/15/20 162 lb 2 oz (73.5 kg)      Physical Exam Vitals and nursing note reviewed.  Constitutional:      Appearance: Normal appearance. She is not ill-appearing.  HENT:     Head: Normocephalic and atraumatic.     Right Ear: Tympanic membrane, ear canal and external ear normal. There is no impacted cerumen.     Left Ear: Tympanic membrane, ear canal and external ear normal. There is no impacted cerumen.  Eyes:     General:        Right eye: No discharge.        Left eye: No discharge.     Extraocular Movements: Extraocular movements intact.     Conjunctiva/sclera: Conjunctivae normal.      Pupils: Pupils are equal, round, and reactive to light.  Neck:     Thyroid: No thyroid mass or thyromegaly.     Vascular: No carotid bruit.  Cardiovascular:     Rate and Rhythm: Normal rate and regular rhythm.     Pulses: Normal pulses.     Heart sounds: Normal heart sounds. No murmur heard. Pulmonary:     Effort: Pulmonary effort is normal. No respiratory distress.     Breath sounds: Normal breath sounds. No wheezing, rhonchi or rales.  Abdominal:     General: Bowel sounds are normal. There is no distension.     Palpations: Abdomen is soft. There is no mass.     Tenderness: There is no abdominal tenderness. There is no guarding or rebound.     Hernia: No hernia is present.  Musculoskeletal:     Cervical back: Normal range of motion and neck supple. No rigidity.     Right lower leg: No edema.     Left lower leg: No edema.  Lymphadenopathy:     Cervical: No cervical adenopathy.  Skin:    General: Skin is warm and dry.     Findings: No rash.  Neurological:     General: No focal deficit present.     Mental Status: She is alert. Mental status is at baseline.     Comments:  Recall 3/3 Calculation 5/5 DLROW  Psychiatric:        Mood and Affect: Mood normal.        Behavior: Behavior normal.      Results for orders placed or performed in visit on 01/15/21  VITAMIN D 25 Hydroxy (Vit-D Deficiency, Fractures)  Result Value Ref Range   VITD 31.64 30.00 - 100.00 ng/mL  Comprehensive metabolic panel  Result Value Ref Range   Sodium 140 135 - 145 mEq/L   Potassium 4.6 3.5 - 5.1 mEq/L   Chloride 104 96 - 112 mEq/L   CO2 30 19 - 32 mEq/L   Glucose, Bld 89 70 - 99 mg/dL   BUN 17 6 - 23 mg/dL   Creatinine, Ser 1.02 0.40 - 1.20 mg/dL   Total Bilirubin 0.8 0.2 - 1.2 mg/dL   Alkaline Phosphatase 69 39 - 117 U/L   AST 16 0 - 37 U/L   ALT 15 0 - 35 U/L   Total Protein 7.1 6.0 - 8.3 g/dL   Albumin 4.2 3.5 - 5.2 g/dL   GFR 56.21 (L) >60.00 mL/min   Calcium 9.7 8.4 - 10.5 mg/dL  TSH   Result Value Ref Range   TSH 2.21 0.35 - 5.50 uIU/mL  CBC with Differential/Platelet  Result Value Ref Range   WBC 5.2 4.0 - 10.5 K/uL   RBC 4.02 3.87 - 5.11 Mil/uL   Hemoglobin 12.7 12.0 - 15.0 g/dL   HCT 36.4 36.0 - 46.0 %   MCV 90.5 78.0 - 100.0 fl   MCHC 34.8 30.0 - 36.0 g/dL   RDW 13.5 11.5 - 15.5 %   Platelets 288.0 150.0 - 400.0 K/uL   Neutrophils Relative % 60.1 43.0 - 77.0 %   Lymphocytes Relative 29.7 12.0 - 46.0 %   Monocytes Relative 7.9 3.0 - 12.0 %   Eosinophils Relative 1.6 0.0 - 5.0 %   Basophils Relative 0.7 0.0 - 3.0 %   Neutro Abs 3.1 1.4 - 7.7 K/uL   Lymphs Abs 1.5 0.7 - 4.0 K/uL   Monocytes Absolute 0.4 0.1 - 1.0 K/uL   Eosinophils Absolute 0.1 0.0 - 0.7 K/uL   Basophils Absolute 0.0 0.0 - 0.1 K/uL  Lipid panel  Result Value Ref Range   Cholesterol 221 (H) 0 - 200 mg/dL   Triglycerides 129.0 0.0 - 149.0 mg/dL   HDL 56.00 >39.00 mg/dL   VLDL 25.8 0.0 - 40.0 mg/dL   LDL Cholesterol 139 (H) 0 - 99 mg/dL   Total CHOL/HDL Ratio 4    NonHDL 164.75    Depression screen [facility] 2/9 01/21/2021 01/15/2020 01/13/2019 10/13/2017 10/13/2017  Decreased Interest 0 0 0 0 0  Down, Depressed, Hopeless 0 0 0 - 0  PHQ - 2 Score 0 0 0 0 0  Altered sleeping 2 0 - - -  Tired, decreased energy 0 3 - - -  Change in appetite 1 3 - - -  Feeling bad or  failure about yourself  0 2 - - -  Trouble concentrating 0 0 - - -  Moving slowly or fidgety/restless 0 0 - - -  Suicidal thoughts 0 0 - - -  PHQ-9 Score 3 8 - - -  Difficult doing work/chores - - - - -    GAD 7 : Generalized Anxiety Score 01/21/2021 01/15/2020 10/04/2015  Nervous, Anxious, on Edge 0 0 3  Control/stop worrying 0 0 3  Worry too much - different things 0 0 3  Trouble relaxing 0 2 3  Restless 0 1 3  Easily annoyed or irritable 0 0 3  Afraid - awful might happen 0 1 2  Total GAD 7 Score 0 4 20   Assessment & Plan:  This visit occurred during the SARS-CoV-2 public health emergency.  Safety protocols were in place,  including screening questions prior to the visit, additional usage of staff PPE, and extensive cleaning of exam room while observing appropriate contact time as indicated for disinfecting solutions.   Problem List Items Addressed This Visit     Health maintenance examination (Chronic)    Preventative protocols reviewed and updated unless pt declined. Discussed healthy diet and lifestyle.        Medicare annual wellness visit, subsequent - Primary (Chronic)    I have personally reviewed the Medicare Annual Wellness questionnaire and have noted 1. The patient's medical and social history 2. Their use of alcohol, tobacco or illicit drugs 3. Their current medications and supplements 4. The patient's functional ability including ADL's, fall risks, home safety risks and hearing or visual impairment. Cognitive function has been assessed and addressed as indicated.  5. Diet and physical activity 6. Evidence for depression or mood disorders The patients weight, height, BMI have been recorded in the chart. I have made referrals, counseling and provided education to the patient based on review of the above and I have provided the pt with a written personalized care plan for preventive services. Provider list updated.. See scanned questionairre as needed for further documentation. Reviewed preventative protocols and updated unless pt declined.       Osteoarthritis    Continues celebrex 200mg  daily with benefit.        Relevant Medications   celecoxib (CELEBREX) 200 MG capsule   HLD (hyperlipidemia)    Reviewed readings. Encouraged healthy diet choices to improve cholesterol levels.  The 10-year ASCVD risk score Mikey Bussing DC Brooke Bonito., et al., 2013) is: 8%   Values used to calculate the score:     Age: 6 years     Sex: Female     Is Non-Hispanic African American: No     Diabetic: No     Tobacco smoker: No     Systolic Blood Pressure: 559 mmHg     Is BP treated: No     HDL Cholesterol: 56 mg/dL      Total Cholesterol: 221 mg/dL        Chronic insomnia    Continues PRN lorazepam 0.5-1mg  with benefit.        CKD (chronic kidney disease) stage 3, GFR 30-59 ml/min (HCC)    Improved readings this year - will continue to monitor.        Anemia, unspecified    This has resolved          Meds ordered this encounter  Medications   celecoxib (CELEBREX) 200 MG capsule    Sig: Take 1 capsule (200 mg total) by mouth daily.    Dispense:  90 capsule    Refill:  3    No orders of the defined types were placed in this encounter.  Patient instructions: If interested, check with pharmacy about new 2 shot shingles series (shingrix).  You are doing well today Continue current medicines Return as needed or in 1 year for next wellness visit/physical.   Follow up plan: Return in about 1 year (around 01/21/2022), or if symptoms worsen or fail to improve, for medicare wellness visit, annual exam, prior fasting for blood work.  Ria Bush, MD

## 2021-01-21 NOTE — Assessment & Plan Note (Signed)
Continues PRN lorazepam 0.5-1mg  with benefit.

## 2021-01-21 NOTE — Assessment & Plan Note (Signed)
Improved readings this year - will continue to monitor.

## 2021-01-21 NOTE — Assessment & Plan Note (Signed)
Preventative protocols reviewed and updated unless pt declined. Discussed healthy diet and lifestyle.  

## 2021-01-29 ENCOUNTER — Other Ambulatory Visit: Payer: Self-pay | Admitting: Family Medicine

## 2021-01-29 NOTE — Telephone Encounter (Signed)
Name of Medication: Lorazepam Name of [facility]acy: [facility] or Written Date and Quantity: 10/17/20 # 28 with 0 refill Last Office Visit and Type: 01/21/21 AWV Next Office Visit and Type: 01/28/22 AWV/CPE Last Controlled Substance Agreement Date: 10/08/2015 Last UDS: 10/08/2015

## 2021-01-30 NOTE — Telephone Encounter (Signed)
ERx 

## 2021-04-08 ENCOUNTER — Other Ambulatory Visit: Payer: Self-pay | Admitting: Family Medicine

## 2021-04-08 NOTE — Telephone Encounter (Signed)
Name of Medication: Lorazepam Name of Pharmacy: [facility] or Written Date and Quantity: 01/30/21, #60 Last Office Visit and Type: 01/21/21, AWV Next Office Visit and Type: 01/28/22, AWV prt 2 Last Controlled Substance Agreement Date: 10/08/15 Last UDS: 10/08/15

## 2021-04-09 NOTE — Telephone Encounter (Signed)
ERx 

## 2021-05-02 DIAGNOSIS — M25561 Pain in right knee: Secondary | ICD-10-CM | POA: Diagnosis not present

## 2021-05-02 DIAGNOSIS — M25551 Pain in right hip: Secondary | ICD-10-CM | POA: Diagnosis not present

## 2021-05-15 DIAGNOSIS — M6281 Muscle weakness (generalized): Secondary | ICD-10-CM | POA: Diagnosis not present

## 2021-05-15 DIAGNOSIS — S76011D Strain of muscle, fascia and tendon of right hip, subsequent encounter: Secondary | ICD-10-CM | POA: Diagnosis not present

## 2021-05-15 DIAGNOSIS — M48062 Spinal stenosis, lumbar region with neurogenic claudication: Secondary | ICD-10-CM | POA: Diagnosis not present

## 2021-05-22 DIAGNOSIS — M48062 Spinal stenosis, lumbar region with neurogenic claudication: Secondary | ICD-10-CM | POA: Diagnosis not present

## 2021-05-22 DIAGNOSIS — M6281 Muscle weakness (generalized): Secondary | ICD-10-CM | POA: Diagnosis not present

## 2021-05-22 DIAGNOSIS — S76011D Strain of muscle, fascia and tendon of right hip, subsequent encounter: Secondary | ICD-10-CM | POA: Diagnosis not present

## 2021-05-28 DIAGNOSIS — S76011D Strain of muscle, fascia and tendon of right hip, subsequent encounter: Secondary | ICD-10-CM | POA: Diagnosis not present

## 2021-05-28 DIAGNOSIS — M48062 Spinal stenosis, lumbar region with neurogenic claudication: Secondary | ICD-10-CM | POA: Diagnosis not present

## 2021-05-28 DIAGNOSIS — M6281 Muscle weakness (generalized): Secondary | ICD-10-CM | POA: Diagnosis not present

## 2021-06-06 DIAGNOSIS — M48062 Spinal stenosis, lumbar region with neurogenic claudication: Secondary | ICD-10-CM | POA: Diagnosis not present

## 2021-06-06 DIAGNOSIS — S76011D Strain of muscle, fascia and tendon of right hip, subsequent encounter: Secondary | ICD-10-CM | POA: Diagnosis not present

## 2021-06-06 DIAGNOSIS — M6281 Muscle weakness (generalized): Secondary | ICD-10-CM | POA: Diagnosis not present

## 2021-06-06 DIAGNOSIS — M25561 Pain in right knee: Secondary | ICD-10-CM | POA: Diagnosis not present

## 2021-06-14 DIAGNOSIS — M25561 Pain in right knee: Secondary | ICD-10-CM | POA: Diagnosis not present

## 2021-06-16 DIAGNOSIS — M25561 Pain in right knee: Secondary | ICD-10-CM | POA: Diagnosis not present

## 2021-07-06 HISTORY — PX: MENISCUS REPAIR: SHX5179

## 2021-07-11 ENCOUNTER — Encounter: Payer: Self-pay | Admitting: Family Medicine

## 2021-07-15 ENCOUNTER — Other Ambulatory Visit: Payer: Self-pay

## 2021-07-15 ENCOUNTER — Ambulatory Visit (INDEPENDENT_AMBULATORY_CARE_PROVIDER_SITE_OTHER): Payer: PPO | Admitting: Family Medicine

## 2021-07-15 ENCOUNTER — Encounter: Payer: Self-pay | Admitting: Family Medicine

## 2021-07-15 VITALS — BP 134/70 | HR 70 | Temp 97.7°F | Ht 65.25 in | Wt 160.4 lb

## 2021-07-15 DIAGNOSIS — M79604 Pain in right leg: Secondary | ICD-10-CM

## 2021-07-15 DIAGNOSIS — G4762 Sleep related leg cramps: Secondary | ICD-10-CM

## 2021-07-15 DIAGNOSIS — S83206A Unspecified tear of unspecified meniscus, current injury, right knee, initial encounter: Secondary | ICD-10-CM | POA: Insufficient documentation

## 2021-07-15 LAB — VITAMIN D 25 HYDROXY (VIT D DEFICIENCY, FRACTURES): VITD: 40.1 ng/mL (ref 30.00–100.00)

## 2021-07-15 LAB — BASIC METABOLIC PANEL
BUN: 27 mg/dL — ABNORMAL HIGH (ref 6–23)
CO2: 26 mEq/L (ref 19–32)
Calcium: 9.8 mg/dL (ref 8.4–10.5)
Chloride: 101 mEq/L (ref 96–112)
Creatinine, Ser: 1.09 mg/dL (ref 0.40–1.20)
GFR: 51.72 mL/min — ABNORMAL LOW (ref >60.00)
Glucose, Bld: 79 mg/dL (ref 70–99)
Potassium: 4 mEq/L (ref 3.5–5.1)
Sodium: 136 mEq/L (ref 135–145)

## 2021-07-15 LAB — MAGNESIUM: Magnesium: 2 mg/dL (ref 1.5–2.5)

## 2021-07-15 MED ORDER — VITAMIN D3 25 MCG (1000 UT) PO CAPS: 1000.0000 [IU] | ORAL_CAPSULE | Freq: Every day | ORAL | Status: AC

## 2021-07-15 NOTE — Progress Notes (Signed)
Patient ID: Shannon Kelly, female    DOB: 1951/01/10, 71 y.o.   MRN: [id number]  This visit was conducted in person.  BP 134/70    Pulse 70    Temp 97.7 F (36.5 C) (Temporal)    Ht 5' 5.25" (1.657 m)    Wt 160 lb 7 oz (72.8 kg)    SpO2 97%    BMI 26.49 kg/m    CC: severe leg cramping Subjective:   HPI: Shannon Kelly is a 71 y.o. female presenting on 07/15/2021 for Leg Pain (C/o severe leg cramps in both legs, especially at night.  Started about 2 mos ago. )   2 mo h/o R>L leg cramping worse at night time. This is despite trying magnesium 250mg  daily (actually worsened cramping), vitamin D3 1000 IU daily, vitamin B12 5000 mcg daily.  Last week woke up with severe cramp associated with diaphoresis - she felt like she would pass out from pain.  No RLS symptoms.  Mother had bad leg cramps as well.   She continues celebrex daily, no recent tramadol use.   Seeing ortho for R leg and knee pain she initially thought sciatic pain. Saw Dr Mardelle Matte, s/p R knee MRI showing complex tear of lateral meniscal posterior horn, mild muscle edema of lateral head of gastrocnemius suggesting muscle strain, and mild tricompartmental osteoarthritis.  Has been restricting activity/exercise which has helped.  Has recently restarted going to gym.   She's stopped lorazepam.      Relevant past medical, surgical, family and social history reviewed and updated as indicated. Interim medical history since our last visit reviewed. Allergies and medications reviewed and updated. Outpatient Medications Prior to Visit  Medication Sig Dispense Refill   celecoxib (CELEBREX) 200 MG capsule Take 1 capsule (200 mg total) by mouth daily. 90 capsule 3   traMADol (ULTRAM) 50 MG tablet TAKE 1/2 TO 1 TABLET BY MOUTH 2 TIMES DAILY AS NEEDED FOR UP TO 5 DAYS 20 tablet 0   LORazepam (ATIVAN) 1 MG tablet TAKE 1 TABLET BY MOUTH TWICE A DAY AS NEEDED 60 tablet 0   vitamin B-12 (CYANOCOBALAMIN) 500 MCG tablet Take 500 mcg by  mouth daily. (Patient not taking: Reported on 01/21/2021)     No facility-administered medications prior to visit.     Per HPI unless specifically indicated in ROS section below Review of Systems  Objective:  BP 134/70    Pulse 70    Temp 97.7 F (36.5 C) (Temporal)    Ht 5' 5.25" (1.657 m)    Wt 160 lb 7 oz (72.8 kg)    SpO2 97%    BMI 26.49 kg/m   Wt Readings from Last 3 Encounters:  07/15/21 160 lb 7 oz (72.8 kg)  01/21/21 163 lb 4.8 oz (74.1 kg)  06/06/20 166 lb (75.3 kg)      Physical Exam Vitals and nursing note reviewed.  Constitutional:      Appearance: Normal appearance. She is not ill-appearing.  Musculoskeletal:        General: Normal range of motion.     Right lower leg: No edema.     Left lower leg: No edema.     Comments:  2+ DP bilaterally No pain with palpation of calves, no palpable cords FROM at ankles without ligament laxity   Skin:    General: Skin is warm and dry.     Findings: No rash.  Neurological:     Mental Status: She is alert.  Psychiatric:        Mood and Affect: Mood normal.        Behavior: Behavior normal.      Results for orders placed or performed in visit on 01/15/21  VITAMIN D 25 Hydroxy (Vit-D Deficiency, Fractures)  Result Value Ref Range   VITD 31.64 30.00 - 100.00 ng/mL  Comprehensive metabolic panel  Result Value Ref Range   Sodium 140 135 - 145 mEq/L   Potassium 4.6 3.5 - 5.1 mEq/L   Chloride 104 96 - 112 mEq/L   CO2 30 19 - 32 mEq/L   Glucose, Bld 89 70 - 99 mg/dL   BUN 17 6 - 23 mg/dL   Creatinine, Ser 1.02 0.40 - 1.20 mg/dL   Total Bilirubin 0.8 0.2 - 1.2 mg/dL   Alkaline Phosphatase 69 39 - 117 U/L   AST 16 0 - 37 U/L   ALT 15 0 - 35 U/L   Total Protein 7.1 6.0 - 8.3 g/dL   Albumin 4.2 3.5 - 5.2 g/dL   GFR 56.21 (L) >60.00 mL/min   Calcium 9.7 8.4 - 10.5 mg/dL  TSH  Result Value Ref Range   TSH 2.21 0.35 - 5.50 uIU/mL  CBC with Differential/Platelet  Result Value Ref Range   WBC 5.2 4.0 - 10.5 K/uL   RBC  4.02 3.87 - 5.11 Mil/uL   Hemoglobin 12.7 12.0 - 15.0 g/dL   HCT 36.4 36.0 - 46.0 %   MCV 90.5 78.0 - 100.0 fl   MCHC 34.8 30.0 - 36.0 g/dL   RDW 13.5 11.5 - 15.5 %   Platelets 288.0 150.0 - 400.0 K/uL   Neutrophils Relative % 60.1 43.0 - 77.0 %   Lymphocytes Relative 29.7 12.0 - 46.0 %   Monocytes Relative 7.9 3.0 - 12.0 %   Eosinophils Relative 1.6 0.0 - 5.0 %   Basophils Relative 0.7 0.0 - 3.0 %   Neutro Abs 3.1 1.4 - 7.7 K/uL   Lymphs Abs 1.5 0.7 - 4.0 K/uL   Monocytes Absolute 0.4 0.1 - 1.0 K/uL   Eosinophils Absolute 0.1 0.0 - 0.7 K/uL   Basophils Absolute 0.0 0.0 - 0.1 K/uL  Lipid panel  Result Value Ref Range   Cholesterol 221 (H) 0 - 200 mg/dL   Triglycerides 129.0 0.0 - 149.0 mg/dL   HDL 56.00 >39.00 mg/dL   VLDL 25.8 0.0 - 40.0 mg/dL   LDL Cholesterol 139 (H) 0 - 99 mg/dL   Total CHOL/HDL Ratio 4    NonHDL 164.75    Lab Results  Component Value Date   CKTOTAL 92 08/11/2013   Assessment & Plan:  This visit occurred during the SARS-CoV-2 public health emergency.  Safety protocols were in place, including screening questions prior to the visit, additional usage of staff PPE, and extensive cleaning of exam room while observing appropriate contact time as indicated for disinfecting solutions.   Problem List Items Addressed This Visit     Nocturnal leg cramps - Primary    Reassuring lower extremity exam today without evidence of arterial or venous insufficiency.  Reviewed supportive measures at home, handout provided.  Update labs including electrolytes, will determine need for replacement based on results.  She is not on statin or diuretic.       Relevant Orders   VITAMIN D 25 Hydroxy (Vit-D Deficiency, Fractures)   Basic metabolic panel   Magnesium   Right leg pain    Appreciate ortho care - found to have medial meniscal tear as  well as gastroc strain and mild tricompartment osteoarthritis, continues celebrex daily.         Meds ordered this encounter   Medications   Cholecalciferol (VITAMIN D3) 25 MCG (1000 UT) CAPS    Sig: Take 1 capsule (1,000 Units total) by mouth daily.    Dispense:  60 capsule   Orders Placed This Encounter  Procedures   VITAMIN D 25 Hydroxy (Vit-D Deficiency, Fractures)   Basic metabolic panel   Magnesium     Patient instructions: Stretch prior to bedtime.  Keep bed sheets untucked.  Continue drinking lots of water.  Labs today.   Follow up plan: Return if symptoms worsen or fail to improve.  Ria Bush, MD

## 2021-07-15 NOTE — Assessment & Plan Note (Signed)
Appreciate ortho care - found to have medial meniscal tear as well as gastroc strain and mild tricompartment osteoarthritis, continues celebrex daily.

## 2021-07-15 NOTE — Assessment & Plan Note (Addendum)
Reassuring lower extremity exam today without evidence of arterial or venous insufficiency.  Reviewed supportive measures at home, handout provided.  Update labs including electrolytes, will determine need for replacement based on results.  She is not on statin or diuretic.

## 2021-07-15 NOTE — Patient Instructions (Addendum)
Stretch prior to bedtime.  Keep bed sheets untucked.  Continue drinking lots of water.  Labs today.   Leg Cramps Leg cramps occur when one or more muscles tighten and a person has no control over it (involuntary muscle contraction). Muscle cramps are most common in the calf muscles of the leg. They can occur during exercise or at rest. Leg cramps are painful, and they may last for a few seconds to a few minutes. Cramps may return several times before they finally stop. Usually, leg cramps are not caused by a serious medical problem. In many cases, the cause is not known. Some common causes include: Excessive physical effort (overexertion), such as during intense exercise. Doing the same motion over and over. Staying in a certain position for a long period of time. Improper preparation, form, or technique while doing a sport or an activity. Dehydration. Injury. Side effects of certain medicines. Abnormally low levels of minerals in your blood (electrolytes), especially potassium and calcium. This could result from: Pregnancy. Taking diuretic medicines. Follow these instructions at home: Eating and drinking Drink enough fluid to keep your urine pale yellow. Staying hydrated may help prevent cramps. Eat a healthy diet that includes plenty of nutrients to help your muscles function. A healthy diet includes fruits and vegetables, lean protein, whole grains, and low-fat or nonfat dairy products. Managing pain, stiffness, and swelling   Try massaging, stretching, and relaxing the affected muscle. Do this for several minutes at a time. If directed, put ice on areas that are sore or painful after a cramp. To do this: Put ice in a plastic bag. Place a towel between your skin and the bag. Leave the ice on for 20 minutes, 2-3 times a day. Remove the ice if your skin turns bright red. This is very important. If you cannot feel pain, heat, or cold, you have a greater risk of damage to the area. If  directed, apply heat to muscles that are tense or tight. Do this before you exercise, or as often as told by your health care provider. Use the heat source that your health care provider recommends, such as a moist heat pack or a heating pad. To do this: Place a towel between your skin and the heat source. Leave the heat on for 20-30 minutes. Remove the heat if your skin turns bright red. This is especially important if you are unable to feel pain, heat, or cold. You may have a greater risk of getting burned. Try taking hot showers or baths to help relax tight muscles. General instructions If you are having frequent leg cramps, avoid intense exercise for several days. Take over-the-counter and prescription medicines only as told by your health care provider. Keep all follow-up visits. This is important. Contact a health care provider if: Your leg cramps get more severe or more frequent, or they do not improve over time. Your foot becomes cold, numb, or blue. Summary Muscle cramps can develop in any muscle, but the most common place is in the calf muscles of the leg. Leg cramps are painful, and they may last for a few seconds to a few minutes. Usually, leg cramps are not caused by a serious medical problem. Often, the cause is not known. Stay hydrated, and take over-the-counter and prescription medicines only as told by your health care provider. This information is not intended to replace advice given to you by your health care provider. Make sure you discuss any questions you have with your health care  provider. Document Revised: 11/08/2019 Document Reviewed: 11/08/2019 Elsevier Patient Education  [place].

## 2021-08-04 ENCOUNTER — Other Ambulatory Visit: Payer: Self-pay | Admitting: Family Medicine

## 2021-08-04 DIAGNOSIS — Z1231 Encounter for screening mammogram for malignant neoplasm of breast: Secondary | ICD-10-CM

## 2021-08-26 DIAGNOSIS — S83281A Other tear of lateral meniscus, current injury, right knee, initial encounter: Secondary | ICD-10-CM | POA: Diagnosis not present

## 2021-10-03 ENCOUNTER — Ambulatory Visit
Admission: RE | Admit: 2021-10-03 | Discharge: 2021-10-03 | Disposition: A | Discharge status: Home / Self Care | Payer: PPO | Source: Ambulatory Visit | Attending: Family Medicine | Admitting: Family Medicine

## 2021-10-03 DIAGNOSIS — Z1231 Encounter for screening mammogram for malignant neoplasm of breast: Secondary | ICD-10-CM

## 2021-10-07 DIAGNOSIS — S83281A Other tear of lateral meniscus, current injury, right knee, initial encounter: Secondary | ICD-10-CM | POA: Diagnosis not present

## 2021-10-08 ENCOUNTER — Other Ambulatory Visit: Payer: Self-pay | Admitting: Family Medicine

## 2021-10-08 DIAGNOSIS — N6310 Unspecified lump in the right breast, unspecified quadrant: Secondary | ICD-10-CM

## 2021-10-10 ENCOUNTER — Ambulatory Visit
Admission: RE | Admit: 2021-10-10 | Discharge: 2021-10-10 | Disposition: A | Discharge status: Home / Self Care | Payer: PPO | Source: Ambulatory Visit | Attending: Family Medicine | Admitting: Family Medicine

## 2021-10-10 DIAGNOSIS — R922 Inconclusive mammogram: Secondary | ICD-10-CM | POA: Diagnosis not present

## 2021-10-10 DIAGNOSIS — N6310 Unspecified lump in the right breast, unspecified quadrant: Secondary | ICD-10-CM

## 2021-10-24 DIAGNOSIS — M94261 Chondromalacia, right knee: Secondary | ICD-10-CM | POA: Diagnosis not present

## 2021-10-24 DIAGNOSIS — S83271A Complex tear of lateral meniscus, current injury, right knee, initial encounter: Secondary | ICD-10-CM | POA: Diagnosis not present

## 2021-10-24 DIAGNOSIS — X58XXXA Exposure to other specified factors, initial encounter: Secondary | ICD-10-CM | POA: Diagnosis not present

## 2021-10-24 DIAGNOSIS — M23303 Other meniscus derangements, unspecified medial meniscus, right knee: Secondary | ICD-10-CM | POA: Diagnosis not present

## 2021-10-24 DIAGNOSIS — Y999 Unspecified external cause status: Secondary | ICD-10-CM | POA: Diagnosis not present

## 2021-10-30 DIAGNOSIS — M25561 Pain in right knee: Secondary | ICD-10-CM | POA: Diagnosis not present

## 2021-10-30 DIAGNOSIS — M6281 Muscle weakness (generalized): Secondary | ICD-10-CM | POA: Diagnosis not present

## 2021-10-30 DIAGNOSIS — M25661 Stiffness of right knee, not elsewhere classified: Secondary | ICD-10-CM | POA: Diagnosis not present

## 2021-11-04 DIAGNOSIS — M6281 Muscle weakness (generalized): Secondary | ICD-10-CM | POA: Diagnosis not present

## 2021-11-04 DIAGNOSIS — M25561 Pain in right knee: Secondary | ICD-10-CM | POA: Diagnosis not present

## 2021-11-04 DIAGNOSIS — M25661 Stiffness of right knee, not elsewhere classified: Secondary | ICD-10-CM | POA: Diagnosis not present

## 2021-11-06 DIAGNOSIS — M25661 Stiffness of right knee, not elsewhere classified: Secondary | ICD-10-CM | POA: Diagnosis not present

## 2021-11-06 DIAGNOSIS — M25561 Pain in right knee: Secondary | ICD-10-CM | POA: Diagnosis not present

## 2021-11-06 DIAGNOSIS — M6281 Muscle weakness (generalized): Secondary | ICD-10-CM | POA: Diagnosis not present

## 2021-11-10 DIAGNOSIS — M6281 Muscle weakness (generalized): Secondary | ICD-10-CM | POA: Diagnosis not present

## 2021-11-10 DIAGNOSIS — M25561 Pain in right knee: Secondary | ICD-10-CM | POA: Diagnosis not present

## 2021-11-10 DIAGNOSIS — M25661 Stiffness of right knee, not elsewhere classified: Secondary | ICD-10-CM | POA: Diagnosis not present

## 2021-11-14 DIAGNOSIS — M25561 Pain in right knee: Secondary | ICD-10-CM | POA: Diagnosis not present

## 2021-11-14 DIAGNOSIS — M6281 Muscle weakness (generalized): Secondary | ICD-10-CM | POA: Diagnosis not present

## 2021-11-14 DIAGNOSIS — M25661 Stiffness of right knee, not elsewhere classified: Secondary | ICD-10-CM | POA: Diagnosis not present

## 2021-11-24 DIAGNOSIS — M6281 Muscle weakness (generalized): Secondary | ICD-10-CM | POA: Diagnosis not present

## 2021-11-24 DIAGNOSIS — M25561 Pain in right knee: Secondary | ICD-10-CM | POA: Diagnosis not present

## 2021-11-24 DIAGNOSIS — M25661 Stiffness of right knee, not elsewhere classified: Secondary | ICD-10-CM | POA: Diagnosis not present

## 2021-11-27 DIAGNOSIS — M25561 Pain in right knee: Secondary | ICD-10-CM | POA: Diagnosis not present

## 2021-11-27 DIAGNOSIS — M6281 Muscle weakness (generalized): Secondary | ICD-10-CM | POA: Diagnosis not present

## 2021-11-27 DIAGNOSIS — M25661 Stiffness of right knee, not elsewhere classified: Secondary | ICD-10-CM | POA: Diagnosis not present

## 2021-12-02 DIAGNOSIS — M6281 Muscle weakness (generalized): Secondary | ICD-10-CM | POA: Diagnosis not present

## 2021-12-02 DIAGNOSIS — M25661 Stiffness of right knee, not elsewhere classified: Secondary | ICD-10-CM | POA: Diagnosis not present

## 2021-12-02 DIAGNOSIS — M25561 Pain in right knee: Secondary | ICD-10-CM | POA: Diagnosis not present

## 2021-12-04 DIAGNOSIS — M6281 Muscle weakness (generalized): Secondary | ICD-10-CM | POA: Diagnosis not present

## 2021-12-04 DIAGNOSIS — M25561 Pain in right knee: Secondary | ICD-10-CM | POA: Diagnosis not present

## 2021-12-04 DIAGNOSIS — M25661 Stiffness of right knee, not elsewhere classified: Secondary | ICD-10-CM | POA: Diagnosis not present

## 2021-12-11 DIAGNOSIS — M25661 Stiffness of right knee, not elsewhere classified: Secondary | ICD-10-CM | POA: Diagnosis not present

## 2021-12-11 DIAGNOSIS — M25561 Pain in right knee: Secondary | ICD-10-CM | POA: Diagnosis not present

## 2021-12-11 DIAGNOSIS — M6281 Muscle weakness (generalized): Secondary | ICD-10-CM | POA: Diagnosis not present

## 2021-12-19 DIAGNOSIS — M25661 Stiffness of right knee, not elsewhere classified: Secondary | ICD-10-CM | POA: Diagnosis not present

## 2021-12-19 DIAGNOSIS — M25561 Pain in right knee: Secondary | ICD-10-CM | POA: Diagnosis not present

## 2021-12-19 DIAGNOSIS — M6281 Muscle weakness (generalized): Secondary | ICD-10-CM | POA: Diagnosis not present

## 2021-12-23 DIAGNOSIS — M25561 Pain in right knee: Secondary | ICD-10-CM | POA: Diagnosis not present

## 2021-12-23 DIAGNOSIS — M25661 Stiffness of right knee, not elsewhere classified: Secondary | ICD-10-CM | POA: Diagnosis not present

## 2021-12-23 DIAGNOSIS — M6281 Muscle weakness (generalized): Secondary | ICD-10-CM | POA: Diagnosis not present

## 2022-01-01 DIAGNOSIS — M25561 Pain in right knee: Secondary | ICD-10-CM | POA: Diagnosis not present

## 2022-01-01 DIAGNOSIS — Z9889 Other specified postprocedural states: Secondary | ICD-10-CM | POA: Diagnosis not present

## 2022-01-12 ENCOUNTER — Encounter: Payer: Self-pay | Admitting: Dermatology

## 2022-01-12 ENCOUNTER — Ambulatory Visit: Payer: PPO | Admitting: Dermatology

## 2022-01-12 DIAGNOSIS — D692 Other nonthrombocytopenic purpura: Secondary | ICD-10-CM

## 2022-01-12 DIAGNOSIS — L578 Other skin changes due to chronic exposure to nonionizing radiation: Secondary | ICD-10-CM

## 2022-01-12 DIAGNOSIS — D1800 Hemangioma unspecified site: Secondary | ICD-10-CM

## 2022-01-12 DIAGNOSIS — L821 Other seborrheic keratosis: Secondary | ICD-10-CM

## 2022-01-12 DIAGNOSIS — L814 Other melanin hyperpigmentation: Secondary | ICD-10-CM | POA: Diagnosis not present

## 2022-01-12 DIAGNOSIS — L820 Inflamed seborrheic keratosis: Secondary | ICD-10-CM

## 2022-01-12 DIAGNOSIS — D229 Melanocytic nevi, unspecified: Secondary | ICD-10-CM

## 2022-01-12 DIAGNOSIS — Z1283 Encounter for screening for malignant neoplasm of skin: Secondary | ICD-10-CM | POA: Diagnosis not present

## 2022-01-12 DIAGNOSIS — L650 Telogen effluvium: Secondary | ICD-10-CM | POA: Diagnosis not present

## 2022-01-12 NOTE — Progress Notes (Signed)
New Patient Visit  Subjective  Shannon Kelly is a 71 y.o. female who presents for the following: Annual Exam (Here for skin cancer screening. Full body. No hx of skin cancer or dysplastic nevi. Areas of concern today). The patient presents for Total-Body Skin Exam (TBSE) for skin cancer screening and mole check.  The patient has spots, moles and lesions to be evaluated, some may be new or changing and the patient has concerns that these could be cancer. Complains of hair loss.  Review of Systems: No other skin or systemic complaints except as noted in HPI or Assessment and Plan.  Objective  Well appearing patient in no apparent distress; mood and affect are within normal limits.  A full examination was performed including scalp, head, eyes, ears, nose, lips, neck, chest, axillae, abdomen, back, buttocks, bilateral upper extremities, bilateral lower extremities, hands, feet, fingers, toes, fingernails, and toenails. All findings within normal limits unless otherwise noted below.  mid chest x2 (2) Erythematous keratotic or waxy stuck-on papule or plaque.  Scalp Diffuse thinning of hair, positive hair pull test.    Assessment & Plan   Lentigines - Scattered tan macules - Due to sun exposure - Benign-appearing, observe - Recommend daily broad spectrum sunscreen SPF 30+ to sun-exposed areas, reapply every 2 hours as needed. - Call for any changes  Seborrheic Keratoses - Stuck-on, waxy, tan-brown papules and/or plaques  - Benign-appearing - Discussed benign etiology and prognosis. - Observe - Call for any changes  Melanocytic Nevi - Tan-brown and/or pink-flesh-colored symmetric macules and papules - Benign appearing on exam today - Observation - Call clinic for new or changing moles - Recommend daily use of broad spectrum spf 30+ sunscreen to sun-exposed areas.   Hemangiomas - Red papules - Discussed benign nature - Observe - Call for any changes  Actinic Damage -  Chronic condition, secondary to cumulative UV/sun exposure - diffuse scaly erythematous macules with underlying dyspigmentation - Recommend daily broad spectrum sunscreen SPF 30+ to sun-exposed areas, reapply every 2 hours as needed.  - Staying in the shade or wearing long sleeves, sun glasses (UVA+UVB protection) and wide brim hats (4-inch brim around the entire circumference of the hat) are also recommended for sun protection.  - Call for new or changing lesions.  Skin cancer screening performed today.  Purpura - Chronic; persistent and recurrent.  Treatable, but not curable. Hands and legs - Violaceous macules and patches - Benign - Related to trauma, age, sun damage and/or use of blood thinners, chronic use of topical and/or oral steroids - Observe - Can use OTC arnica containing moisturizer such as Dermend Bruise Formula if desired - Call for worsening or other concerns  Inflamed seborrheic keratosis (2) mid chest x2 Symptomatic, irritating, patient would like treated.  Destruction of lesion - mid chest x2 Complexity: simple   Destruction method: cryotherapy   Informed consent: discussed and consent obtained   Timeout:  patient name, date of birth, surgical site, and procedure verified Lesion destroyed using liquid nitrogen: Yes   Region frozen until ice ball extended beyond lesion: Yes   Outcome: patient tolerated procedure well with no complications   Post-procedure details: wound care instructions given   Additional details:  Prior to procedure, discussed risks of blister formation, small wound, skin dyspigmentation, or rare scar following cryotherapy. Recommend Vaseline ointment to treated areas while healing.  Telogen effluvium Scalp Advised stress, trauma, illness, surgery can all contribute to this condition.  Will usually normalize on its own over  time.  Biotin 2.5 mg qd. TSH normal 1 year ago.  Lab reviewed.  Pt to have lab later this month. Discussed other  treatment options. Pt declines. Telogen effluvium is a benign, self-limited condition causing increased hair shedding usually for several months. It does not progress to baldness, and the hair eventually grows back on its own. It can be triggered by recent illness, recent surgery, thyroid disease, low iron stores, vitamin D deficiency, fad diets or rapid weight loss, hormonal changes such as pregnancy or birth control pills, and some medication. Usually the hair loss starts 2-3 months after the illness or health change. Rarely, it can continue for longer than a year.  Return for TBSE in 1 - 2 years.  I, Emelia Salisbury, CMA, am acting as scribe for Sarina Ser, MD. Documentation: I have reviewed the above documentation for accuracy and completeness, and I agree with the above.  Sarina Ser, MD

## 2022-01-12 NOTE — Patient Instructions (Addendum)
Cryotherapy Aftercare  Wash gently with soap and water everyday.   Apply Vaseline daily until healed.    Recommend using CeraVe SA cream or Amlactin 12% cream.   Recommend using DerMend for bruising, or any arnica containing moisturizer.   Recommend using DermaNail for fingernails.  Continue Biotin as directed.      Telogen Effluvium  Telogen effluvium is a condition in which the body sheds much hair. People describe that they are losing hair "from the roots" in excessive amounts. The hair loss is usually 3 to 6 months following an event. The inciting event may be childbirth, a surgery, an illness with a fever, a traumatic psychological event, weight loss, or the start of a new medication. Some people have no known inciting event. For most people, no treatment is necessary, and the hair will stop falling out and begin to regrow with time. Some women develop a chronic form of telogen effluvium in which they continue to lose hair at an accelerated rate.  Telogen effluvium is a benign, self-limited condition causing increased hair shedding usually for several months. It does not progress to baldness, and the hair eventually grows back on its own. It can be triggered by recent illness, recent surgery, thyroid disease, low iron stores, vitamin D deficiency, fad diets or rapid weight loss, hormonal changes such as pregnancy or birth control pills, and some medication. Usually the hair loss starts 2-3 months after the illness or health change. Rarely, it can continue for longer than a year.     Recommend daily broad spectrum sunscreen SPF 30+ to sun-exposed areas, reapply every 2 hours as needed. Call for new or changing lesions.  Staying in the shade or wearing long sleeves, sun glasses (UVA+UVB protection) and wide brim hats (4-inch brim around the entire circumference of the hat) are also recommended for sun protection.    Gentle Skin Care Guide  1. Bathe no more than once a day.  2.  Avoid bathing in hot water  3. Use a mild soap like Dove, Vanicream, Cetaphil, CeraVe. Can use Lever 2000 or Cetaphil antibacterial soap  4. Use soap only where you need it. On most days, use it under your arms, between your legs, and on your feet. Let the water rinse other areas unless visibly dirty.  5. When you get out of the bath/shower, use a towel to gently blot your skin dry, don't rub it.  6. While your skin is still a little damp, apply a moisturizing cream such as Vanicream, CeraVe, Cetaphil, Eucerin, Sarna lotion or plain Vaseline Jelly. For hands apply Neutrogena [place] Hand Cream or Excipial Hand Cream.  7. Reapply moisturizer any time you start to itch or feel dry.  8. Sometimes using free and clear laundry detergents can be helpful. Fabric softener sheets should be avoided. Downy Free & Gentle liquid, or any liquid fabric softener that is free of dyes and perfumes, it acceptable to use  9. If your doctor has given you prescription creams you may apply moisturizers over them      Seborrheic Keratosis  What causes seborrheic keratoses? Seborrheic keratoses are harmless, common skin growths that first appear during adult life.  As time goes by, more growths appear.  Some people may develop a large number of them.  Seborrheic keratoses appear on both covered and uncovered body parts.  They are not caused by sunlight.  The tendency to develop seborrheic keratoses can be inherited.  They vary in color from skin-colored to gray, brown,  or even black.  They can be either smooth or have a rough, warty surface.   Seborrheic keratoses are superficial and look as if they were stuck on the skin.  Under the microscope this type of keratosis looks like layers upon layers of skin.  That is why at times the top layer may seem to fall off, but the rest of the growth remains and re-grows.    Treatment Seborrheic keratoses do not need to be treated, but can easily be removed in the office.   Seborrheic keratoses often cause symptoms when they rub on clothing or jewelry.  Lesions can be in the way of shaving.  If they become inflamed, they can cause itching, soreness, or burning.  Removal of a seborrheic keratosis can be accomplished by freezing, burning, or surgery. If any spot bleeds, scabs, or grows rapidly, please return to have it checked, as these can be an indication of a skin cancer.   Melanoma ABCDEs  Melanoma is the most dangerous type of skin cancer, and is the leading cause of death from skin disease.  You are more likely to develop melanoma if you: Have light-colored skin, light-colored eyes, or red or blond hair Spend a lot of time in the sun Tan regularly, either outdoors or in a tanning bed Have had blistering sunburns, especially during childhood Have a close family member who has had a melanoma Have atypical moles or large birthmarks  Early detection of melanoma is key since treatment is typically straightforward and cure rates are extremely high if we catch it early.   The first sign of melanoma is often a change in a mole or a new dark spot.  The ABCDE system is a way of remembering the signs of melanoma.  A for asymmetry:  The two halves do not match. B for border:  The edges of the growth are irregular. C for color:  A mixture of colors are present instead of an even brown color. D for diameter:  Melanomas are usually (but not always) greater than 77m - the size of a pencil eraser. E for evolution:  The spot keeps changing in size, shape, and color.  Please check your skin once per month between visits. You can use a small mirror in front and a large mirror behind you to keep an eye on the back side or your body.   If you see any new or changing lesions before your next follow-up, please call to schedule a visit.  Please continue daily skin protection including broad spectrum sunscreen SPF 30+ to sun-exposed areas, reapplying every 2 hours as needed when  you're outdoors.   Staying in the shade or wearing long sleeves, sun glasses (UVA+UVB protection) and wide brim hats (4-inch brim around the entire circumference of the hat) are also recommended for sun protection.     Due to recent changes in healthcare laws, you may see results of your pathology and/or laboratory studies on MyChart before the doctors have had a chance to review them. We understand that in some cases there may be results that are confusing or concerning to you. Please understand that not all results are received at the same time and often the doctors may need to interpret multiple results in order to provide you with the best plan of care or course of treatment. Therefore, we ask that you please give u[place]2 business days to thoroughly review all your results before contacting the office for clarification. Should we see a critical  lab result, you will be contacted sooner.   If You Need Anything After Your Visit  If you have any questions or concerns for your doctor, please call our main line at [phone] and press option 4 to reach your doctor's medical assistant. If no one answers, please leave a voicemail as directed and we will return your call as soon as possible. Messages left after 4 pm will be answered the following business day.   You may also send [place] a message via [place]. We typically respond to MyChart messages within 1-2 business days.  For prescription refills, please ask your pharmacy to contact our office. Our fax number is [phone].  If you have an urgent issue when the clinic is closed that cannot wait until the next business day, you can page your doctor at the number below.    Please note that while we do our best to be available for urgent issues outside of office hours, we are not available 24/7.   If you have an urgent issue and are unable to reach [place], you may choose to seek medical care at your doctor's office, retail clinic, urgent care center, or  emergency room.  If you have a medical emergency, please immediately call 911 or go to the emergency department.  Pager Numbers  - Dr. Nehemiah Massed: [phone]  - Dr. Laurence Ferrari: [phone]  - Dr. Nicole Kindred: [phone]  In the event of inclement weather, please call our main line at [phone] for an update on the status of any delays or closures.  Dermatology Medication Tips: Please keep the boxes that topical medications come in in order to help keep track of the instructions about where and how to use these. Pharmacies typically print the medication instructions only on the boxes and not directly on the medication tubes.   If your medication is too expensive, please contact our office at [phone] option 4 or send [place] a message through [place].   We are unable to tell what your co-pay for medications will be in advance as this is different depending on your insurance coverage. However, we may be able to find a substitute medication at lower cost or fill out paperwork to get insurance to cover a needed medication.   If a prior authorization is required to get your medication covered by your insurance company, please allow [place] 1-2 business days to complete this process.  Drug prices often vary depending on where the prescription is filled and some pharmacies may offer cheaper prices.  The website www.goodrx.com contains coupons for medications through different pharmacies. The prices here do not account for what the cost may be with help from insurance (it may be cheaper with your insurance), but the website can give you the price if you did not use any insurance.  - You can print the associated coupon and take it with your prescription to the pharmacy.  - You may also stop by our office during regular business hours and pick up a GoodRx coupon card.  - If you need your prescription sent electronically to a different pharmacy, notify our office through [facility] or by phone at  [phone] option 4.     Si Usted Necesita Algo Despus de Su Visita  Tambin puede enviarnos un mensaje a travs de Pharmacist, community. Por lo general respondemos a los mensajes de MyChart en el transcurso de 1 a 2 das hbiles.  Para renovar recetas, por favor pida a su farmacia que se ponga en contacto con nuestra oficina.  Harland Dingwall de fax es [place] [phone].  Si tiene un asunto urgente cuando la clnica est cerrada y que no puede esperar hasta el siguiente da hbil, puede llamar/localizar a su doctor(a) al nmero que aparece a continuacin.   Por favor, tenga en cuenta que aunque hacemos todo lo posible para estar disponibles para asuntos urgentes fuera del horario de [place], no estamos disponibles las 24 horas del da, los 7 das de la [place].   Si tiene un problema urgente y no puede comunicarse con nosotros, puede optar por buscar atencin mdica  en el consultorio de su doctor(a), en una clnica privada, en un centro de atencin urgente o en una sala de emergencias.  Si tiene Engineering geologist, por favor llame inmediatamente al 911 o vaya a la sala de emergencias.  Nmeros de bper  - Dr. Nehemiah Massed: [phone]  - Dra. Moye: [phone]  - Dra. Nicole Kindred: [phone]  En caso de inclemencias del [place], por favor llame a Johnsie Kindred principal al [phone] para una actualizacin sobre el [place] de cualquier retraso o cierre.  Consejos para la medicacin en dermatologa: Por favor, guarde las cajas en las que vienen los medicamentos de uso tpico para ayudarle a seguir las instrucciones sobre dnde y cmo usarlos. Las farmacias generalmente imprimen las instrucciones del medicamento slo en las cajas y no directamente en los tubos del [place].   Si su medicamento es muy caro, por favor, pngase en contacto con Zigmund Daniel llamando al [phone] y presione la opcin 4 o envenos un mensaje a travs de Pharmacist, community.   No podemos decirle cul ser su copago por los  medicamentos por adelantado ya que esto es diferente dependiendo de la cobertura de su seguro. Sin embargo, es posible que podamos encontrar un medicamento sustituto a Electrical engineer un formulario para que el seguro cubra el medicamento que se considera necesario.   Si se requiere una autorizacin previa para que su compaa de seguros [place] su medicamento, por favor permtanos de 1 a 2 das hbiles para completar este proceso.  Los precios de los medicamentos varan con frecuencia dependiendo del Environmental consultant de dnde se surte la receta y alguna farmacias pueden ofrecer precios ms baratos.  El sitio web www.goodrx.com tiene cupones para medicamentos de Airline pilot. Los precios aqu no tienen en cuenta lo que podra costar con la ayuda del seguro (puede ser ms barato con su seguro), pero el sitio web puede darle el precio si no utiliz Research scientist (physical sciences).  - Puede imprimir el cupn correspondiente y llevarlo con su receta a la farmacia.  - Tambin puede pasar por nuestra oficina durante el horario de atencin regular y Charity fundraiser una tarjeta de cupones de GoodRx.  - Si necesita que su receta se enve electrnicamente a una farmacia diferente, informe a nuestra oficina a travs de MyChart de [place] o por telfono llamando al [phone] y presione la opcin 4.

## 2022-01-18 ENCOUNTER — Other Ambulatory Visit: Payer: Self-pay | Admitting: Family Medicine

## 2022-01-18 DIAGNOSIS — R5383 Other fatigue: Secondary | ICD-10-CM

## 2022-01-18 DIAGNOSIS — E785 Hyperlipidemia, unspecified: Secondary | ICD-10-CM

## 2022-01-20 ENCOUNTER — Encounter: Payer: Self-pay | Admitting: Dermatology

## 2022-01-21 ENCOUNTER — Other Ambulatory Visit (INDEPENDENT_AMBULATORY_CARE_PROVIDER_SITE_OTHER): Payer: PPO

## 2022-01-21 DIAGNOSIS — E785 Hyperlipidemia, unspecified: Secondary | ICD-10-CM | POA: Diagnosis not present

## 2022-01-21 DIAGNOSIS — R5383 Other fatigue: Secondary | ICD-10-CM

## 2022-01-21 LAB — CBC WITH DIFFERENTIAL/PLATELET
Basophils Absolute: 0 10*3/uL (ref 0.0–0.1)
Basophils Relative: 0.6 % (ref 0.0–3.0)
Eosinophils Absolute: 0.1 10*3/uL (ref 0.0–0.7)
Eosinophils Relative: 3 % (ref 0.0–5.0)
HCT: 37.2 % (ref 36.0–46.0)
Hemoglobin: 12.6 g/dL (ref 12.0–15.0)
Lymphocytes Relative: 33.8 % (ref 12.0–46.0)
Lymphs Abs: 1.6 10*3/uL (ref 0.7–4.0)
MCHC: 33.9 g/dL (ref 30.0–36.0)
MCV: 92.1 fl (ref 78.0–100.0)
Monocytes Absolute: 0.4 10*3/uL (ref 0.1–1.0)
Monocytes Relative: 8.2 % (ref 3.0–12.0)
Neutro Abs: 2.5 10*3/uL (ref 1.4–7.7)
Neutrophils Relative %: 54.4 % (ref 43.0–77.0)
Platelets: 270 10*3/uL (ref 150.0–400.0)
RBC: 4.04 Mil/uL (ref 3.87–5.11)
RDW: 14 % (ref 11.5–15.5)
WBC: 4.7 10*3/uL (ref 4.0–10.5)

## 2022-01-21 LAB — COMPREHENSIVE METABOLIC PANEL
ALT: 18 U/L (ref 0–35)
AST: 18 U/L (ref 0–37)
Albumin: 4.4 g/dL (ref 3.5–5.2)
Alkaline Phosphatase: 57 U/L (ref 39–117)
BUN: 23 mg/dL (ref 6–23)
CO2: 28 mEq/L (ref 19–32)
Calcium: 9.4 mg/dL (ref 8.4–10.5)
Chloride: 105 mEq/L (ref 96–112)
Creatinine, Ser: 1.03 mg/dL (ref 0.40–1.20)
GFR: 55.16 mL/min — ABNORMAL LOW (ref >60.00)
Glucose, Bld: 84 mg/dL (ref 70–99)
Potassium: 4 mEq/L (ref 3.5–5.1)
Sodium: 141 mEq/L (ref 135–145)
Total Bilirubin: 0.8 mg/dL (ref 0.2–1.2)
Total Protein: 7.5 g/dL (ref 6.0–8.3)

## 2022-01-21 LAB — LIPID PANEL
Cholesterol: 240 mg/dL — ABNORMAL HIGH (ref 0–200)
HDL: 63.6 mg/dL (ref >39.00)
LDL Cholesterol: 154 mg/dL — ABNORMAL HIGH (ref 0–99)
NonHDL: 176.04
Total CHOL/HDL Ratio: 4
Triglycerides: 109 mg/dL (ref 0.0–149.0)
VLDL: 21.8 mg/dL (ref 0.0–40.0)

## 2022-01-21 LAB — MICROALBUMIN / CREATININE URINE RATIO
Creatinine,U: 83.5 mg/dL
Microalb Creat Ratio: 0.8 mg/g (ref 0.0–30.0)
Microalb, Ur: <0.7 mg/dL (ref 0.0–1.9)

## 2022-01-21 LAB — VITAMIN D 25 HYDROXY (VIT D DEFICIENCY, FRACTURES): VITD: 32.38 ng/mL (ref 30.00–100.00)

## 2022-01-22 LAB — PARATHYROID HORMONE, INTACT (NO CA): PTH: 31 pg/mL (ref 16–77)

## 2022-01-23 ENCOUNTER — Ambulatory Visit: Payer: PPO

## 2022-01-23 DIAGNOSIS — M25561 Pain in right knee: Secondary | ICD-10-CM | POA: Diagnosis not present

## 2022-01-26 ENCOUNTER — Ambulatory Visit: Payer: PPO

## 2022-01-27 DIAGNOSIS — S83281A Other tear of lateral meniscus, current injury, right knee, initial encounter: Secondary | ICD-10-CM | POA: Diagnosis not present

## 2022-01-28 ENCOUNTER — Ambulatory Visit (INDEPENDENT_AMBULATORY_CARE_PROVIDER_SITE_OTHER): Payer: PPO | Admitting: Family Medicine

## 2022-01-28 ENCOUNTER — Encounter: Payer: PPO | Admitting: Family Medicine

## 2022-01-28 ENCOUNTER — Encounter: Payer: Self-pay | Admitting: Family Medicine

## 2022-01-28 VITALS — BP 128/82 | HR 64 | Temp 98.0°F | Ht 65.0 in | Wt 157.4 lb

## 2022-01-28 DIAGNOSIS — N1831 Chronic kidney disease, stage 3a: Secondary | ICD-10-CM

## 2022-01-28 DIAGNOSIS — Z0000 Encounter for general adult medical examination without abnormal findings: Secondary | ICD-10-CM

## 2022-01-28 DIAGNOSIS — S83271S Complex tear of lateral meniscus, current injury, right knee, sequela: Secondary | ICD-10-CM

## 2022-01-28 DIAGNOSIS — M159 Polyosteoarthritis, unspecified: Secondary | ICD-10-CM

## 2022-01-28 DIAGNOSIS — F5104 Psychophysiologic insomnia: Secondary | ICD-10-CM

## 2022-01-28 DIAGNOSIS — E7800 Pure hypercholesterolemia, unspecified: Secondary | ICD-10-CM

## 2022-01-28 MED ORDER — ACETAMINOPHEN 500 MG PO TABS: 500.0000 mg | ORAL_TABLET | Freq: Four times a day (QID) | ORAL | 0 refills | Status: DC | PRN

## 2022-01-28 MED ORDER — TRAMADOL HCL 50 MG PO TABS: 25.0000 mg | ORAL_TABLET | Freq: Two times a day (BID) | ORAL | 0 refills | Status: DC | PRN

## 2022-01-28 NOTE — Assessment & Plan Note (Signed)
Preventative protocols reviewed and updated unless pt declined. Discussed healthy diet and lifestyle.  

## 2022-01-28 NOTE — Assessment & Plan Note (Signed)
Chronic, stable.  Recent GFR is maintaining in the 50s. She is aware of avoiding nephrotoxic agents such as NSAIDs.

## 2022-01-28 NOTE — Assessment & Plan Note (Addendum)
Avoiding NSAIDs due to CKD.  Discussed tylenol dosing.

## 2022-01-28 NOTE — Progress Notes (Signed)
Patient ID: [name], female    DOB: 1951/02/08, 71 y.o.   MRN: [id number]  This visit was conducted in person.  BP 128/82   Pulse 64   Temp 98 F (36.7 C) (Temporal)   Ht '5\' 5"'$  (1.651 m)   Wt 157 lb 6 oz (71.4 kg)   SpO2 98%   BMI 26.19 kg/m    CC: AMW Subjective:   HPI: Shannon Kelly is a 71 y.o. female presenting on 01/28/2022 for Medicare Wellness (Wants to discuss taking something in place of Aleve due to kidney health. )   Did not see health advisor this year.   Hearing Screening   '500Hz'$  '1000Hz'$  '2000Hz'$  '4000Hz'$   Right ear '20 20 20 25  '$ Left ear '20 20 20 '$ 40   Vision Screening   Right eye Left eye Both eyes  Without correction '20/25 20/25 20/25 '$  With correction       Flowsheet Row Office Visit from 01/28/2022 in [place] at [facility]  PHQ-2 Total Score 0          01/28/2022   10:00 AM 01/15/2020    9:25 AM 01/13/2019    2:18 PM 10/13/2017   11:42 AM 10/13/2017   11:32 AM  Fall Risk   Falls in the past year? 0 0 0 No No   Lymphocytic colitis - treated with budesonide course 06/2020 without much improvement - she actually found that melatonin caused this diarrhea.   Ongoing R knee issues after complex lateral meniscal tear s/p surgery 10/2021. Rpt MRI 01/2022 again shows complex tear of posterior horn of lateral meniscus. Discussing rpt surgery.   Worsening pain due to above. Aleve not helpful, desires to avoid this in CKD as well as celebrex.  Came off lorazepam a while ago.    Preventative: COLONOSCOPY 09/2018 - diverticulosis, rpt 5 yrs due to fmhx Carlean Purl) Well woman - with OBGYN prior in [place] (Dr. Wyvonne Lenz). S/p vaginal hysterectomy 1993 for fibroids, ovaries remain. Denies pelvic pain, pressure or vaginal bleeding, GYN skin changes.  [facility] - WNL. Good milk intake, leafy greens, regular weight bearing exercises.  Lung cancer screening - not eligible  Flu shot yearly COVID vaccine  - Pfizer 08/2019, 09/2019 and booster 07/2020, bivalent 04/2021 Prevnar-13 2018, pneumovax-23 2019 Tdap - 08/2013  zostavax - 05/2012 Shingrix - discussed, will check with pharmacy  Advanced directives: received and scanned into chart. Husband Jaquelyn Bitter then son Gaspar Bidding are HCPOA. Does not want life prolonging measures if terminal condition.  Seat belt use discussed. Sunscreen use discussed.  No changing moles on skin. Sees dermatologist. Non smoker Alcohol - rare Dentist - q4 mo for periodontal disease  Eye exam Q1-2 yrs Bowel - no constipation Bladder - occasional urge incontinence s/p urethral sling   Lives with husband, married, 1 son Had been living in [place], return to [facility] fall 2014 Occupation: Retired, prior worked at [employer]: [employer] Activity: plays with dog, outdoor walking, joined gym through [employer] - restarted Y 10/2019 Diet: good water, fruits/vegetables daily     Relevant past medical, surgical, family and social history reviewed and updated as indicated. Interim medical history since our last visit reviewed. Allergies and medications reviewed and updated. Outpatient Medications Prior to Visit  Medication Sig Dispense Refill   B Complex-Biotin-FA (B-100 B-COMPLEX) TABS      Biotin 5000 MCG CAPS 1 capsule Orally Once a day  Cholecalciferol (VITAMIN D3) 25 MCG (1000 UT) CAPS Take 1 capsule (1,000 Units total) by mouth daily. 60 capsule    naproxen sodium (ALEVE) 220 MG tablet Take 220 mg by mouth daily as needed.     vitamin B-12 (CYANOCOBALAMIN) 100 MCG tablet Orally     celecoxib (CELEBREX) 200 MG capsule Take 1 capsule (200 mg total) by mouth daily. (Patient not taking: Reported on 01/12/2022) 90 capsule 3   traMADol (ULTRAM) 50 MG tablet TAKE 1/2 TO 1 TABLET BY MOUTH 2 TIMES DAILY AS NEEDED FOR UP TO 5 DAYS (Patient not taking: Reported on 01/12/2022) 20 tablet 0   No facility-administered medications prior to visit.     Per HPI  unless specifically indicated in ROS section below Review of Systems  Constitutional:  Negative for activity change, appetite change, chills, fatigue, fever and unexpected weight change.  HENT:  Negative for hearing loss.   Eyes:  Negative for visual disturbance.  Respiratory:  Negative for cough, chest tightness, shortness of breath and wheezing.   Cardiovascular:  Negative for chest pain, palpitations and leg swelling.  Gastrointestinal:  Negative for abdominal distention, abdominal pain, blood in stool, constipation, diarrhea, nausea and vomiting.  Genitourinary:  Negative for difficulty urinating and hematuria.  Musculoskeletal:  Positive for arthralgias (R knee). Negative for myalgias and neck pain.  Skin:  Negative for rash.  Neurological:  Negative for dizziness, seizures, syncope and headaches.  Hematological:  Negative for adenopathy. Bruises/bleeds easily.  Psychiatric/Behavioral:  Negative for dysphoric mood. The patient is not nervous/anxious.     Objective:  BP 128/82   Pulse 64   Temp 98 F (36.7 C) (Temporal)   Ht '5\' 5"'$  (1.651 m)   Wt 157 lb 6 oz (71.4 kg)   SpO2 98%   BMI 26.19 kg/m   Wt Readings from Last 3 Encounters:  01/28/22 157 lb 6 oz (71.4 kg)  07/15/21 160 lb 7 oz (72.8 kg)  01/21/21 163 lb 4.8 oz (74.1 kg)      Physical Exam Vitals and nursing note reviewed.  Constitutional:      Appearance: Normal appearance. She is not ill-appearing.  HENT:     Head: Normocephalic and atraumatic.     Right Ear: Tympanic membrane, ear canal and external ear normal. There is no impacted cerumen.     Left Ear: Tympanic membrane, ear canal and external ear normal. There is no impacted cerumen.  Eyes:     General:        Right eye: No discharge.        Left eye: No discharge.     Extraocular Movements: Extraocular movements intact.     Conjunctiva/sclera: Conjunctivae normal.     Pupils: Pupils are equal, round, and reactive to light.  Neck:     Thyroid: No  thyroid mass or thyromegaly.     Vascular: No carotid bruit.  Cardiovascular:     Rate and Rhythm: Normal rate and regular rhythm.     Pulses: Normal pulses.     Heart sounds: Normal heart sounds. No murmur heard. Pulmonary:     Effort: Pulmonary effort is normal. No respiratory distress.     Breath sounds: Normal breath sounds. No wheezing, rhonchi or rales.  Abdominal:     General: Bowel sounds are normal. There is no distension.     Palpations: Abdomen is soft. There is no mass.     Tenderness: There is no abdominal tenderness. There is no guarding or rebound.  Hernia: No hernia is present.  Musculoskeletal:     Cervical back: Normal range of motion and neck supple. No rigidity.     Right lower leg: No edema.     Left lower leg: No edema.  Lymphadenopathy:     Cervical: No cervical adenopathy.  Skin:    General: Skin is warm and dry.     Findings: No rash.  Neurological:     General: No focal deficit present.     Mental Status: She is alert. Mental status is at baseline.     Comments:  Recall 3/3 Calculation 5/5 DLROW  Psychiatric:        Mood and Affect: Mood normal.        Behavior: Behavior normal.       Results for orders placed or performed in visit on 01/21/22  Parathyroid hormone, intact (no Ca)  Result Value Ref Range   PTH 31 16 - 77 pg/mL  VITAMIN D 25 Hydroxy (Vit-D Deficiency, Fractures)  Result Value Ref Range   VITD 32.38 30.00 - 100.00 ng/mL  Microalbumin / creatinine urine ratio  Result Value Ref Range   Microalb, Ur <0.7 0.0 - 1.9 mg/dL   Creatinine,U 83.5 mg/dL   Microalb Creat Ratio 0.8 0.0 - 30.0 mg/g  CBC with Differential/Platelet  Result Value Ref Range   WBC 4.7 4.0 - 10.5 K/uL   RBC 4.04 3.87 - 5.11 Mil/uL   Hemoglobin 12.6 12.0 - 15.0 g/dL   HCT 37.2 36.0 - 46.0 %   MCV 92.1 78.0 - 100.0 fl   MCHC 33.9 30.0 - 36.0 g/dL   RDW 14.0 11.5 - 15.5 %   Platelets 270.0 150.0 - 400.0 K/uL   Neutrophils Relative % 54.4 43.0 - 77.0 %    Lymphocytes Relative 33.8 12.0 - 46.0 %   Monocytes Relative 8.2 3.0 - 12.0 %   Eosinophils Relative 3.0 0.0 - 5.0 %   Basophils Relative 0.6 0.0 - 3.0 %   Neutro Abs 2.5 1.4 - 7.7 K/uL   Lymphs Abs 1.6 0.7 - 4.0 K/uL   Monocytes Absolute 0.4 0.1 - 1.0 K/uL   Eosinophils Absolute 0.1 0.0 - 0.7 K/uL   Basophils Absolute 0.0 0.0 - 0.1 K/uL  Lipid panel  Result Value Ref Range   Cholesterol 240 (H) 0 - 200 mg/dL   Triglycerides 109.0 0.0 - 149.0 mg/dL   HDL 63.60 >39.00 mg/dL   VLDL 21.8 0.0 - 40.0 mg/dL   LDL Cholesterol 154 (H) 0 - 99 mg/dL   Total CHOL/HDL Ratio 4    NonHDL 176.04   Comprehensive metabolic panel  Result Value Ref Range   Sodium 141 135 - 145 mEq/L   Potassium 4.0 3.5 - 5.1 mEq/L   Chloride 105 96 - 112 mEq/L   CO2 28 19 - 32 mEq/L   Glucose, Bld 84 70 - 99 mg/dL   BUN 23 6 - 23 mg/dL   Creatinine, Ser 1.03 0.40 - 1.20 mg/dL   Total Bilirubin 0.8 0.2 - 1.2 mg/dL   Alkaline Phosphatase 57 39 - 117 U/L   AST 18 0 - 37 U/L   ALT 18 0 - 35 U/L   Total Protein 7.5 6.0 - 8.3 g/dL   Albumin 4.4 3.5 - 5.2 g/dL   GFR 55.16 (L) >60.00 mL/min   Calcium 9.4 8.4 - 10.5 mg/dL    Assessment & Plan:   Problem List Items Addressed This Visit     Health maintenance examination (Chronic)  Preventative protocols reviewed and updated unless pt declined. Discussed healthy diet and lifestyle.       Medicare annual wellness visit, subsequent - Primary (Chronic)    I have personally reviewed the Medicare Annual Wellness questionnaire and have noted 1. The patient's medical and social history 2. Their use of alcohol, tobacco or illicit drugs 3. Their current medications and supplements 4. The patient's functional ability including ADL's, fall risks, home safety risks and hearing or visual impairment. Cognitive function has been assessed and addressed as indicated.  5. Diet and physical activity 6. Evidence for depression or mood disorders The patients weight, height, BMI  have been recorded in the chart. I have made referrals, counseling and provided education to the patient based on review of the above and I have provided the pt with a written personalized care plan for preventive services. Provider list updated.. See scanned questionairre as needed for further documentation. Reviewed preventative protocols and updated unless pt declined.       Osteoarthritis    Avoiding NSAIDs due to CKD.  Discussed tylenol dosing.      Relevant Medications   naproxen sodium (ALEVE) 220 MG tablet   traMADol (ULTRAM) 50 MG tablet   acetaminophen (TYLENOL) 500 MG tablet   HLD (hyperlipidemia)    Not on statin. Discussed diet choices to improve LDL control.  The 10-year ASCVD risk score (Arnett DK, et al., 2019) is: 9.7%   Values used to calculate the score:     Age: 61 years     Sex: Female     Is Non-Hispanic African American: No     Diabetic: No     Tobacco smoker: No     Systolic Blood Pressure: 448 mmHg     Is BP treated: No     HDL Cholesterol: 63.6 mg/dL     Total Cholesterol: 240 mg/dL       Chronic insomnia    Came off lorazepam last year.  Ongoing difficulty with sleep, currently due to ongoing knee pain.       CKD (chronic kidney disease) stage 3, GFR 30-59 ml/min (HCC)    Chronic, stable.  Recent GFR is maintaining in the 50s. She is aware of avoiding nephrotoxic agents such as NSAIDs.      Right knee meniscal tear    Ongoing issue since September 2022.  Status post meniscal repair April 2023. Ongoing pain, repeat MRI showing concern for persistent or recurrent meniscal tear, discussing repeat surgery in the near future.  Appreciate orthopedic care. Given ongoing pain discussed scheduling Tylenol (in CKD history) and using tramadol sparingly for breakthrough pain.         Meds ordered this encounter  Medications   traMADol (ULTRAM) 50 MG tablet    Sig: Take 0.5-1 tablets (25-50 mg total) by mouth 2 (two) times daily as needed.     Dispense:  20 tablet    Refill:  0   acetaminophen (TYLENOL) 500 MG tablet    Sig: Take 1 tablet (500 mg total) by mouth every 6 (six) hours as needed.    Dispense:  30 tablet    Refill:  0   No orders of the defined types were placed in this encounter.   Patient instructions: Schedule tylenol '500mg'$  1-2 tablets twice daily, may use tramadol 1/2-1 tablet as needed for breakthrough pain.  If interested, check with pharmacy about new 2 shot shingles series (shingrix).  Good to see you today.  Return as needed or in 1  year for next physical/wellness visit.   Follow up plan: Return in about 1 year (around 01/29/2023), or if symptoms worsen or fail to improve, for annual exam, prior fasting for blood work, medicare wellness visit.  Ria Bush, MD

## 2022-01-28 NOTE — Patient Instructions (Addendum)
Schedule tylenol '500mg'$  1-2 tablets twice daily, may use tramadol 1/2-1 tablet as needed for breakthrough pain.  If interested, check with pharmacy about new 2 shot shingles series (shingrix).  Good to see you today.  Return as needed or in 1 year for next physical/wellness visit.   Health Maintenance After Age 70 After age 98, you are at a higher risk for certain long-term diseases and infections as well as injuries from falls. Falls are a major cause of broken bones and head injuries in people who are older than age 60. Getting regular preventive care can help to keep you healthy and well. Preventive care includes getting regular testing and making lifestyle changes as recommended by your health care provider. Talk with your health care provider about: Which screenings and tests you should have. A screening is a test that checks for a disease when you have no symptoms. A diet and exercise plan that is right for you. What should I know about screenings and tests to prevent falls? Screening and testing are the best ways to find a health problem early. Early diagnosis and treatment give you the best chance of managing medical conditions that are common after age 45. Certain conditions and lifestyle choices may make you more likely to have a fall. Your health care provider may recommend: Regular vision checks. Poor vision and conditions such as cataracts can make you more likely to have a fall. If you wear glasses, make sure to get your prescription updated if your vision changes. Medicine review. Work with your health care provider to regularly review all of the medicines you are taking, including over-the-counter medicines. Ask your health care provider about any side effects that may make you more likely to have a fall. Tell your health care provider if any medicines that you take make you feel dizzy or sleepy. Strength and balance checks. Your health care provider may recommend certain tests to check  your strength and balance while standing, walking, or changing positions. Foot health exam. Foot pain and numbness, as well as not wearing proper footwear, can make you more likely to have a fall. Screenings, including: Osteoporosis screening. Osteoporosis is a condition that causes the bones to get weaker and break more easily. Blood pressure screening. Blood pressure changes and medicines to control blood pressure can make you feel dizzy. Depression screening. You may be more likely to have a fall if you have a fear of falling, feel depressed, or feel unable to do activities that you used to do. Alcohol use screening. Using too much alcohol can affect your balance and may make you more likely to have a fall. Follow these instructions at home: Lifestyle Do not drink alcohol if: Your health care provider tells you not to drink. If you drink alcohol: Limit how much you have to: 0-1 drink a day for women. 0-2 drinks a day for men. Know how much alcohol is in your drink. In the [place] one drink equals one 12 oz bottle of beer (355 mL), one 5 oz glass of wine (148 mL), or one 1 oz glass of hard liquor (44 mL). Do not use any products that contain nicotine or tobacco. These products include cigarettes, chewing tobacco, and vaping devices, such as e-cigarettes. If you need help quitting, ask your health care provider. Activity  Follow a regular exercise program to stay fit. This will help you maintain your balance. Ask your health care provider what types of exercise are appropriate for you. If you need  a cane or walker, use it as recommended by your health care provider. Wear supportive shoes that have nonskid soles. Safety  Remove any tripping hazards, such as rugs, cords, and clutter. Install safety equipment such as grab bars in bathrooms and safety rails on stairs. Keep rooms and walkways well-lit. General instructions Talk with your health care provider about your risks for falling. Tell  your health care provider if: You fall. Be sure to tell your health care provider about all falls, even ones that seem minor. You feel dizzy, tiredness (fatigue), or off-balance. Take over-the-counter and prescription medicines only as told by your health care provider. These include supplements. Eat a healthy diet and maintain a healthy weight. A healthy diet includes low-fat dairy products, low-fat (lean) meats, and fiber from whole grains, beans, and lots of fruits and vegetables. Stay current with your vaccines. Schedule regular health, dental, and eye exams. Summary Having a healthy lifestyle and getting preventive care can help to protect your health and wellness after age 12. Screening and testing are the best way to find a health problem early and help you avoid having a fall. Early diagnosis and treatment give you the best chance for managing medical conditions that are more common for people who are older than age 72. Falls are a major cause of broken bones and head injuries in people who are older than age 59. Take precautions to prevent a fall at home. Work with your health care provider to learn what changes you can make to improve your health and wellness and to prevent falls. This information is not intended to replace advice given to you by your health care provider. Make sure you discuss any questions you have with your health care provider. Document Revised: 11/11/2020 Document Reviewed: 11/11/2020 Elsevier Patient Education  [employer].

## 2022-01-28 NOTE — Assessment & Plan Note (Signed)
Ongoing issue since September 2022.  Status post meniscal repair April 2023. Ongoing pain, repeat MRI showing concern for persistent or recurrent meniscal tear, discussing repeat surgery in the near future.  Appreciate orthopedic care. Given ongoing pain discussed scheduling Tylenol (in CKD history) and using tramadol sparingly for breakthrough pain.

## 2022-01-28 NOTE — Assessment & Plan Note (Signed)
Came off lorazepam last year.  Ongoing difficulty with sleep, currently due to ongoing knee pain.

## 2022-01-28 NOTE — Assessment & Plan Note (Signed)

## 2022-01-28 NOTE — Assessment & Plan Note (Signed)
Not on statin. Discussed diet choices to improve LDL control.  The 10-year ASCVD risk score (Arnett DK, et al., 2019) is: 9.7%   Values used to calculate the score:     Age: 71 years     Sex: Female     Is Non-Hispanic African American: No     Diabetic: No     Tobacco smoker: No     Systolic Blood Pressure: 002 mmHg     Is BP treated: No     HDL Cholesterol: 63.6 mg/dL     Total Cholesterol: 240 mg/dL

## 2022-02-20 DIAGNOSIS — Y999 Unspecified external cause status: Secondary | ICD-10-CM | POA: Diagnosis not present

## 2022-02-20 DIAGNOSIS — S83271D Complex tear of lateral meniscus, current injury, right knee, subsequent encounter: Secondary | ICD-10-CM | POA: Diagnosis not present

## 2022-02-20 DIAGNOSIS — M94261 Chondromalacia, right knee: Secondary | ICD-10-CM | POA: Diagnosis not present

## 2022-02-20 DIAGNOSIS — G8918 Other acute postprocedural pain: Secondary | ICD-10-CM | POA: Diagnosis not present

## 2022-02-20 DIAGNOSIS — X58XXXA Exposure to other specified factors, initial encounter: Secondary | ICD-10-CM | POA: Diagnosis not present

## 2022-02-20 DIAGNOSIS — S83272A Complex tear of lateral meniscus, current injury, left knee, initial encounter: Secondary | ICD-10-CM | POA: Diagnosis not present

## 2022-02-26 DIAGNOSIS — S83281D Other tear of lateral meniscus, current injury, right knee, subsequent encounter: Secondary | ICD-10-CM | POA: Diagnosis not present

## 2022-02-26 DIAGNOSIS — M25661 Stiffness of right knee, not elsewhere classified: Secondary | ICD-10-CM | POA: Diagnosis not present

## 2022-03-03 DIAGNOSIS — S83281D Other tear of lateral meniscus, current injury, right knee, subsequent encounter: Secondary | ICD-10-CM | POA: Diagnosis not present

## 2022-03-03 DIAGNOSIS — M25661 Stiffness of right knee, not elsewhere classified: Secondary | ICD-10-CM | POA: Diagnosis not present

## 2022-03-05 DIAGNOSIS — S83281D Other tear of lateral meniscus, current injury, right knee, subsequent encounter: Secondary | ICD-10-CM | POA: Diagnosis not present

## 2022-03-05 DIAGNOSIS — M25661 Stiffness of right knee, not elsewhere classified: Secondary | ICD-10-CM | POA: Diagnosis not present

## 2022-03-11 DIAGNOSIS — M25661 Stiffness of right knee, not elsewhere classified: Secondary | ICD-10-CM | POA: Diagnosis not present

## 2022-03-11 DIAGNOSIS — S83281D Other tear of lateral meniscus, current injury, right knee, subsequent encounter: Secondary | ICD-10-CM | POA: Diagnosis not present

## 2022-03-17 DIAGNOSIS — M25661 Stiffness of right knee, not elsewhere classified: Secondary | ICD-10-CM | POA: Diagnosis not present

## 2022-03-17 DIAGNOSIS — S83281D Other tear of lateral meniscus, current injury, right knee, subsequent encounter: Secondary | ICD-10-CM | POA: Diagnosis not present

## 2022-03-20 DIAGNOSIS — S83281D Other tear of lateral meniscus, current injury, right knee, subsequent encounter: Secondary | ICD-10-CM | POA: Diagnosis not present

## 2022-03-20 DIAGNOSIS — M25661 Stiffness of right knee, not elsewhere classified: Secondary | ICD-10-CM | POA: Diagnosis not present

## 2022-03-24 DIAGNOSIS — M25661 Stiffness of right knee, not elsewhere classified: Secondary | ICD-10-CM | POA: Diagnosis not present

## 2022-03-24 DIAGNOSIS — S83281D Other tear of lateral meniscus, current injury, right knee, subsequent encounter: Secondary | ICD-10-CM | POA: Diagnosis not present

## 2022-03-26 DIAGNOSIS — S83281D Other tear of lateral meniscus, current injury, right knee, subsequent encounter: Secondary | ICD-10-CM | POA: Diagnosis not present

## 2022-03-26 DIAGNOSIS — M25661 Stiffness of right knee, not elsewhere classified: Secondary | ICD-10-CM | POA: Diagnosis not present

## 2022-03-31 DIAGNOSIS — S83281D Other tear of lateral meniscus, current injury, right knee, subsequent encounter: Secondary | ICD-10-CM | POA: Diagnosis not present

## 2022-03-31 DIAGNOSIS — M25661 Stiffness of right knee, not elsewhere classified: Secondary | ICD-10-CM | POA: Diagnosis not present

## 2022-04-02 DIAGNOSIS — S83281D Other tear of lateral meniscus, current injury, right knee, subsequent encounter: Secondary | ICD-10-CM | POA: Diagnosis not present

## 2022-04-02 DIAGNOSIS — M25661 Stiffness of right knee, not elsewhere classified: Secondary | ICD-10-CM | POA: Diagnosis not present

## 2022-04-07 DIAGNOSIS — M25661 Stiffness of right knee, not elsewhere classified: Secondary | ICD-10-CM | POA: Diagnosis not present

## 2022-04-07 DIAGNOSIS — S83281D Other tear of lateral meniscus, current injury, right knee, subsequent encounter: Secondary | ICD-10-CM | POA: Diagnosis not present

## 2022-04-09 DIAGNOSIS — M25661 Stiffness of right knee, not elsewhere classified: Secondary | ICD-10-CM | POA: Diagnosis not present

## 2022-04-09 DIAGNOSIS — S83281D Other tear of lateral meniscus, current injury, right knee, subsequent encounter: Secondary | ICD-10-CM | POA: Diagnosis not present

## 2022-04-14 DIAGNOSIS — S83281D Other tear of lateral meniscus, current injury, right knee, subsequent encounter: Secondary | ICD-10-CM | POA: Diagnosis not present

## 2022-04-14 DIAGNOSIS — M25661 Stiffness of right knee, not elsewhere classified: Secondary | ICD-10-CM | POA: Diagnosis not present

## 2022-04-16 DIAGNOSIS — S83281D Other tear of lateral meniscus, current injury, right knee, subsequent encounter: Secondary | ICD-10-CM | POA: Diagnosis not present

## 2022-04-16 DIAGNOSIS — M25661 Stiffness of right knee, not elsewhere classified: Secondary | ICD-10-CM | POA: Diagnosis not present

## 2022-04-20 DIAGNOSIS — M25661 Stiffness of right knee, not elsewhere classified: Secondary | ICD-10-CM | POA: Diagnosis not present

## 2022-04-20 DIAGNOSIS — S83281D Other tear of lateral meniscus, current injury, right knee, subsequent encounter: Secondary | ICD-10-CM | POA: Diagnosis not present

## 2022-05-14 DIAGNOSIS — J301 Allergic rhinitis due to pollen: Secondary | ICD-10-CM | POA: Diagnosis not present

## 2022-08-11 ENCOUNTER — Other Ambulatory Visit: Payer: Self-pay | Admitting: Family Medicine

## 2022-08-11 DIAGNOSIS — Z1231 Encounter for screening mammogram for malignant neoplasm of breast: Secondary | ICD-10-CM

## 2022-10-12 ENCOUNTER — Ambulatory Visit
Admission: RE | Admit: 2022-10-12 | Discharge: 2022-10-12 | Disposition: A | Discharge status: Home / Self Care | Payer: PPO | Source: Ambulatory Visit | Attending: Family Medicine | Admitting: Family Medicine

## 2022-10-12 DIAGNOSIS — Z1231 Encounter for screening mammogram for malignant neoplasm of breast: Secondary | ICD-10-CM

## 2022-10-27 DIAGNOSIS — M25561 Pain in right knee: Secondary | ICD-10-CM | POA: Diagnosis not present

## 2023-01-21 ENCOUNTER — Ambulatory Visit: Payer: PPO

## 2023-01-21 VITALS — Ht 65.5 in | Wt 165.0 lb

## 2023-01-21 DIAGNOSIS — Z0000 Encounter for general adult medical examination without abnormal findings: Secondary | ICD-10-CM | POA: Diagnosis not present

## 2023-01-21 DIAGNOSIS — Z780 Asymptomatic menopausal state: Secondary | ICD-10-CM

## 2023-01-21 NOTE — Progress Notes (Signed)
Subjective:   Shannon Kelly is a 72 y.o. female who presents for Medicare Annual (Subsequent) preventive examination.  Visit Complete: Virtual  I connected with  Maisie Fus on 01/21/23 by a audio enabled telemedicine application and verified that I am speaking with the correct person using two identifiers.  Patient Location: Home  Provider Location: Office/Clinic  I discussed the limitations of evaluation and management by telemedicine. The patient expressed understanding and agreed to proceed.   Review of Systems      Cardiac Risk Factors include: advanced age (>35men, >5 women);dyslipidemia  Per patient no change in vitals since last visit, unable to obtain new vitals due to telehealth visit     Objective:    Today's Vitals   01/21/23 1506  Weight: 165 lb (74.8 kg)  Height: 5' 5.5" (1.664 m)   Body mass index is 27.04 kg/m.     01/21/2023    3:15 PM 09/23/2016    8:56 AM  Advanced Directives  Does Patient Have a Medical Advance Directive? Yes Yes  Type of Estate agent of [place];Living will Healthcare Power of [place];Living will  Does patient want to make changes to medical advance directive? No - Patient declined No - Patient declined  Copy of Healthcare Power of Attorney in Chart? Yes - validated most recent copy scanned in chart (See row information) No - copy requested    Current Medications (verified) Outpatient Encounter Medications as of 01/21/2023  Medication Sig   acetaminophen (TYLENOL) 500 MG tablet Take 1 tablet (500 mg total) by mouth every 6 (six) hours as needed.   B Complex-Biotin-FA (B-100 B-COMPLEX) TABS    Biotin 5000 MCG CAPS 1 capsule Orally Once a day   Cholecalciferol (VITAMIN D3) 25 MCG (1000 UT) CAPS Take 1 capsule (1,000 Units total) by mouth daily.   naproxen sodium (ALEVE) 220 MG tablet Take 220 mg by mouth daily as needed.   vitamin B-12 (CYANOCOBALAMIN) 100 MCG tablet Orally   traMADol (ULTRAM) 50 MG  tablet Take 0.5-1 tablets (25-50 mg total) by mouth 2 (two) times daily as needed. (Patient not taking: Reported on 01/21/2023)   No facility-administered encounter medications on file as of 01/21/2023.    Allergies (verified) Bee venom, Lunesta [eszopiclone], and Melatonin   History: Past Medical History:  Diagnosis Date   Anxiety    Arthritis    Cataract    Depression    Diverticula of colon    sigmoid   Family history of breast cancer    Family history of colon cancer    Family history of lymphoma    Family history of pancreatic cancer    Family history of prostate cancer    Family history of renal cell carcinoma    Family history of stomach cancer    HLD (hyperlipidemia)    Insomnia    Lymphocytic colitis 06/07/2013   Microhematuria 06/2013   stable CT and [place], seemed to resolve, released from uro care 03/2014 ([facility])   Polyp, sigmoid colon    hyperplastic   PONV (postoperative nausea and vomiting)    OK after last cataract surgery   Postmenopausal atrophic vaginitis    responded to vagifem   Urine incontinence    Past Surgical History:  Procedure Laterality Date   Brain MRI  2007   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 Inez Pilgrim   CATARACT EXTRACTION [facility] Left 09/23/2016  Procedure: CATARACT EXTRACTION PHACO AND INTRAOCULAR LENS PLACEMENT (IOC)  Left;  Surgeon: Lockie Mola, MD;  Location: [facility];  Service: Ophthalmology;  Laterality: Left;   COLONOSCOPY  06/2013   microscopic colitis, rpt 5 yrs Leone Payor)   COLONOSCOPY  09/2018   diverticulosis, rpt 5 yrs due to fmhx Leone Payor)   DEXA  05/2012   normal   MENISCUS REPAIR Right 2023   SHOULDER SURGERY Right 2005   Calcium deposit in tendon   TUBAL LIGATION  1980   URETHRAL SLING  08/27/2009   [facility]   VAGINAL HYSTERECTOMY  1993   fibroids, ovaries remained ([facility])   Family History   Problem Relation Age of Onset   Cancer Mother 70       colon, lymphoma   Cancer Brother 67       RCC stage 4   Cancer Brother 32       pancreatic   Cancer Cousin 48       lung   Cancer Cousin 21       nerve cancer in leg   Cancer Maternal Uncle 64       stomach   Stomach cancer Maternal Uncle    Cancer Cousin        NHL   Diabetes Father    CAD Father 61       stents x2   Cancer Father 30       pancreatic   CAD Maternal Uncle    Colon cancer Maternal Uncle        dx 5s   Breast cancer Paternal Aunt 26   Breast cancer Paternal Aunt 11   Prostate cancer Paternal Uncle    Stomach cancer Maternal Grandfather    Leukemia Paternal Grandmother    Cancer Paternal Uncle        unknown type   Cancer Cousin        unknown type, dx in 22s   Colon polyps Neg Hx    Esophageal cancer Neg Hx    Rectal cancer Neg Hx    Social History   Socioeconomic History   Marital status: Married    Spouse name: Not on file   Number of children: 1   Years of education: Not on file   Highest education level: Not on file  Occupational History   Occupation: Retired  Tobacco Use   Smoking status: Never   Smokeless tobacco: Never  Vaping Use   Vaping status: Never Used  Substance and Sexual Activity   Alcohol use: No    Comment: occasional   Drug use: Yes    Frequency: 7.0 times per week    Types: Other-see comments    Comment: Delta 8 - 1/2 at night   Sexual activity: Not on file  Other Topics Concern   Not on file  Social History Narrative   Married, 1 son   Had been living in [place], return to [facility] fall 2014   Occupation: Retired, prior worked at [employer]   Edu: [employer]   Activity: plays with dog, some walking    Diet: good water, fruits/vegetables daily   Social Determinants of Health   Financial Resource Strain: Low Risk  (01/21/2023)   Overall Financial Resource Strain (CARDIA)    Difficulty of Paying Living Expenses: Not hard at all  Food  Insecurity: No Food Insecurity (01/21/2023)   Hunger Vital Sign    Worried About Running Out of Food in the Last Year: Never true  Ran Out of Food in the Last Year: Never true  Transportation Needs: No Transportation Needs (01/21/2023)   PRAPARE - Administrator, Civil Service (Medical): No    Lack of Transportation (Non-Medical): No  Physical Activity: Sufficiently Active (01/21/2023)   Exercise Vital Sign    Days of Exercise per Week: 5 days    Minutes of Exercise per Session: 40 min  Stress: No Stress Concern Present (01/21/2023)   [employer] of Occupational Health - Occupational Stress Questionnaire    Feeling of Stress : Not at all  Social Connections: Moderately Isolated (01/21/2023)   Social Connection and Isolation Panel [NHANES]    Frequency of Communication with Friends and Family: More than three times a week    Frequency of Social Gatherings with Friends and Family: More than three times a week    Attends Religious Services: Never    Database administrator or Organizations: No    Attends Engineer, structural: Never    Marital Status: Married    Tobacco Counseling Counseling given: Not Answered   Clinical Intake:  Pre-visit preparation completed: Yes  Pain : No/denies pain     BMI - recorded: 27.04 Nutritional Status: BMI 25 -29 Overweight Nutritional Risks: None Diabetes: No  How often do you need to have someone help you when you read instructions, pamphlets, or other written materials from your doctor or pharmacy?: 1 - Never  Interpreter Needed?: No  Information entered by :: C.Jewelz Ricklefs LPN   Activities of Daily Living    01/21/2023    3:17 PM  In your present state of health, do you have any difficulty performing the following activities:  Hearing? 0  Vision? 0  Difficulty concentrating or making decisions? 0  Walking or climbing stairs? 1  Comment hard climbing down due to knee  Dressing or bathing? 0  Doing errands,  shopping? 0  Preparing Food and eating ? N  Using the Toilet? N  In the past six months, have you accidently leaked urine? N  Do you have problems with loss of bowel control? N  Managing your Medications? N  Managing your Finances? N  Housekeeping or managing your Housekeeping? N    Patient Care Team: Eustaquio Boyden, MD as PCP - General (Family Medicine)  Indicate any recent Medical Services you may have received from other than [facility] providers in the past year (date may be approximate).     Assessment:   This is a routine wellness examination for Shannon Kelly.  Hearing/Vision screen Hearing Screening - Comments:: Denies hearing difficulties   Vision Screening - Comments:: Glasses - [facility] - Pt due for exam and will call to schedule appointment   Dietary issues and exercise activities discussed:     Goals Addressed             This Visit's Progress    Patient Stated       Lose 20 pounds       Depression Screen    01/21/2023    3:14 PM 01/28/2022   10:00 AM 01/21/2021   10:24 AM 01/15/2020   10:28 AM 01/13/2019    2:18 PM 10/13/2017   11:42 AM 10/13/2017   11:32 AM  PHQ 2/9 Scores  PHQ - 2 Score 0 0 0 0 0 0 0  PHQ- 9 Score   3 8       Fall Risk    01/21/2023    3:17 PM 01/28/2022   10:00 AM 01/15/2020  9:25 AM 01/13/2019    2:18 PM 10/13/2017   11:42 AM  Fall Risk   Falls in the past year? 0 0 0 0 No  Number falls in past yr: 0      Injury with Fall? 0      Risk for fall due to : No Fall Risks      Follow up Falls prevention discussed;Falls evaluation completed        MEDICARE RISK AT HOME:  Medicare Risk at Home - 01/21/23 1518     Any stairs in or around the home? Yes    If so, are there any without handrails? No    Home free of loose throw rugs in walkways, pet beds, electrical cords, etc? Yes    Adequate lighting in your home to reduce risk of falls? Yes    Life alert? No    Use of a cane, walker or w/c? No    Grab bars in the bathroom? No     Shower chair or bench in shower? No    Elevated toilet seat or a handicapped toilet? Yes             TIMED UP AND GO:  Was the test performed?  No    Cognitive Function:        01/21/2023    3:18 PM  6CIT Screen  What Year? 0 points  What month? 0 points  What time? 0 points  Count back from 20 0 points  Months in reverse 0 points  Repeat phrase 0 points  Total Score 0 points    Immunizations Immunization History  Administered Date(s) Administered   Fluad Quad(high Dose 65+) 03/16/2019   Hep A / Hep B 11/04/2015, 01/08/2016, 05/06/2016   Influenza, High Dose Seasonal PF 03/27/2021   Influenza,inj,Quad PF,6+ Mos 06/21/2013, 04/27/2014, 05/15/2015, 04/20/2018   Influenza-Unspecified 04/23/2017, 04/12/2020   PFIZER(Purple Top)SARS-COV-2 Vaccination 08/29/2019, 09/19/2019, 07/31/2020   Pfizer Covid-19 Vaccine Bivalent Booster 52yrs & up 05/02/2021   Pneumococcal Conjugate-13 10/07/2016   Pneumococcal Polysaccharide-23 10/13/2017   Tdap 08/11/2013   Zoster, Live 05/30/2012    TDAP status: Up to date  Flu Vaccine status: Up to date per pt  Pneumococcal vaccine status: Up to date  Covid-19 vaccine status: Information provided on how to obtain vaccines.   Qualifies for Shingles Vaccine? Yes   Zostavax completed  yes   Shingrix Completed?: Yes per pt, pt will provide documentation.  Screening Tests Health Maintenance  Topic Date Due   Zoster Vaccines- Shingrix (1 of 2) 09/21/1970   COVID-19 Vaccine (5 - 2023-24 season) 03/06/2022   Medicare Annual Wellness (AWV)  01/29/2023   INFLUENZA VACCINE  02/04/2023   Colonoscopy  07/22/2023   DTaP/Tdap/Td (2 - Td or Tdap) 08/12/2023   MAMMOGRAM  10/12/2023   Pneumonia Vaccine 89+ Years old  Completed   DEXA SCAN  Completed   Hepatitis C Screening  Completed   HPV VACCINES  Aged Out    Health Maintenance  Health Maintenance Due  Topic Date Due   Zoster Vaccines- Shingrix (1 of 2) 09/21/1970   COVID-19 Vaccine  (5 - 2023-24 season) 03/06/2022   Medicare Annual Wellness (AWV)  01/29/2023    Colorectal cancer screening: Type of screening: Colonoscopy. Completed 07/21/18. Repeat every 5 years  Mammogram status: Completed 10/12/22. Repeat every year  Bone Density status: Ordered 01/21/23. Pt provided with contact info and advised to call to schedule appt.  Lung Cancer Screening: (Low Dose CT Chest recommended if  Age 33-80 years, 20 pack-year currently smoking OR have quit w/in 15years.) does not qualify.   Lung Cancer Screening Referral: n/a  Additional Screening:  Hepatitis C Screening: does qualify; Completed 10/05/16  Vision Screening: Recommended annual ophthalmology exams for early detection of glaucoma and other disorders of the eye. Is the patient up to date with their annual eye exam?  Yes , but due soon, pt will call for appointment. Who is the provider or what is the name of the office in which the patient attends annual eye exams? [facility] If pt is not established with a provider, would they like to be referred to a provider to establish care? Yes .   Dental Screening: Recommended annual dental exams for proper oral hygiene    Community Resource Referral / Chronic Care Management: CRR required this visit?  No   CCM required this visit?  No     Plan:     I have personally reviewed and noted the following in the patient's chart:   Medical and social history Use of alcohol, tobacco or illicit drugs  Current medications and supplements including opioid prescriptions. Patient is not currently taking opioid prescriptions. Functional ability and status Nutritional status Physical activity Advanced directives List of other physicians Hospitalizations, surgeries, and ER visits in previous 12 months Vitals Screenings to include cognitive, depression, and falls Referrals and appointments  In addition, I have reviewed and discussed with patient certain preventive  protocols, quality metrics, and best practice recommendations. A written personalized care plan for preventive services as well as general preventive health recommendations were provided to patient.     Maryan Puls, LPN   [id number]   After Visit Summary: (MyChart) Due to this being a telephonic visit, the after visit summary with patients personalized plan was offered to patient via MyChart   Nurse Notes: Dexa scan order placed.

## 2023-01-21 NOTE — Patient Instructions (Signed)
Shannon Kelly , Thank you for taking time to come for your Medicare Wellness Visit. I appreciate your ongoing commitment to your health goals. Please review the following plan we discussed and let me know if I can assist you in the future.   These are the goals we discussed:  Goals      Patient Stated     Lose 20 pounds        This is a list of the screening recommended for you and due dates:  Health Maintenance  Topic Date Due   Zoster (Shingles) Vaccine (1 of 2) 09/21/1970   COVID-19 Vaccine (5 - 2023-24 season) 03/06/2022   Medicare Annual Wellness Visit  01/29/2023   Flu Shot  02/04/2023   Colon Cancer Screening  07/22/2023   DTaP/Tdap/Td vaccine (2 - Td or Tdap) 08/12/2023   Mammogram  10/12/2023   Pneumonia Vaccine  Completed   DEXA scan (bone density measurement)  Completed   Hepatitis C Screening  Completed   HPV Vaccine  Aged Out   You have an order for:  []   2D Mammogram  []   3D Mammogram  [x]   Bone Density     Please call for appointment:  [facility] [address] [place], [place] [address] [phone]  [facility] [address] [place], [place] [address] [phone]  [facility] [address] [place], [place] [address] [phone]  [facility] [place] [facility] [address] [place], [place] [address] [phone]  [facility] [address] [place], [place] [address] [phone]    Make sure to wear two-piece clothing.  No lotions, powders, or deodorants the day of the appointment. Make sure to bring picture ID and insurance card.  Bring list of medications you are currently taking including any supplements.   Schedule your [place] screening mammogram through MyChart!   Log into your MyChart account.  Go to 'Visit' (or 'Appointments' if on mobile App) --> Schedule an Appointment  Under 'Select a Reason for Visit'  choose the Mammogram Screening option.  Complete the pre-visit questions and select the time and place that best fits your schedule.    Advanced directives: has documents  Conditions/risks identified: Aim for 30 minutes of exercise or brisk walking, 6-8 glasses of water, and 5 servings of fruits and vegetables each day.   Next appointment: Follow up in one year for your annual wellness visit 01/26/24 @ 1:30 televisit   Preventive Care 65 Years and Older, Female Preventive care refers to lifestyle choices and visits with your health care provider that can promote health and wellness. What does preventive care include? A yearly physical exam. This is also called an annual well check. Dental exams once or twice a year. Routine eye exams. Ask your health care provider how often you should have your eyes checked. Personal lifestyle choices, including: Daily care of your teeth and gums. Regular physical activity. Eating a healthy diet. Avoiding tobacco and drug use. Limiting alcohol use. Practicing safe sex. Taking low-dose aspirin every day. Taking vitamin and mineral supplements as recommended by your health care provider. What happens during an annual well check? The services and screenings done by your health care provider during your annual well check will depend on your age, overall health, lifestyle risk factors, and family history of disease. Counseling  Your health care provider may ask you questions about your: Alcohol use. Tobacco use.  Drug use. Emotional well-being. Home and relationship well-being. Sexual activity. Eating habits. History of falls. Memory and ability to understand (cognition). Work and work Astronomer. Reproductive health. Screening  You may have the following tests or measurements: Height, weight, and BMI. Blood pressure. Lipid and cholesterol levels. These may be checked every 5 years, or more frequently if you are over 37 years old. Skin  check. Lung cancer screening. You may have this screening every year starting at age 16 if you have a 30-pack-year history of smoking and currently smoke or have quit within the past 15 years. Fecal occult blood test (FOBT) of the stool. You may have this test every year starting at age 80. Flexible sigmoidoscopy or colonoscopy. You may have a sigmoidoscopy every 5 years or a colonoscopy every 10 years starting at age 42. Hepatitis C blood test. Hepatitis B blood test. Sexually transmitted disease (STD) testing. Diabetes screening. This is done by checking your blood sugar (glucose) after you have not eaten for a while (fasting). You may have this done every 1-3 years. Bone density scan. This is done to screen for osteoporosis. You may have this done starting at age 93. Mammogram. This may be done every 1-2 years. Talk to your health care provider about how often you should have regular mammograms. Talk with your health care provider about your test results, treatment options, and if necessary, the need for more tests. Vaccines  Your health care provider may recommend certain vaccines, such as: Influenza vaccine. This is recommended every year. Tetanus, diphtheria, and acellular pertussis (Tdap, Td) vaccine. You may need a Td booster every 10 years. Zoster vaccine. You may need this after age 36. Pneumococcal 13-valent conjugate (PCV13) vaccine. One dose is recommended after age 41. Pneumococcal polysaccharide (PPSV23) vaccine. One dose is recommended after age 87. Talk to your health care provider about which screenings and vaccines you need and how often you need them. This information is not intended to replace advice given to you by your health care provider. Make sure you discuss any questions you have with your health care provider. Document Released: 07/19/2015 Document Revised: 03/11/2016 Document Reviewed: 04/23/2015 Elsevier Interactive Patient Education  2017 [employer].  Fall  Prevention in the Home Falls can cause injuries. They can happen to people of all ages. There are many things you can do to make your home safe and to help prevent falls. What can I do on the outside of my home? Regularly fix the edges of walkways and driveways and fix any cracks. Remove anything that might make you trip as you walk through a door, such as a raised step or threshold. Trim any bushes or trees on the path to your home. Use bright outdoor lighting. Clear any walking paths of anything that might make someone trip, such as rocks or tools. Regularly check to see if handrails are loose or broken. Make sure that both sides of any steps have handrails. Any raised decks and porches should have guardrails on the edges. Have any leaves, snow, or ice cleared regularly. Use sand or salt on walking paths during winter. Clean up any spills in your garage right away. This includes oil or grease spills. What can I do in the bathroom? Use night lights. Install grab bars by the toilet and in the tub and shower. Do not use towel bars as grab bars. Use non-skid mats or decals in the tub or shower. If you need to sit down in the shower, use a plastic,  non-slip stool. Keep the floor dry. Clean up any water that spills on the floor as soon as it happens. Remove soap buildup in the tub or shower regularly. Attach bath mats securely with double-sided non-slip rug tape. Do not have throw rugs and other things on the floor that can make you trip. What can I do in the bedroom? Use night lights. Make sure that you have a light by your bed that is easy to reach. Do not use any sheets or blankets that are too big for your bed. They should not hang down onto the floor. Have a firm chair that has side arms. You can use this for support while you get dressed. Do not have throw rugs and other things on the floor that can make you trip. What can I do in the kitchen? Clean up any spills right away. Avoid  walking on wet floors. Keep items that you use a lot in easy-to-reach places. If you need to reach something above you, use a strong step stool that has a grab bar. Keep electrical cords out of the way. Do not use floor polish or wax that makes floors slippery. If you must use wax, use non-skid floor wax. Do not have throw rugs and other things on the floor that can make you trip. What can I do with my stairs? Do not leave any items on the stairs. Make sure that there are handrails on both sides of the stairs and use them. Fix handrails that are broken or loose. Make sure that handrails are as long as the stairways. Check any carpeting to make sure that it is firmly attached to the stairs. Fix any carpet that is loose or worn. Avoid having throw rugs at the top or bottom of the stairs. If you do have throw rugs, attach them to the floor with carpet tape. Make sure that you have a light switch at the top of the stairs and the bottom of the stairs. If you do not have them, ask someone to add them for you. What else can I do to help prevent falls? Wear shoes that: Do not have high heels. Have rubber bottoms. Are comfortable and fit you well. Are closed at the toe. Do not wear sandals. If you use a stepladder: Make sure that it is fully opened. Do not climb a closed stepladder. Make sure that both sides of the stepladder are locked into place. Ask someone to hold it for you, if possible. Clearly mark and make sure that you can see: Any grab bars or handrails. First and last steps. Where the edge of each step is. Use tools that help you move around (mobility aids) if they are needed. These include: Canes. Walkers. Scooters. Crutches. Turn on the lights when you go into a dark area. Replace any light bulbs as soon as they burn out. Set up your furniture so you have a clear path. Avoid moving your furniture around. If any of your floors are uneven, fix them. If there are any pets around  you, be aware of where they are. Review your medicines with your doctor. Some medicines can make you feel dizzy. This can increase your chance of falling. Ask your doctor what other things that you can do to help prevent falls. This information is not intended to replace advice given to you by your health care provider. Make sure you discuss any questions you have with your health care provider. Document Released: 04/18/2009 Document Revised: 11/28/2015 Document  Reviewed: 07/27/2014 Elsevier Interactive Patient Education  2017 [employer].

## 2023-01-25 ENCOUNTER — Other Ambulatory Visit: Payer: Self-pay | Admitting: Family Medicine

## 2023-01-25 DIAGNOSIS — N1831 Chronic kidney disease, stage 3a: Secondary | ICD-10-CM

## 2023-01-25 DIAGNOSIS — E7800 Pure hypercholesterolemia, unspecified: Secondary | ICD-10-CM

## 2023-01-26 ENCOUNTER — Other Ambulatory Visit (INDEPENDENT_AMBULATORY_CARE_PROVIDER_SITE_OTHER): Payer: PPO

## 2023-01-26 DIAGNOSIS — E7800 Pure hypercholesterolemia, unspecified: Secondary | ICD-10-CM

## 2023-01-26 DIAGNOSIS — N1831 Chronic kidney disease, stage 3a: Secondary | ICD-10-CM | POA: Diagnosis not present

## 2023-01-26 LAB — COMPREHENSIVE METABOLIC PANEL
ALT: 16 U/L (ref 0–35)
AST: 17 U/L (ref 0–37)
Albumin: 4 g/dL (ref 3.5–5.2)
Alkaline Phosphatase: 70 U/L (ref 39–117)
BUN: 20 mg/dL (ref 6–23)
CO2: 28 mEq/L (ref 19–32)
Calcium: 9.5 mg/dL (ref 8.4–10.5)
Chloride: 107 mEq/L (ref 96–112)
Creatinine, Ser: 0.99 mg/dL (ref 0.40–1.20)
GFR: 57.43 mL/min — ABNORMAL LOW (ref >60.00)
Glucose, Bld: 87 mg/dL (ref 70–99)
Potassium: 4.2 mEq/L (ref 3.5–5.1)
Sodium: 142 mEq/L (ref 135–145)
Total Bilirubin: 0.7 mg/dL (ref 0.2–1.2)
Total Protein: 6.7 g/dL (ref 6.0–8.3)

## 2023-01-26 LAB — CBC WITH DIFFERENTIAL/PLATELET
Basophils Absolute: 0 10*3/uL (ref 0.0–0.1)
Basophils Relative: 0.4 % (ref 0.0–3.0)
Eosinophils Absolute: 0.1 10*3/uL (ref 0.0–0.7)
Eosinophils Relative: 2.5 % (ref 0.0–5.0)
HCT: 37.6 % (ref 36.0–46.0)
Hemoglobin: 12.4 g/dL (ref 12.0–15.0)
Lymphocytes Relative: 30.1 % (ref 12.0–46.0)
Lymphs Abs: 1.7 10*3/uL (ref 0.7–4.0)
MCHC: 33.1 g/dL (ref 30.0–36.0)
MCV: 90.7 fl (ref 78.0–100.0)
Monocytes Absolute: 0.4 10*3/uL (ref 0.1–1.0)
Monocytes Relative: 8 % (ref 3.0–12.0)
Neutro Abs: 3.3 10*3/uL (ref 1.4–7.7)
Neutrophils Relative %: 59 % (ref 43.0–77.0)
Platelets: 319 10*3/uL (ref 150.0–400.0)
RBC: 4.14 Mil/uL (ref 3.87–5.11)
RDW: 13.8 % (ref 11.5–15.5)
WBC: 5.6 10*3/uL (ref 4.0–10.5)

## 2023-01-26 LAB — LIPID PANEL
Cholesterol: 223 mg/dL — ABNORMAL HIGH (ref 0–200)
HDL: 50.3 mg/dL (ref >39.00)
LDL Cholesterol: 140 mg/dL — ABNORMAL HIGH (ref 0–99)
NonHDL: 172.9
Total CHOL/HDL Ratio: 4
Triglycerides: 165 mg/dL — ABNORMAL HIGH (ref 0.0–149.0)
VLDL: 33 mg/dL (ref 0.0–40.0)

## 2023-01-26 LAB — MICROALBUMIN / CREATININE URINE RATIO
Creatinine,U: 139.7 mg/dL
Microalb Creat Ratio: 3.4 mg/g (ref 0.0–30.0)
Microalb, Ur: 4.8 mg/dL — ABNORMAL HIGH (ref 0.0–1.9)

## 2023-01-26 LAB — VITAMIN D 25 HYDROXY (VIT D DEFICIENCY, FRACTURES): VITD: 27.21 ng/mL — ABNORMAL LOW (ref 30.00–100.00)

## 2023-01-26 LAB — PHOSPHORUS: Phosphorus: 3.6 mg/dL (ref 2.3–4.6)

## 2023-01-27 LAB — PARATHYROID HORMONE, INTACT (NO CA): PTH: 39 pg/mL (ref 16–77)

## 2023-02-02 ENCOUNTER — Encounter: Payer: Self-pay | Admitting: Family Medicine

## 2023-02-02 ENCOUNTER — Ambulatory Visit (INDEPENDENT_AMBULATORY_CARE_PROVIDER_SITE_OTHER): Payer: PPO | Admitting: Family Medicine

## 2023-02-02 VITALS — BP 128/80 | HR 76 | Temp 97.5°F | Ht 65.0 in | Wt 163.1 lb

## 2023-02-02 DIAGNOSIS — E782 Mixed hyperlipidemia: Secondary | ICD-10-CM

## 2023-02-02 DIAGNOSIS — R42 Dizziness and giddiness: Secondary | ICD-10-CM

## 2023-02-02 DIAGNOSIS — Z0000 Encounter for general adult medical examination without abnormal findings: Secondary | ICD-10-CM

## 2023-02-02 DIAGNOSIS — Z7189 Other specified counseling: Secondary | ICD-10-CM

## 2023-02-02 DIAGNOSIS — N1831 Chronic kidney disease, stage 3a: Secondary | ICD-10-CM

## 2023-02-02 MED ORDER — VITAMIN B-12 1000 MCG PO TABS: 1000.0000 ug | ORAL_TABLET | Freq: Every day | ORAL | Status: DC

## 2023-02-02 NOTE — Assessment & Plan Note (Signed)
Ongoing, mild. Normal neurological exam. She will let me know if progressive or more pronounced for vestibular rehab vs balance training program.

## 2023-02-02 NOTE — Patient Instructions (Addendum)
Try to move most of your fluid intake to the daytime.  Consider RSV vaccine through pharmacy.  You are doing well today Return as needed or in 1 year for next physical/wellness visit

## 2023-02-02 NOTE — Assessment & Plan Note (Signed)
Not on medication. Encouraged diet choices to improve cholesterol levels. The 10-year ASCVD risk score (Arnett DK, et al., 2019) is: 11%   Values used to calculate the score:     Age: 72 years     Sex: Female     Is Non-Hispanic African American: No     Diabetic: No     Tobacco smoker: No     Systolic Blood Pressure: 128 mmHg     Is BP treated: No     HDL Cholesterol: 50.3 mg/dL     Total Cholesterol: 223 mg/dL

## 2023-02-02 NOTE — Progress Notes (Signed)
Ph: [phone] Fax: [phone]   Patient ID: Shannon Kelly, female    DOB: 10-17-1950, 72 y.o.   MRN: [id number]  This visit was conducted in person.  BP 128/80   Pulse 76   Temp (!) 97.5 F (36.4 C) (Temporal)   Ht 5\' 5"  (1.651 m)   Wt 163 lb 2 oz (74 kg)   SpO2 98%   BMI 27.15 kg/m    CC: CPE Subjective:   HPI: Shannon Kelly is a 72 y.o. female presenting on 02/02/2023 for Annual Exam (MCR prt 2 [AWV- 01/21/23]. C/o not having to urinate much during day, despite drinking plenty of water. )   Saw health advisor last week for medicare wellness visit. Note reviewed.   No results found.  Flowsheet Row Clinical Support from 01/21/2023 in [facility]  PHQ-2 Total Score 0          01/21/2023    3:17 PM 01/28/2022   10:00 AM 01/15/2020    9:25 AM 01/13/2019    2:18 PM 10/13/2017   11:42 AM  Fall Risk   Falls in the past year? 0 0 0 0 No  Number falls in past yr: 0      Injury with Fall? 0      Risk for fall due to : No Fall Risks      Follow up Falls prevention discussed;Falls evaluation completed        Notes nocturia without daytime frequency. No dysuria, urgency, hematuria.  Occ exertional unsteadiness, occ unsteadiness after driving in her car. Notes trouble with balance. No recent falls. No tinnitus, hearing changes, nausea/vomiting with unsteadiness.   Lymphocytic colitis - treated with budesonide course 06/2020 without much improvement - she actually found that melatonin caused this diarrhea.    Ongoing R knee issues after complex lateral meniscal tear s/p surgery 10/2021. Rpt MRI 01/2022 again shows complex tear of posterior horn of lateral meniscus. Discussing rpt surgery. Rpt knee surgery 02/2022. S/p physical therapy and has since returned to [facility] with benefit.   Came off lorazepam - now using delta-8 to help her sleep.   Preventative: COLONOSCOPY 09/2018 - diverticulosis, rpt 5 yrs due to fmhx [facility]or)  Well  woman - with OBGYN prior in [place] (Dr. Abe People). S/p vaginal hysterectomy 1993 for fibroids, ovaries remain. Denies pelvic pain or pressure, vaginal bleeding, skin changes.  [facility]  DEXA 2013 - WNL. Good milk intake, leafy greens, regular weight bearing exercises.  Lung cancer screening - not eligible  Flu shot yearly COVID vaccine - Pfizer 08/2019, 09/2019 and booster 07/2020, bivalent 04/2021 Prevnar-13 2018, pneumovax-23 2019 Tdap - 08/2013  zostavax - 05/2012 Shingrix - 01/29/2022, rpt completed as well RSV - discussed Advanced directives: received and scanned into chart. Husband Shannon Kelly then son Shannon Kelly are HCPOA. Does not want life prolonging measures if terminal condition.  Seat belt use discussed.  Sunscreen use discussed.  No changing moles on skin. Sees dermatologist. Sleep - averaging 6.5 hours/night Non smoker  Alcohol - rare Dentist - q4 mo for periodontal disease  Eye exam Q1-2 yrs Bowel - no constipation  Bladder - occasional urge incontinence s/p urethral sling   Lives with husband, married, 1 son Had been living in [place], return to [place] fall 2014 Occupation: Retired, prior worked at [employer]: [employer] Activity: outdoor walking, joined gym through [employer] - going to [employer] since 10/2019  Diet: good water, fruits/vegetables daily     Relevant past medical, surgical, family and social history reviewed and updated as indicated. Interim medical history since our last visit reviewed. Allergies and medications reviewed and updated. Outpatient Medications Prior to Visit  Medication Sig Dispense Refill   Biotin 5000 MCG CAPS 1 capsule Orally Once a day     Cholecalciferol (VITAMIN D3) 25 MCG (1000 UT) CAPS Take 1 capsule (1,000 Units total) by mouth daily. 60 capsule    MISC NATURAL PRODUCTS PO Take by mouth at bedtime. Delta-8     vitamin B-12 (CYANOCOBALAMIN) 100 MCG tablet Orally      acetaminophen (TYLENOL) 500 MG tablet Take 1 tablet (500 mg total) by mouth every 6 (six) hours as needed. 30 tablet 0   B Complex-Biotin-FA (B-100 B-COMPLEX) TABS      naproxen sodium (ALEVE) 220 MG tablet Take 220 mg by mouth daily as needed.     traMADol (ULTRAM) 50 MG tablet Take 0.5-1 tablets (25-50 mg total) by mouth 2 (two) times daily as needed. (Patient not taking: Reported on 01/21/2023) 20 tablet 0   No facility-administered medications prior to visit.     Per HPI unless specifically indicated in ROS section below Review of Systems  Constitutional:  Negative for activity change, appetite change, chills, fatigue, fever and unexpected weight change.  HENT:  Negative for hearing loss.   Eyes:  Negative for visual disturbance.  Respiratory:  Negative for cough, chest tightness, shortness of breath and wheezing.   Cardiovascular:  Negative for chest pain, palpitations and leg swelling.  Gastrointestinal:  Negative for abdominal distention, abdominal pain, blood in stool, constipation, diarrhea, nausea and vomiting.  Genitourinary:  Negative for difficulty urinating and hematuria.  Musculoskeletal:  Negative for arthralgias, myalgias and neck pain.  Skin:  Negative for rash.  Neurological:  Negative for dizziness, seizures, syncope and headaches.  Hematological:  Negative for adenopathy. Bruises/bleeds easily.  Psychiatric/Behavioral:  Negative for dysphoric mood. The patient is not nervous/anxious.     Objective:  BP 128/80   Pulse 76   Temp (!) 97.5 F (36.4 C) (Temporal)   Ht 5\' 5"  (1.651 m)   Wt 163 lb 2 oz (74 kg)   SpO2 98%   BMI 27.15 kg/m   Wt Readings from Last 3 Encounters:  02/02/23 163 lb 2 oz (74 kg)  01/21/23 165 lb (74.8 kg)  01/28/22 157 lb 6 oz (71.4 kg)      Physical Exam Vitals and nursing note reviewed.  Constitutional:      Appearance: Normal appearance. She is not ill-appearing.  HENT:     Head: Normocephalic and atraumatic.     Right Ear:  Tympanic membrane, ear canal and external ear normal. There is no impacted cerumen.     Left Ear: Tympanic membrane, ear canal and external ear normal. There is no impacted cerumen.     Nose: Nose normal.     Mouth/Throat:     Mouth: Mucous membranes are moist.     Pharynx: Oropharynx is clear. No oropharyngeal exudate or posterior oropharyngeal erythema.  Eyes:     General:        Right eye: No discharge.        Left eye: No discharge.     Extraocular Movements: Extraocular movements intact.     Conjunctiva/sclera: Conjunctivae normal.     Pupils: Pupils are equal, round, and reactive to light.  Neck:     Thyroid: No thyroid mass or thyromegaly.  Vascular: No carotid bruit.  Cardiovascular:     Rate and Rhythm: Normal rate and regular rhythm.     Pulses: Normal pulses.     Heart sounds: Normal heart sounds. No murmur heard. Pulmonary:     Effort: Pulmonary effort is normal. No respiratory distress.     Breath sounds: Normal breath sounds. No wheezing, rhonchi or rales.  Abdominal:     General: Bowel sounds are normal. There is no distension.     Palpations: Abdomen is soft. There is no mass.     Tenderness: There is no abdominal tenderness. There is no guarding or rebound.     Hernia: No hernia is present.  Musculoskeletal:     Cervical back: Normal range of motion and neck supple. No rigidity.     Right lower leg: No edema.     Left lower leg: No edema.  Lymphadenopathy:     Cervical: No cervical adenopathy.  Skin:    General: Skin is warm and dry.     Findings: No rash.  Neurological:     General: No focal deficit present.     Mental Status: She is alert. Mental status is at baseline.     Cranial Nerves: Cranial nerves 2-12 are intact.     Sensory: Sensation is intact.     Motor: Motor function is intact.     Coordination: Coordination is intact.     Gait: Gait is intact.     Comments:  CN 2-12 intact DIx hallpike normal EOMI FTN intact  Psychiatric:         Mood and Affect: Mood normal.        Behavior: Behavior normal.       Results for orders placed or performed in visit on 01/26/23  CBC with Differential/Platelet  Result Value Ref Range   WBC 5.6 4.0 - 10.5 K/uL   RBC 4.14 3.87 - 5.11 Mil/uL   Hemoglobin 12.4 12.0 - 15.0 g/dL   HCT 30.8 65.7 - 84.6 %   MCV 90.7 78.0 - 100.0 fl   MCHC 33.1 30.0 - 36.0 g/dL   RDW 96.2 95.2 - 84.1 %   Platelets 319.0 150.0 - 400.0 K/uL   Neutrophils Relative % 59.0 43.0 - 77.0 %   Lymphocytes Relative 30.1 12.0 - 46.0 %   Monocytes Relative 8.0 3.0 - 12.0 %   Eosinophils Relative 2.5 0.0 - 5.0 %   Basophils Relative 0.4 0.0 - 3.0 %   Neutro Abs 3.3 1.4 - 7.7 K/uL   Lymphs Abs 1.7 0.7 - 4.0 K/uL   Monocytes Absolute 0.4 0.1 - 1.0 K/uL   Eosinophils Absolute 0.1 0.0 - 0.7 K/uL   Basophils Absolute 0.0 0.0 - 0.1 K/uL  Parathyroid hormone, intact (no Ca)  Result Value Ref Range   PTH 39 16 - 77 pg/mL  Microalbumin / creatinine urine ratio  Result Value Ref Range   Microalb, Ur 4.8 (H) 0.0 - 1.9 mg/dL   Creatinine,U 324.4 mg/dL   Microalb Creat Ratio 3.4 0.0 - 30.0 mg/g  VITAMIN D 25 Hydroxy (Vit-D Deficiency, Fractures)  Result Value Ref Range   VITD 27.21 (L) 30.00 - 100.00 ng/mL  Phosphorus  Result Value Ref Range   Phosphorus 3.6 2.3 - 4.6 mg/dL  Comprehensive metabolic panel  Result Value Ref Range   Sodium 142 135 - 145 mEq/L   Potassium 4.2 3.5 - 5.1 mEq/L   Chloride 107 96 - 112 mEq/L   CO2 28 19 - 32  mEq/L   Glucose, Bld 87 70 - 99 mg/dL   BUN 20 6 - 23 mg/dL   Creatinine, Ser 2.72 0.40 - 1.20 mg/dL   Total Bilirubin 0.7 0.2 - 1.2 mg/dL   Alkaline Phosphatase 70 39 - 117 U/L   AST 17 0 - 37 U/L   ALT 16 0 - 35 U/L   Total Protein 6.7 6.0 - 8.3 g/dL   Albumin 4.0 3.5 - 5.2 g/dL   GFR 53.66 (L) >44.03 mL/min   Calcium 9.5 8.4 - 10.5 mg/dL  Lipid panel  Result Value Ref Range   Cholesterol 223 (H) 0 - 200 mg/dL   Triglycerides 474.2 (H) 0.0 - 149.0 mg/dL   HDL 59.56 >38.75  mg/dL   VLDL 64.3 0.0 - 32.9 mg/dL   LDL Cholesterol 518 (H) 0 - 99 mg/dL   Total CHOL/HDL Ratio 4    NonHDL 172.90     Assessment & Plan:   Problem List Items Addressed This Visit     Health maintenance examination - Primary (Chronic)    Preventative protocols reviewed and updated unless pt declined. Discussed healthy diet and lifestyle.       Advanced care planning/counseling discussion (Chronic)    Previously discussed      HLD (hyperlipidemia)    Not on medication. Encouraged diet choices to improve cholesterol levels. The 10-year ASCVD risk score (Arnett DK, et al., 2019) is: 11%   Values used to calculate the score:     Age: 11 years     Sex: Female     Is Non-Hispanic African American: No     Diabetic: No     Tobacco smoker: No     Systolic Blood Pressure: 128 mmHg     Is BP treated: No     HDL Cholesterol: 50.3 mg/dL     Total Cholesterol: 223 mg/dL       Dizziness    Ongoing, mild. Normal neurological exam. She will let me know if progressive or more pronounced for vestibular rehab vs balance training program.       CKD (chronic kidney disease) stage 3, GFR 30-59 ml/min (HCC)    Chronic, actually improving. Will continue to monitor.         Meds ordered this encounter  Medications   cyanocobalamin (VITAMIN B12) 1000 MCG tablet    Sig: Take 1 tablet (1,000 mcg total) by mouth daily.    No orders of the defined types were placed in this encounter.   Patient Instructions  Try to move most of your fluid intake to the daytime.  Consider RSV vaccine through pharmacy.  You are doing well today Return as needed or in 1 year for next physical/wellness visit   Follow up plan: Return in about 1 year (around 02/02/2024), or if symptoms worsen or fail to improve, for annual exam, prior fasting for blood work, medicare wellness visit.  Eustaquio Boyden, MD

## 2023-02-02 NOTE — Assessment & Plan Note (Signed)
Preventative protocols reviewed and updated unless pt declined. Discussed healthy diet and lifestyle.  

## 2023-02-02 NOTE — Assessment & Plan Note (Signed)
Previously discussed.

## 2023-02-02 NOTE — Assessment & Plan Note (Addendum)
Chronic, actually improving. Will continue to monitor.

## 2023-02-15 ENCOUNTER — Telehealth: Payer: Self-pay | Admitting: Family Medicine

## 2023-02-15 NOTE — Telephone Encounter (Signed)
Patient called back, gutierrez pool please disregard this message. Patient was inquiring for an order for a dexa scan, not mammogram. The order was placed a while ago by Audria Nine and patient is scheduled at [facility] for dexa scan.

## 2023-02-15 NOTE — Telephone Encounter (Signed)
Noted  

## 2023-02-15 NOTE — Telephone Encounter (Signed)
Patient called the offices to request that the order for her mammogram be resent to [facility] in [place].patient can be reach at [facility] number if needed.

## 2023-02-18 DIAGNOSIS — L814 Other melanin hyperpigmentation: Secondary | ICD-10-CM | POA: Diagnosis not present

## 2023-02-18 DIAGNOSIS — D692 Other nonthrombocytopenic purpura: Secondary | ICD-10-CM | POA: Diagnosis not present

## 2023-02-24 ENCOUNTER — Ambulatory Visit
Admission: RE | Admit: 2023-02-24 | Discharge: 2023-02-24 | Disposition: A | Discharge status: Home / Self Care | Payer: PPO | Source: Ambulatory Visit | Attending: Nurse Practitioner | Admitting: Nurse Practitioner

## 2023-02-24 DIAGNOSIS — Z780 Asymptomatic menopausal state: Secondary | ICD-10-CM | POA: Diagnosis not present

## 2023-02-24 DIAGNOSIS — M85852 Other specified disorders of bone density and structure, left thigh: Secondary | ICD-10-CM | POA: Diagnosis not present

## 2023-02-27 ENCOUNTER — Encounter: Payer: Self-pay | Admitting: Family Medicine

## 2023-03-05 ENCOUNTER — Encounter: Payer: Self-pay | Admitting: Family Medicine

## 2023-03-05 DIAGNOSIS — M8580 Other specified disorders of bone density and structure, unspecified site: Secondary | ICD-10-CM | POA: Insufficient documentation

## 2023-04-27 ENCOUNTER — Telehealth: Payer: Self-pay | Admitting: Family Medicine

## 2023-04-27 NOTE — Telephone Encounter (Signed)
Pt brought by the her vaccine ppw from [facility] pharmacy for Dr. Reece Agar to review. Ppw is in Dr. Timoteo Expose folder. Call back # [phone]

## 2023-04-27 NOTE — Telephone Encounter (Signed)
Information updated in chart.  No further action needed at this time.

## 2023-07-26 ENCOUNTER — Encounter: Payer: Self-pay | Admitting: Family Medicine

## 2023-07-26 DIAGNOSIS — M150 Primary generalized (osteo)arthritis: Secondary | ICD-10-CM

## 2023-07-26 NOTE — Telephone Encounter (Signed)
Last OV:  02/02/23, CPE Next OV:  02/07/24, CPE

## 2023-07-28 MED ORDER — CELECOXIB 200 MG PO CAPS: 200.0000 mg | ORAL_CAPSULE | Freq: Every day | ORAL | 1 refills | Status: DC

## 2023-07-28 NOTE — Telephone Encounter (Signed)
ERx 

## 2023-08-03 ENCOUNTER — Encounter: Payer: Self-pay | Admitting: Family Medicine

## 2023-08-31 DIAGNOSIS — H01131 Eczematous dermatitis of right upper eyelid: Secondary | ICD-10-CM | POA: Diagnosis not present

## 2023-09-13 ENCOUNTER — Other Ambulatory Visit: Payer: Self-pay | Admitting: Family Medicine

## 2023-09-13 DIAGNOSIS — Z1231 Encounter for screening mammogram for malignant neoplasm of breast: Secondary | ICD-10-CM

## 2023-09-15 ENCOUNTER — Other Ambulatory Visit: Payer: Self-pay

## 2023-09-15 ENCOUNTER — Other Ambulatory Visit (INDEPENDENT_AMBULATORY_CARE_PROVIDER_SITE_OTHER)

## 2023-09-15 DIAGNOSIS — N1831 Chronic kidney disease, stage 3a: Secondary | ICD-10-CM | POA: Diagnosis not present

## 2023-09-15 NOTE — Telephone Encounter (Signed)
 Called patient to review provider recommendations on results. Lab appointment has been scheduled.  Lab order placed for future.  There will be no charge to patient for this visit. Note of this made in appointment desk.

## 2023-09-15 NOTE — Addendum Note (Signed)
 Addended by: Alvina Chou on: 09/15/2023 02:47 PM   Modules accepted: Orders

## 2023-09-16 ENCOUNTER — Encounter: Payer: Self-pay | Admitting: Family Medicine

## 2023-09-16 LAB — MICROALBUMIN / CREATININE URINE RATIO
Creatinine,U: 77.5 mg/dL
Microalb Creat Ratio: UNDETERMINED mg/g (ref 0.0–30.0)
Microalb, Ur: <0.7 mg/dL

## 2023-10-18 ENCOUNTER — Ambulatory Visit

## 2023-10-28 ENCOUNTER — Ambulatory Visit
Admission: RE | Admit: 2023-10-28 | Discharge: 2023-10-28 | Disposition: A | Discharge status: Home / Self Care | Source: Ambulatory Visit | Attending: Family Medicine | Admitting: Family Medicine

## 2023-10-28 ENCOUNTER — Other Ambulatory Visit: Payer: Self-pay | Admitting: Family Medicine

## 2023-10-28 ENCOUNTER — Ambulatory Visit

## 2023-10-28 DIAGNOSIS — N63 Unspecified lump in unspecified breast: Secondary | ICD-10-CM

## 2023-10-28 DIAGNOSIS — Z1231 Encounter for screening mammogram for malignant neoplasm of breast: Secondary | ICD-10-CM

## 2023-11-02 ENCOUNTER — Ambulatory Visit: Admitting: Podiatry

## 2023-11-02 DIAGNOSIS — Q6672 Congenital pes cavus, left foot: Secondary | ICD-10-CM

## 2023-11-02 DIAGNOSIS — H019 Unspecified inflammation of eyelid: Secondary | ICD-10-CM | POA: Insufficient documentation

## 2023-11-02 DIAGNOSIS — L570 Actinic keratosis: Secondary | ICD-10-CM | POA: Insufficient documentation

## 2023-11-02 DIAGNOSIS — M722 Plantar fascial fibromatosis: Secondary | ICD-10-CM | POA: Diagnosis not present

## 2023-11-02 DIAGNOSIS — Q667 Congenital pes cavus, unspecified foot: Secondary | ICD-10-CM

## 2023-11-02 DIAGNOSIS — L814 Other melanin hyperpigmentation: Secondary | ICD-10-CM | POA: Insufficient documentation

## 2023-11-02 DIAGNOSIS — Q6671 Congenital pes cavus, right foot: Secondary | ICD-10-CM | POA: Diagnosis not present

## 2023-11-02 DIAGNOSIS — D225 Melanocytic nevi of trunk: Secondary | ICD-10-CM | POA: Insufficient documentation

## 2023-11-02 DIAGNOSIS — D1801 Hemangioma of skin and subcutaneous tissue: Secondary | ICD-10-CM | POA: Insufficient documentation

## 2023-11-02 DIAGNOSIS — T148XXA Other injury of unspecified body region, initial encounter: Secondary | ICD-10-CM | POA: Insufficient documentation

## 2023-11-02 NOTE — Progress Notes (Signed)
 Subjective:  Patient ID: Shannon Kelly, female    DOB: 08/11/1950,  MRN: [id number]  Chief Complaint  Patient presents with   Foot Pain    Right heel pain     73 y.o. female presents with the above complaint.  Patient presents with right plantar fasciitis hurts with ambulation or shoe pressure patient would like to discuss treatment options for has not seen anyone as prior to seeing me pain scale 7 out of 10 dull aching nature hurts with ambulation.  She would like to discuss treatment options.  She wears good shoes.  She does not wear any orthotics.   Review of Systems: Negative except as noted in the HPI. Denies N/V/F/Ch.  Past Medical History:  Diagnosis Date   Anxiety    Arthritis    Cataract    Depression    Diverticula of colon    sigmoid   Family history of breast cancer    Family history of colon cancer    Family history of lymphoma    Family history of pancreatic cancer    Family history of prostate cancer    Family history of renal cell carcinoma    Family history of stomach cancer    HLD (hyperlipidemia)    Insomnia    Lymphocytic colitis 06/07/2013   Microhematuria 06/2013   stable CT and US , seemed to resolve, released from uro care 03/2014 ([facility])   Polyp, sigmoid colon    hyperplastic   PONV (postoperative nausea and vomiting)    OK after last cataract surgery   Postmenopausal atrophic vaginitis    responded to vagifem   Urine incontinence     Current Outpatient Medications:    hydrocortisone 2.5 % ointment, Apply topically., Disp: , Rfl:    Biotin 5000 MCG CAPS, 1 capsule Orally Once a day, Disp: , Rfl:    celecoxib  (CELEBREX ) 200 MG capsule, Take 1 capsule (200 mg total) by mouth daily., Disp: 90 capsule, Rfl: 1   Cholecalciferol (VITAMIN D3) 25 MCG (1000 UT) CAPS, Take 1 capsule (1,000 Units total) by mouth daily., Disp: 60 capsule, Rfl:    cyanocobalamin  (VITAMIN B12) 1000 MCG tablet, Take 1 tablet (1,000 mcg total) by mouth daily., Disp: , Rfl:     MISC NATURAL PRODUCTS PO, Take by mouth at bedtime. Delta-8, Disp: , Rfl:   Social History   Tobacco Use  Smoking Status Never  Smokeless Tobacco Never    Allergies  Allergen Reactions   Bee Venom    Lunesta  [Eszopiclone ] Other (See Comments)    "Bad taste in mouth"   Melatonin Diarrhea   Objective:  There were no vitals filed for this visit. There is no height or weight on file to calculate BMI. Constitutional Well developed. Well nourished.  Vascular Dorsalis pedis pulses palpable bilaterally. Posterior tibial pulses palpable bilaterally. Capillary refill normal to all digits.  No cyanosis or clubbing noted. Pedal hair growth normal.  Neurologic Normal speech. Oriented to person, place, and time. Epicritic sensation to light touch grossly present bilaterally.  Dermatologic Nails well groomed and normal in appearance. No open wounds. No skin lesions.  Orthopedic: Normal joint ROM without pain or crepitus bilaterally. No visible deformities. Tender to palpation at the calcaneal tuber right. No pain with calcaneal squeeze right. Ankle ROM diminished range of motion right. Silfverskiold Test: positive right.   Radiographs: None  Assessment:   1. Plantar fasciitis, right   2. Pes cavus    Plan:  Patient was evaluated and treated and  all questions answered.  Plantar Fasciitis, right - XR reviewed as above.  - Educated on icing and stretching. Instructions given.  - Injection delivered to the plantar fascia as below. - DME: Plantar fascial brace dispensed to support the medial longitudinal arch of the foot and offload pressure from the heel and prevent arch collapse during weightbearing - Pharmacologic management: None  Pes cavus -I explained to patient the etiology of pes cavus and relationship with Planter fasciitis and various treatment options were discussed.  Given patient foot structure in the setting of Planter fasciitis I believe patient will benefit  from custom-made orthotics to help control the hindfoot motion support the arch of the foot and take the stress away from plantar fascial.  Patient agrees with the plan like to proceed with orthotics -Patient was casted for orthotics   Procedure: Injection Tendon/Ligament Location: Right plantar fascia at the glabrous junction; medial approach. Skin Prep: alcohol Injectate: 0.5 cc 0.5% marcaine plain, 0.5 cc of 1% Lidocaine , 0.5 cc kenalog  10. Disposition: Patient tolerated procedure well. Injection site dressed with a band-aid.  No follow-ups on file.

## 2023-11-10 NOTE — Progress Notes (Signed)
  Orthotic order placed will schedule for fitting when in

## 2023-11-24 ENCOUNTER — Telehealth: Payer: Self-pay

## 2023-11-24 NOTE — Telephone Encounter (Signed)
 PT has appt w doc 6/3 in [place].Aaron Aas orthotics but in box on 5/21

## 2023-11-25 ENCOUNTER — Ambulatory Visit
Admission: RE | Admit: 2023-11-25 | Discharge: 2023-11-25 | Disposition: A | Discharge status: Home / Self Care | Source: Ambulatory Visit | Attending: Family Medicine | Admitting: Family Medicine

## 2023-11-25 ENCOUNTER — Encounter: Payer: Self-pay | Admitting: Internal Medicine

## 2023-11-25 DIAGNOSIS — N63 Unspecified lump in unspecified breast: Secondary | ICD-10-CM

## 2023-11-25 DIAGNOSIS — N6311 Unspecified lump in the right breast, upper outer quadrant: Secondary | ICD-10-CM | POA: Diagnosis not present

## 2023-11-25 DIAGNOSIS — R590 Localized enlarged lymph nodes: Secondary | ICD-10-CM | POA: Diagnosis not present

## 2023-11-26 ENCOUNTER — Ambulatory Visit: Payer: Self-pay | Admitting: Family Medicine

## 2023-11-30 ENCOUNTER — Ambulatory Visit: Payer: Self-pay

## 2023-11-30 ENCOUNTER — Ambulatory Visit (INDEPENDENT_AMBULATORY_CARE_PROVIDER_SITE_OTHER)
Admission: RE | Admit: 2023-11-30 | Discharge: 2023-11-30 | Disposition: A | Discharge status: Home / Self Care | Source: Ambulatory Visit | Attending: Family Medicine | Admitting: Family Medicine

## 2023-11-30 ENCOUNTER — Ambulatory Visit: Payer: Self-pay | Admitting: Family Medicine

## 2023-11-30 ENCOUNTER — Encounter: Payer: Self-pay | Admitting: Family Medicine

## 2023-11-30 ENCOUNTER — Ambulatory Visit (INDEPENDENT_AMBULATORY_CARE_PROVIDER_SITE_OTHER): Admitting: Family Medicine

## 2023-11-30 VITALS — BP 138/78 | HR 76 | Temp 97.6°F | Ht 65.0 in

## 2023-11-30 DIAGNOSIS — M79671 Pain in right foot: Secondary | ICD-10-CM | POA: Diagnosis not present

## 2023-11-30 DIAGNOSIS — M7731 Calcaneal spur, right foot: Secondary | ICD-10-CM | POA: Diagnosis not present

## 2023-11-30 DIAGNOSIS — M722 Plantar fascial fibromatosis: Secondary | ICD-10-CM | POA: Diagnosis not present

## 2023-11-30 MED ORDER — PREDNISONE 20 MG PO TABS
ORAL_TABLET | ORAL | 0 refills | Status: DC
Start: 2023-11-30 — End: 2024-02-07

## 2023-11-30 NOTE — Assessment & Plan Note (Signed)
 Acute, on chronic heel pain. Sharp apin and pop concerning for fascia tear/injury after steroid injeciton. Will eval  with X-ray for avulsion fracture at heel.  Will start prednisone course as pt not interested in increasing celebrex  to BI. Encouraged Ice massage and continue non weight bearing.  If not improving will refer to acute ortho clinic vs order MRI as per pot Dr. Lydia Sams is out of town.

## 2023-11-30 NOTE — Telephone Encounter (Signed)
 Appointment with Dr. Bedsole 11/30/23 at 2:00 pm.

## 2023-11-30 NOTE — Telephone Encounter (Signed)
  Chief Complaint: Right foot pain after 'popping sensation' going downstairs Symptoms: Right foot pain Frequency: since yesterday Pertinent Negatives: Patient denies swelling, bruising Disposition: [] ED /[] Urgent Care (no appt availability in office) / [x] Appointment(In office/virtual)/ []  [facility]/ [] Home Care/ [] Refused Recommended Disposition /[] [facility]/ []  Follow-up with PCP Additional Notes: Patient called in to report she was going down the stairs yesterday and heard a popping sensation in her foot and now has severe pain in her right foot. Patient denies any swelling or bruising, but states she is unable to bear any weight on this foot. Patient offered appt with Dr. Ammie Kallman but patient wanted appt at PCP office for evaluation. Advised to rest, ice, and elevate foot until appt.   Copied from CRM [id number]. Topic: Clinical - Red Word Triage >> Nov 30, 2023  8:14 AM Oddis Bench wrote: Red Word that prompted transfer to Nurse Triage: Patient is calling she is stating that she was coming down her stairs and she heard her foot pop and now she is not able to walk. She also states that she has plantar fiscitus Reason for Disposition  [1] Limp when walking AND [2] due to a twisted ankle or foot  Answer Assessment - Initial Assessment Questions 1. MECHANISM: "How did the injury happen?" (e.g., twisting injury, direct blow)      Walking down stairs and heard a pop 2. ONSET: "When did the injury happen?" (Minutes or hours ago)      yesterday 3. LOCATION: "Where is the injury located?"      Underneath on right side of foot 4. APPEARANCE of INJURY: "What does the injury look like?"      No bruising or swelling 5. WEIGHT-BEARING: "Can you put weight on that foot?" "Can you walk (four steps or more)?"       Unable to put weight on foot 6. SIZE: For cuts, bruises, or swelling, ask: "How large is it?" (e.g., inches or centimeters;  entire joint)      No 7. PAIN: "Is  there pain?" If Yes, ask: "How bad is the pain?"    (e.g., Scale 1-10; or mild, moderate, severe)   - NONE (0): no pain.   - MILD (1-3): doesn't interfere with normal activities.    - MODERATE (4-7): interferes with normal activities (e.g., work or school) or awakens from sleep, limping.    - SEVERE (8-10): excruciating pain, unable to do any normal activities, unable to walk.      8-9 when putting weight on it 9. OTHER SYMPTOMS: "Do you have any other symptoms?"      No  Protocols used: Ankle and Foot Injury-A-AH

## 2023-11-30 NOTE — Patient Instructions (Addendum)
 Ice heel.  Start prednisone taper.  We will call with X-ray results.

## 2023-11-30 NOTE — Progress Notes (Signed)
 Patient ID: Shannon Kelly, female    DOB: 27-Aug-1950, 73 y.o.   MRN: [id number]  This visit was conducted in person.  BP 138/78   Pulse 76   Temp 97.6 F (36.4 C) (Temporal)   Ht 5\' 5"  (1.651 m)   SpO2 97%   BMI 27.15 kg/m    CC:  Chief Complaint  Patient presents with   Foot Pain    Right-Seem podiatrist.  Dx with Plantar Fasciitis-Given injection that lasted about 3 to 4 day.  Was walking down stairs yesterday and heard a pop    Subjective:   HPI: Shannon Kelly is a 73 y.o. female presenting on 11/30/2023 for Foot Pain (Right-[facility] podiatrist.  Dx with Plantar Fasciitis-Given injection that lasted about 3 to 4 day. /Was walking down stairs yesterday and heard a pop)  Ongoing heel pain > 6 months. Recent Dx of plantar fasciitis Dr Lydia Sams.. treated with steroid injection around 10/31/2023 by podiatry  Has follow up next week.  Pain improved x 3-4 days.  Heard a pop when walking a few ... Followed by immediate pain  more lateral bottom of heel.  Unable to weight bear...severe pain with  weight bearing..   No falls or hitting foot.     She is using celebrex  200 mg daily as well as advil PM. She does not want to increase celebrex  to twice daily as she wants to protect kindeys  Relevant past medical, surgical, family and social history reviewed and updated as indicated. Interim medical history since our last visit reviewed. Allergies and medications reviewed and updated. Outpatient Medications Prior to Visit  Medication Sig Dispense Refill   Biotin 5000 MCG CAPS 1 capsule Orally Once a day     celecoxib  (CELEBREX ) 200 MG capsule Take 1 capsule (200 mg total) by mouth daily. 90 capsule 1   Cholecalciferol (VITAMIN D3) 25 MCG (1000 UT) CAPS Take 1 capsule (1,000 Units total) by mouth daily. 60 capsule    cyanocobalamin  (VITAMIN B12) 1000 MCG tablet Take 1 tablet (1,000 mcg total) by mouth daily.     hydrocortisone 2.5 % ointment Apply topically.     MISC NATURAL PRODUCTS PO  Take by mouth at bedtime. Delta-8     No facility-administered medications prior to visit.     Per HPI unless specifically indicated in ROS section below Review of Systems  Constitutional:  Negative for fatigue and fever.  HENT:  Negative for congestion.   Eyes:  Negative for pain.  Respiratory:  Negative for cough and shortness of breath.   Cardiovascular:  Negative for chest pain, palpitations and leg swelling.  Gastrointestinal:  Negative for abdominal pain.  Genitourinary:  Negative for dysuria and vaginal bleeding.  Musculoskeletal:  Negative for back pain.  Neurological:  Negative for syncope, light-headedness and headaches.  Psychiatric/Behavioral:  Negative for dysphoric mood.    Objective:  BP 138/78   Pulse 76   Temp 97.6 F (36.4 C) (Temporal)   Ht 5\' 5"  (1.651 m)   SpO2 97%   BMI 27.15 kg/m   Wt Readings from Last 3 Encounters:  02/02/23 163 lb 2 oz (74 kg)  01/21/23 165 lb (74.8 kg)  01/28/22 157 lb 6 oz (71.4 kg)      Physical Exam Constitutional:      General: She is not in acute distress.    Appearance: Normal appearance. She is well-developed. She is not ill-appearing or toxic-appearing.  HENT:     Head: Normocephalic.  Right Ear: Hearing normal. Tympanic membrane is not erythematous, retracted or bulging.     Left Ear: Hearing normal. Tympanic membrane is not erythematous, retracted or bulging.     Nose: No mucosal edema or rhinorrhea.     Right Sinus: No maxillary sinus tenderness or frontal sinus tenderness.     Left Sinus: No maxillary sinus tenderness or frontal sinus tenderness.     Mouth/Throat:     Pharynx: Uvula midline.  Eyes:     General: Lids are normal. Lids are everted, no foreign bodies appreciated.     Conjunctiva/sclera: Conjunctivae normal.     Pupils: Pupils are equal, round, and reactive to light.  Neck:     Thyroid : No thyroid  mass or thyromegaly.     Vascular: No carotid bruit.     Trachea: Trachea normal.   Cardiovascular:     Pulses: Normal pulses.     Heart sounds: S1 normal and S2 normal.  Pulmonary:     Effort: Pulmonary effort is normal. No tachypnea.     Breath sounds: No decreased breath sounds.  Musculoskeletal:     Cervical back: Normal range of motion and neck supple.     Right foot: Tenderness present. No swelling, deformity or bony tenderness.     Comments: Non tender on sole of foot to palpation but severe pain with standing  Skin:    General: Skin is warm and dry.     Findings: No rash.  Neurological:     Mental Status: She is alert.  Psychiatric:        Mood and Affect: Mood is not anxious or depressed.        Speech: Speech normal.        Behavior: Behavior normal. Behavior is cooperative.        Thought Content: Thought content normal.        Judgment: Judgment normal.       Results for orders placed or performed in visit on 09/15/23  Microalbumin / creatinine urine ratio   Collection Time: 09/16/23  1:51 PM  Result Value Ref Range   Microalb, Ur <0.7 mg/dL   Creatinine,U 19.1 mg/dL   Microalb Creat Ratio Unable to calculate 0.0 - 30.0 mg/g    Assessment and Plan  Pain of right heel Assessment & Plan: Acute, on chronic heel pain. Sharp apin and pop concerning for fascia tear/injury after steroid injeciton. Will eval  with X-ray for avulsion fracture at heel.  Will start prednisone course as pt not interested in increasing celebrex  to BI. Encouraged Ice massage and continue non weight bearing.  If not improving will refer to acute ortho clinic vs order MRI as per pot Dr. Lydia Sams is out of town.  Orders: -     DG Foot Complete Right; Future  Plantar fasciitis  Other orders -     predniSONE; 3 tabs by mouth daily x 3 days, then 2 tabs by mouth daily x 2 days then 1 tab by mouth daily x 2 days  Dispense: 15 tablet; Refill: 0    No follow-ups on file.   Herby Lolling, MD

## 2023-11-30 NOTE — Telephone Encounter (Signed)
 noted

## 2023-12-07 ENCOUNTER — Ambulatory Visit: Admitting: Podiatry

## 2023-12-07 DIAGNOSIS — Q667 Congenital pes cavus, unspecified foot: Secondary | ICD-10-CM

## 2023-12-07 DIAGNOSIS — M722 Plantar fascial fibromatosis: Secondary | ICD-10-CM | POA: Diagnosis not present

## 2023-12-07 NOTE — Progress Notes (Unsigned)
 Subjective:  Patient ID: [name], female    DOB: 05-20-1951,  MRN: [id number]  Chief Complaint  Patient presents with   Foot Pain    73 y.o. female presents with the above complaint.  Patient presents with right plantar fasciitis follow-up she states she is not doing better.  Injection did not help.  She would like to discuss next treatment plan.  She is also here to pick up her orthotics   Review of Systems: Negative except as noted in the HPI. Denies N/V/F/Ch.  Past Medical History:  Diagnosis Date   Anxiety    Arthritis    Cataract    Depression    Diverticula of colon    sigmoid   Family history of breast cancer    Family history of colon cancer    Family history of lymphoma    Family history of pancreatic cancer    Family history of prostate cancer    Family history of renal cell carcinoma    Family history of stomach cancer    HLD (hyperlipidemia)    Insomnia    Lymphocytic colitis 06/07/2013   Microhematuria 06/2013   stable CT and US , seemed to resolve, released from uro care 03/2014 ([facility])   Polyp, sigmoid colon    hyperplastic   PONV (postoperative nausea and vomiting)    OK after last cataract surgery   Postmenopausal atrophic vaginitis    responded to vagifem   Urine incontinence     Current Outpatient Medications:    Biotin 5000 MCG CAPS, 1 capsule Orally Once a day, Disp: , Rfl:    celecoxib  (CELEBREX ) 200 MG capsule, Take 1 capsule (200 mg total) by mouth daily., Disp: 90 capsule, Rfl: 1   Cholecalciferol (VITAMIN D3) 25 MCG (1000 UT) CAPS, Take 1 capsule (1,000 Units total) by mouth daily., Disp: 60 capsule, Rfl:    cyanocobalamin  (VITAMIN B12) 1000 MCG tablet, Take 1 tablet (1,000 mcg total) by mouth daily., Disp: , Rfl:    hydrocortisone 2.5 % ointment, Apply topically., Disp: , Rfl:    MISC NATURAL PRODUCTS PO, Take by mouth at bedtime. Delta-8, Disp: , Rfl:    predniSONE  (DELTASONE ) 20 MG tablet, 3 tabs by mouth daily x 3 days, then 2  tabs by mouth daily x 2 days then 1 tab by mouth daily x 2 days, Disp: 15 tablet, Rfl: 0  Social History   Tobacco Use  Smoking Status Never  Smokeless Tobacco Never    Allergies  Allergen Reactions   Bee Venom    Lunesta  [Eszopiclone ] Other (See Comments)    "Bad taste in mouth"   Melatonin Diarrhea   Objective:  There were no vitals filed for this visit. There is no height or weight on file to calculate BMI. Constitutional Well developed. Well nourished.  Vascular Dorsalis pedis pulses palpable bilaterally. Posterior tibial pulses palpable bilaterally. Capillary refill normal to all digits.  No cyanosis or clubbing noted. Pedal hair growth normal.  Neurologic Normal speech. Oriented to person, place, and time. Epicritic sensation to light touch grossly present bilaterally.  Dermatologic Nails well groomed and normal in appearance. No open wounds. No skin lesions.  Orthopedic: Normal joint ROM without pain or crepitus bilaterally. No visible deformities. Tender to palpation at the calcaneal tuber right. No pain with calcaneal squeeze right. Ankle ROM diminished range of motion right. Silfverskiold Test: positive right.   Radiographs: None  Assessment:   1. Plantar fasciitis, right   2. Pes cavus  Plan:  Patient was evaluated and treated and all questions answered.  Plantar Fasciitis, right - XR reviewed as above.  - Educated on icing and stretching. Instructions given.  - No further injection as previous injection did not help - DME: Cam boot immobilization dispensed. - Pharmacologic management: None  Pes cavus -I explained to patient the etiology of pes cavus and relationship with Planter fasciitis and various treatment options were discussed.  Given patient foot structure in the setting of Planter fasciitis I believe patient will benefit from custom-made orthotics to help control the hindfoot motion support the arch of the foot and take the stress away  from plantar fascial.  Patient agrees with the plan like to proceed with orthotics - Orthotics were dispensed.  Functioning well break-in.  Discussed  No follow-ups on file.

## 2024-01-04 ENCOUNTER — Ambulatory Visit: Admitting: Podiatry

## 2024-01-04 DIAGNOSIS — M722 Plantar fascial fibromatosis: Secondary | ICD-10-CM

## 2024-01-04 NOTE — Progress Notes (Unsigned)
 Subjective:  Patient ID: Shannon Kelly, female    DOB: Jul 03, 1951,  MRN: [id number]  Chief Complaint  Patient presents with   Plantar Fasciitis    Pt stated that she is doing better pain wise but she still gets some swelling     73 y.o. female presents with the above complaint.  Patient presents with right plantar fasciitis follow-up sh this is a little bit better.  She still gets some swelling and pain with it.  She would like to discuss next treatment plan   Review of Systems: Negative except as noted in the HPI. Denies N/V/F/Ch.  Past Medical History:  Diagnosis Date   Anxiety    Arthritis    Cataract    Depression    Diverticula of colon    sigmoid   Family history of breast cancer    Family history of colon cancer    Family history of lymphoma    Family history of pancreatic cancer    Family history of prostate cancer    Family history of renal cell carcinoma    Family history of stomach cancer    HLD (hyperlipidemia)    Insomnia    Lymphocytic colitis 06/07/2013   Microhematuria 06/2013   stable CT and US , seemed to resolve, released from uro care 03/2014 ([facility])   Polyp, sigmoid colon    hyperplastic   PONV (postoperative nausea and vomiting)    OK after last cataract surgery   Postmenopausal atrophic vaginitis    responded to vagifem   Urine incontinence     Current Outpatient Medications:    Biotin 5000 MCG CAPS, 1 capsule Orally Once a day, Disp: , Rfl:    celecoxib  (CELEBREX ) 200 MG capsule, Take 1 capsule (200 mg total) by mouth daily., Disp: 90 capsule, Rfl: 1   Cholecalciferol (VITAMIN D3) 25 MCG (1000 UT) CAPS, Take 1 capsule (1,000 Units total) by mouth daily., Disp: 60 capsule, Rfl:    cyanocobalamin  (VITAMIN B12) 1000 MCG tablet, Take 1 tablet (1,000 mcg total) by mouth daily., Disp: , Rfl:    hydrocortisone 2.5 % ointment, Apply topically., Disp: , Rfl:    MISC NATURAL PRODUCTS PO, Take by mouth at bedtime. Delta-8, Disp: , Rfl:    predniSONE   (DELTASONE ) 20 MG tablet, 3 tabs by mouth daily x 3 days, then 2 tabs by mouth daily x 2 days then 1 tab by mouth daily x 2 days, Disp: 15 tablet, Rfl: 0  Social History   Tobacco Use  Smoking Status Never  Smokeless Tobacco Never    Allergies  Allergen Reactions   Bee Venom    Lunesta  [Eszopiclone ] Other (See Comments)    Bad taste in mouth   Melatonin Diarrhea   Objective:  There were no vitals filed for this visit. There is no height or weight on file to calculate BMI. Constitutional Well developed. Well nourished.  Vascular Dorsalis pedis pulses palpable bilaterally. Posterior tibial pulses palpable bilaterally. Capillary refill normal to all digits.  No cyanosis or clubbing noted. Pedal hair growth normal.  Neurologic Normal speech. Oriented to person, place, and time. Epicritic sensation to light touch grossly present bilaterally.  Dermatologic Nails well groomed and normal in appearance. No open wounds. No skin lesions.  Orthopedic: Normal joint ROM without pain or crepitus bilaterally. No visible deformities. Tender to palpation at the calcaneal tuber right. No pain with calcaneal squeeze right. Ankle ROM diminished range of motion right. Silfverskiold Test: positive right.   Radiographs: None  Assessment:  No diagnosis found.   Plan:  Patient was evaluated and treated and all questions answered.  Plantar Fasciitis, right - XR reviewed as above.  - Educated on icing and stretching. Instructions given.  - No further injection as previous injection did not help - DME: Continue cam boot immobilization as needed - Pharmacologic management: None - I discussed with the patient extensively about PRP injection physical therapy or surgical options.  She will get back to me when she is ready.  She is  Pes cavus -I explained to patient the etiology of pes cavus and relationship with Planter fasciitis and various treatment options were discussed.  Given  patient foot structure in the setting of Planter fasciitis I believe patient will benefit from custom-made orthotics to help control the hindfoot motion support the arch of the foot and take the stress away from plantar fascial.  Patient agrees with the plan like to proceed with orthotics - Orthotics were dispensed.  Functioning well break-in.  Discussed  No follow-ups on file.

## 2024-01-25 ENCOUNTER — Telehealth: Payer: Self-pay | Admitting: Family Medicine

## 2024-01-25 NOTE — Telephone Encounter (Signed)
 Copied from CRM [id number]. Topic: Appointments - Scheduling Inquiry for Clinic >> Jan 25, 2024  2:55 PM Shannon Kelly wrote: Reason for CRM: Patient called in regarding rescheduling Medicare Annual Wellness appointment, patient stated that she needs to have that schedule before she sees  Dr.Gutierrez on 08/04 , but next soonest appointment isnt until September

## 2024-01-29 ENCOUNTER — Other Ambulatory Visit: Payer: Self-pay | Admitting: Family Medicine

## 2024-01-29 DIAGNOSIS — E782 Mixed hyperlipidemia: Secondary | ICD-10-CM

## 2024-01-29 DIAGNOSIS — N1831 Chronic kidney disease, stage 3a: Secondary | ICD-10-CM

## 2024-01-29 DIAGNOSIS — M8580 Other specified disorders of bone density and structure, unspecified site: Secondary | ICD-10-CM

## 2024-01-31 ENCOUNTER — Other Ambulatory Visit (INDEPENDENT_AMBULATORY_CARE_PROVIDER_SITE_OTHER): Payer: PPO

## 2024-01-31 DIAGNOSIS — M8580 Other specified disorders of bone density and structure, unspecified site: Secondary | ICD-10-CM

## 2024-01-31 DIAGNOSIS — N1831 Chronic kidney disease, stage 3a: Secondary | ICD-10-CM | POA: Diagnosis not present

## 2024-01-31 DIAGNOSIS — E782 Mixed hyperlipidemia: Secondary | ICD-10-CM | POA: Diagnosis not present

## 2024-01-31 LAB — CBC WITH DIFFERENTIAL/PLATELET
Basophils Absolute: 0 K/uL (ref 0.0–0.1)
Basophils Relative: 0.6 % (ref 0.0–3.0)
Eosinophils Absolute: 0.2 K/uL (ref 0.0–0.7)
Eosinophils Relative: 3.3 % (ref 0.0–5.0)
HCT: 38.1 % (ref 36.0–46.0)
Hemoglobin: 12.7 g/dL (ref 12.0–15.0)
Lymphocytes Relative: 31.6 % (ref 12.0–46.0)
Lymphs Abs: 1.7 K/uL (ref 0.7–4.0)
MCHC: 33.3 g/dL (ref 30.0–36.0)
MCV: 90.6 fl (ref 78.0–100.0)
Monocytes Absolute: 0.5 K/uL (ref 0.1–1.0)
Monocytes Relative: 8.6 % (ref 3.0–12.0)
Neutro Abs: 3 K/uL (ref 1.4–7.7)
Neutrophils Relative %: 55.9 % (ref 43.0–77.0)
Platelets: 301 K/uL (ref 150.0–400.0)
RBC: 4.2 Mil/uL (ref 3.87–5.11)
RDW: 13.7 % (ref 11.5–15.5)
WBC: 5.3 K/uL (ref 4.0–10.5)

## 2024-01-31 LAB — COMPREHENSIVE METABOLIC PANEL WITH GFR
ALT: 16 U/L (ref 0–35)
AST: 15 U/L (ref 0–37)
Albumin: 4.2 g/dL (ref 3.5–5.2)
Alkaline Phosphatase: 62 U/L (ref 39–117)
BUN: 18 mg/dL (ref 6–23)
CO2: 25 meq/L (ref 19–32)
Calcium: 9.3 mg/dL (ref 8.4–10.5)
Chloride: 105 meq/L (ref 96–112)
Creatinine, Ser: 0.9 mg/dL (ref 0.40–1.20)
GFR: 63.93 mL/min (ref >60.00)
Glucose, Bld: 87 mg/dL (ref 70–99)
Potassium: 4.1 meq/L (ref 3.5–5.1)
Sodium: 140 meq/L (ref 135–145)
Total Bilirubin: 0.6 mg/dL (ref 0.2–1.2)
Total Protein: 7.1 g/dL (ref 6.0–8.3)

## 2024-01-31 LAB — PHOSPHORUS: Phosphorus: 3.5 mg/dL (ref 2.3–4.6)

## 2024-01-31 LAB — MICROALBUMIN / CREATININE URINE RATIO
Creatinine,U: 86.5 mg/dL
Microalb Creat Ratio: UNDETERMINED mg/g (ref 0.0–30.0)
Microalb, Ur: <0.7 mg/dL

## 2024-01-31 LAB — LIPID PANEL
Cholesterol: 202 mg/dL — ABNORMAL HIGH (ref 0–200)
HDL: 50.2 mg/dL (ref >39.00)
LDL Cholesterol: 124 mg/dL — ABNORMAL HIGH (ref 0–99)
NonHDL: 151.69
Total CHOL/HDL Ratio: 4
Triglycerides: 139 mg/dL (ref 0.0–149.0)
VLDL: 27.8 mg/dL (ref 0.0–40.0)

## 2024-02-01 LAB — PARATHYROID HORMONE, INTACT (NO CA): PTH: 14 pg/mL — ABNORMAL LOW (ref 16–77)

## 2024-02-01 LAB — VITAMIN D 25 HYDROXY (VIT D DEFICIENCY, FRACTURES): VITD: 34 ng/mL (ref 30.00–100.00)

## 2024-02-02 ENCOUNTER — Ambulatory Visit: Payer: Self-pay | Admitting: Family Medicine

## 2024-02-07 ENCOUNTER — Encounter: Payer: Self-pay | Admitting: Family Medicine

## 2024-02-07 ENCOUNTER — Ambulatory Visit (INDEPENDENT_AMBULATORY_CARE_PROVIDER_SITE_OTHER): Payer: PPO | Admitting: Family Medicine

## 2024-02-07 VITALS — BP 158/90 | HR 74 | Temp 98.2°F | Ht 65.0 in | Wt 167.0 lb

## 2024-02-07 DIAGNOSIS — Z0000 Encounter for general adult medical examination without abnormal findings: Secondary | ICD-10-CM

## 2024-02-07 DIAGNOSIS — M150 Primary generalized (osteo)arthritis: Secondary | ICD-10-CM | POA: Diagnosis not present

## 2024-02-07 DIAGNOSIS — R030 Elevated blood-pressure reading, without diagnosis of hypertension: Secondary | ICD-10-CM | POA: Insufficient documentation

## 2024-02-07 DIAGNOSIS — N1831 Chronic kidney disease, stage 3a: Secondary | ICD-10-CM

## 2024-02-07 DIAGNOSIS — F5104 Psychophysiologic insomnia: Secondary | ICD-10-CM | POA: Diagnosis not present

## 2024-02-07 DIAGNOSIS — Z7189 Other specified counseling: Secondary | ICD-10-CM | POA: Insufficient documentation

## 2024-02-07 DIAGNOSIS — E782 Mixed hyperlipidemia: Secondary | ICD-10-CM

## 2024-02-07 DIAGNOSIS — Z23 Encounter for immunization: Secondary | ICD-10-CM

## 2024-02-07 DIAGNOSIS — M8580 Other specified disorders of bone density and structure, unspecified site: Secondary | ICD-10-CM | POA: Diagnosis not present

## 2024-02-07 MED ORDER — AMLODIPINE BESYLATE 5 MG PO TABS: 5.0000 mg | ORAL_TABLET | Freq: Every day | ORAL | 3 refills | Status: DC

## 2024-02-07 MED ORDER — CELECOXIB 200 MG PO CAPS: 200.0000 mg | ORAL_CAPSULE | Freq: Every day | ORAL | 1 refills | Status: DC

## 2024-02-07 NOTE — Assessment & Plan Note (Signed)
 Trouble with sleep maintenance insomnia - discussed possible silenor vs L-theanine.

## 2024-02-07 NOTE — Patient Instructions (Addendum)
 Prevnar-20 today  BP staying too high - back off of advil PM use.  Monitor blood pressures at home and keep track on log. If consistently >150/90, start BP medicine amlodipine  5mg  daily - prescription printed out today  Consider silenor (doxepin) for sleep (prescription). Could try over the counter L-theanine 200mg  as needed for night time awakenings.  You are due for repeat colonoscopy.  Continue calcium in diet and vitamin D .  Consider updating [facility] shot at [facility].  Good to see you today Return in 4-6 weeks for blood pressure follow up.  Return as needed or in 1 year for next physical  Your goal blood pressure is <140/90. Work on low salt/sodium diet - goal <2 grams (2,000mg ) per day. Eat a diet high in fruits/vegetables and whole grains.  Look into mediterranean and DASH diet. Goal activity is 142min/wk of moderate intensity exercise.  This can be split into 30 minute chunks.  If you are not at this level, you can start with smaller 10-15 min increments and slowly build up activity. Look at www.heart.org for more resources

## 2024-02-07 NOTE — Assessment & Plan Note (Signed)
Advanced directives: received and scanned into chart. Husband Jaquelyn Bitter then son Gaspar Bidding are HCPOA. Does not want life prolonging measures if terminal condition.

## 2024-02-07 NOTE — Assessment & Plan Note (Signed)
 Preventative protocols reviewed and updated unless pt declined. Discussed healthy diet and lifestyle.

## 2024-02-07 NOTE — Assessment & Plan Note (Signed)

## 2024-02-07 NOTE — Assessment & Plan Note (Addendum)
 Latest GFR actually improved. Continue to monitor.

## 2024-02-07 NOTE — Assessment & Plan Note (Addendum)
 Chronic off medication. Reviewed diet choices to improve cholesterol control.  The 10-year ASCVD risk score (Arnett DK, et al., 2019) is: 17.4%   Values used to calculate the score:     Age: 73 years     Clincally relevant sex: Female     Is Non-Hispanic African American: No     Diabetic: No     Tobacco smoker: No     Systolic Blood Pressure: 158 mmHg     Is BP treated: No     HDL Cholesterol: 50.2 mg/dL     Total Cholesterol: 202 mg/dL

## 2024-02-07 NOTE — Assessment & Plan Note (Signed)
 New elevation, in setting of increased NSAID use. 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  BP log sheet provided - she will start monitoring BP at home and start amlodipine  (WASP printed) if remaining consistently high  DASH diet provided RTC 4-6 wks HTN f/u visit

## 2024-02-07 NOTE — Progress Notes (Signed)
 Ph: [phone] Fax: [phone]   Patient ID: Shannon Kelly, female    DOB: 1951/03/02, 73 y.o.   MRN: [id number]  This visit was conducted in person.  BP (!) 158/90   Pulse 74   Temp 98.2 F (36.8 C) (Oral)   Ht 5' 5 (1.651 m)   Wt 167 lb (75.8 kg)   SpO2 98%   BMI 27.79 kg/m   BP Readings from Last 3 Encounters:  02/07/24 (!) 158/90  11/30/23 138/78  02/02/23 128/80   CC: AMW/CPE Subjective:   HPI: Shannon Kelly is a 73 y.o. female presenting on 02/07/2024 for Annual Exam   Diod not see health advisor this year.   No results found.  Flowsheet Row Office Visit from 02/07/2024 in [facility] [place]  PHQ-2 Total Score 0       02/07/2024    9:40 AM 11/30/2023    2:09 PM 01/21/2023    3:17 PM 01/28/2022   10:00 AM 01/15/2020    9:25 AM  Fall Risk   Falls in the past year? 0 0 0 0 0  Number falls in past yr:  0 0    Injury with Fall?  0 0    Risk for fall due to :  No Fall Risks No Fall Risks    Follow up  Falls evaluation completed Falls prevention discussed;Falls evaluation completed     R foot plantar fasciitis saw podiatry s/p steroid injection 10/2023, also wore boot for 6 wks. Fascia tore 11/2023 while walking down stairs. Placed on prednisone  and followed up with podiatry. Recommending surgery vs PRP injection.  Taking advil PM 2 at night. Previously on delta-8 but caused dry mouth. Melatonin caused diarrhea.   Preventative: COLONOSCOPY 09/2018 - diverticulosis, rpt 5 yrs due to fmhx Ollen) - considering for this fall Well woman - with OBGYN prior in [place] (Dr. Schuyler Norrie). S/p vaginal hysterectomy 1993 for fibroids, ovaries remain. Denies pelvic pain or pressure, vaginal bleeding, skin changes.  [facility]  DEXA 02/2023 - T -1.2 RFN, -1.0 L forearm, not at increased fracture risk  Good milk intake, leafy greens, regular weight bearing exercises.  Lung cancer screening - not eligible   Flu shot yearly COVID vaccine - Pfizer 08/2019, 09/2019 and booster 07/2020, bivalent 04/2021 Prevnar-13 2018, pneumovax-23 2019, prevnar-20 today Tdap - 08/2013  zostavax - 05/2012 Shingrix - 01/29/2022, 03/2022  RSV - discussed Advanced directives: received and scanned into chart. Husband Jackee then son Dorise are HCPOA. Does not want life prolonging measures if terminal condition.  Seat belt use discussed.  Sunscreen use discussed.  No changing moles on skin. Sees dermatologist. Sleep - averaging 6.5 hours/night Non smoker  Alcohol - rare Dentist - q4 mo for periodontal disease  Eye exam Q1-2 yrs  Bowel - no constipation  Bladder - occasional urge incontinence s/p urethral sling    Lives with husband, married, 1 son Had been living in [place], return to [place] fall 2014 Occupation: Retired, prior worked at [employer]: [employer] Activity: outdoor walking, joined gym through [employer] - going to [employer] since 10/2019 Diet: good water, fruits/vegetables daily     Relevant past medical, surgical, family and social history reviewed and updated as indicated. Interim medical history since our last visit reviewed. Allergies and medications reviewed and updated. Outpatient Medications Prior to Visit  Medication Sig Dispense Refill   Cholecalciferol (VITAMIN D3) 25 MCG (1000  UT) CAPS Take 1 capsule (1,000 Units total) by mouth daily. 60 capsule    hydrocortisone 2.5 % ointment Apply topically.     MISC NATURAL PRODUCTS PO Take by mouth at bedtime. Delta-8     celecoxib  (CELEBREX ) 200 MG capsule Take 1 capsule (200 mg total) by mouth daily. 90 capsule 1   Ibuprofen (ADVIL PO) Take by mouth at bedtime.     Biotin 5000 MCG CAPS 1 capsule Orally Once a day     cyanocobalamin  (VITAMIN B12) 1000 MCG tablet Take 1 tablet (1,000 mcg total) by mouth daily.     predniSONE  (DELTASONE ) 20 MG tablet 3 tabs by mouth daily x 3 days, then 2 tabs by mouth daily x 2 days then 1 tab  by mouth daily x 2 days 15 tablet 0   No facility-administered medications prior to visit.     Per HPI unless specifically indicated in ROS section below Review of Systems  Constitutional:  Negative for activity change, appetite change, chills, fatigue, fever and unexpected weight change.  HENT:  Negative for hearing loss.   Eyes:  Negative for visual disturbance.  Respiratory:  Positive for cough (occ). Negative for chest tightness, shortness of breath and wheezing.   Cardiovascular:  Negative for chest pain, palpitations and leg swelling.  Gastrointestinal:  Negative for abdominal distention, abdominal pain, blood in stool, constipation, diarrhea, nausea and vomiting.  Genitourinary:  Negative for difficulty urinating and hematuria.  Musculoskeletal:  Negative for arthralgias, myalgias and neck pain.  Skin:  Negative for rash.  Neurological:  Negative for dizziness, seizures, syncope and headaches.  Hematological:  Negative for adenopathy. Bruises/bleeds easily.  Psychiatric/Behavioral:  Negative for dysphoric mood. The patient is not nervous/anxious.     Objective:  BP (!) 158/90   Pulse 74   Temp 98.2 F (36.8 C) (Oral)   Ht 5' 5 (1.651 m)   Wt 167 lb (75.8 kg)   SpO2 98%   BMI 27.79 kg/m   Wt Readings from Last 3 Encounters:  02/07/24 167 lb (75.8 kg)  02/02/23 163 lb 2 oz (74 kg)  01/21/23 165 lb (74.8 kg)      Physical Exam Vitals and nursing note reviewed.  Constitutional:      Appearance: Normal appearance. She is not ill-appearing.  HENT:     Head: Normocephalic and atraumatic.     Right Ear: Tympanic membrane, ear canal and external ear normal. There is no impacted cerumen.     Left Ear: Tympanic membrane, ear canal and external ear normal. There is no impacted cerumen.  Eyes:     General:        Right eye: No discharge.        Left eye: No discharge.     Extraocular Movements: Extraocular movements intact.     Conjunctiva/sclera: Conjunctivae normal.      Pupils: Pupils are equal, round, and reactive to light.  Neck:     Thyroid : No thyroid  mass or thyromegaly.  Cardiovascular:     Rate and Rhythm: Normal rate and regular rhythm.     Pulses: Normal pulses.     Heart sounds: Normal heart sounds. No murmur heard. Pulmonary:     Effort: Pulmonary effort is normal. No respiratory distress.     Breath sounds: Normal breath sounds. No wheezing, rhonchi or rales.  Abdominal:     General: Bowel sounds are normal. There is no distension.     Palpations: Abdomen is soft. There is no mass.  Tenderness: There is no abdominal tenderness. There is no guarding or rebound.     Hernia: No hernia is present.  Musculoskeletal:     Cervical back: Normal range of motion and neck supple. No rigidity.     Right lower leg: No edema.     Left lower leg: No edema.  Lymphadenopathy:     Cervical: No cervical adenopathy.  Skin:    General: Skin is warm and dry.     Findings: No rash.  Neurological:     General: No focal deficit present.     Mental Status: She is alert. Mental status is at baseline.  Psychiatric:        Mood and Affect: Mood normal.        Behavior: Behavior normal.       Results for orders placed or performed in visit on 01/31/24  CBC with Differential/Platelet   Collection Time: 01/31/24  7:46 AM  Result Value Ref Range   WBC 5.3 4.0 - 10.5 K/uL   RBC 4.20 3.87 - 5.11 Mil/uL   Hemoglobin 12.7 12.0 - 15.0 g/dL   HCT 61.8 63.9 - 53.9 %   MCV 90.6 78.0 - 100.0 fl   MCHC 33.3 30.0 - 36.0 g/dL   RDW 86.2 88.4 - 84.4 %   Platelets 301.0 150.0 - 400.0 K/uL   Neutrophils Relative % 55.9 43.0 - 77.0 %   Lymphocytes Relative 31.6 12.0 - 46.0 %   Monocytes Relative 8.6 3.0 - 12.0 %   Eosinophils Relative 3.3 0.0 - 5.0 %   Basophils Relative 0.6 0.0 - 3.0 %   Neutro Abs 3.0 1.4 - 7.7 K/uL   Lymphs Abs 1.7 0.7 - 4.0 K/uL   Monocytes Absolute 0.5 0.1 - 1.0 K/uL   Eosinophils Absolute 0.2 0.0 - 0.7 K/uL   Basophils Absolute 0.0 0.0 -  0.1 K/uL  Parathyroid  hormone, intact (no Ca)   Collection Time: 01/31/24  7:46 AM  Result Value Ref Range   PTH 14 (L) 16 - 77 pg/mL  Microalbumin / creatinine urine ratio   Collection Time: 01/31/24  7:46 AM  Result Value Ref Range   Microalb, Ur <0.7 mg/dL   Creatinine,U 13.4 mg/dL   Microalb Creat Ratio Unable to calculate 0.0 - 30.0 mg/g  VITAMIN D  25 Hydroxy (Vit-D Deficiency, Fractures)   Collection Time: 01/31/24  7:46 AM  Result Value Ref Range   VITD 34.00 30.00 - 100.00 ng/mL  Phosphorus   Collection Time: 01/31/24  7:46 AM  Result Value Ref Range   Phosphorus 3.5 2.3 - 4.6 mg/dL  Comprehensive metabolic panel with GFR   Collection Time: 01/31/24  7:46 AM  Result Value Ref Range   Sodium 140 135 - 145 mEq/L   Potassium 4.1 3.5 - 5.1 mEq/L   Chloride 105 96 - 112 mEq/L   CO2 25 19 - 32 mEq/L   Glucose, Bld 87 70 - 99 mg/dL   BUN 18 6 - 23 mg/dL   Creatinine, Ser 9.09 0.40 - 1.20 mg/dL   Total Bilirubin 0.6 0.2 - 1.2 mg/dL   Alkaline Phosphatase 62 39 - 117 U/L   AST 15 0 - 37 U/L   ALT 16 0 - 35 U/L   Total Protein 7.1 6.0 - 8.3 g/dL   Albumin 4.2 3.5 - 5.2 g/dL   GFR 36.06 >39.99 mL/min   Calcium 9.3 8.4 - 10.5 mg/dL  Lipid panel   Collection Time: 01/31/24  7:46 AM  Result Value  Ref Range   Cholesterol 202 (H) 0 - 200 mg/dL   Triglycerides 860.9 0.0 - 149.0 mg/dL   HDL 49.79 >60.99 mg/dL   VLDL 72.1 0.0 - 59.9 mg/dL   LDL Cholesterol 875 (H) 0 - 99 mg/dL   Total CHOL/HDL Ratio 4    NonHDL 151.69     Assessment & Plan:   Problem List Items Addressed This Visit     Health maintenance examination (Chronic)   Preventative protocols reviewed and updated unless pt declined. Discussed healthy diet and lifestyle.       Medicare annual wellness visit, subsequent - Primary (Chronic)   I have personally reviewed the Medicare Annual Wellness questionnaire and have noted 1. The patient's medical and social history 2. Their use of alcohol, tobacco or illicit  drugs 3. Their current medications and supplements 4. The patient's functional ability including ADL's, fall risks, home safety risks and hearing or visual impairment. Cognitive function has been assessed and addressed as indicated.  5. Diet and physical activity 6. Evidence for depression or mood disorders The patients weight, height, BMI have been recorded in the chart. I have made referrals, counseling and provided education to the patient based on review of the above and I have provided the pt with a written personalized care plan for preventive services. Provider list updated.. See scanned questionairre as needed for further documentation. Reviewed preventative protocols and updated unless pt declined.       Advanced directives, counseling/discussion (Chronic)   Advanced directives: received and scanned into chart. Husband Jackee then son Dorise are HCPOA. Does not want life prolonging measures if terminal condition.       Osteoarthritis   Relevant Medications   celecoxib  (CELEBREX ) 200 MG capsule   HLD (hyperlipidemia)   Chronic off medication. Reviewed diet choices to improve cholesterol control.  The 10-year ASCVD risk score (Arnett DK, et al., 2019) is: 17.4%   Values used to calculate the score:     Age: 58 years     Clincally relevant sex: Female     Is Non-Hispanic African American: No     Diabetic: No     Tobacco smoker: No     Systolic Blood Pressure: 158 mmHg     Is BP treated: No     HDL Cholesterol: 50.2 mg/dL     Total Cholesterol: 202 mg/dL       Relevant Medications   amLODipine  (NORVASC ) 5 MG tablet   Chronic insomnia   Trouble with sleep maintenance insomnia - discussed possible silenor vs L-theanine.       CKD (chronic kidney disease) stage 3, GFR 30-59 ml/min (HCC)   Latest GFR actually improved. Continue to monitor.       Osteopenia   Continue good calcium intake, daily vit D replacement Weight bearing exercise currently limited due to foot  issues.       Elevated blood pressure reading in office without diagnosis of hypertension   New elevation, in setting of increased NSAID use. 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  BP log sheet provided - she will start monitoring BP at home and start amlodipine  (WASP printed) if remaining consistently high  DASH diet provided RTC 4-6 wks HTN f/u visit       Other Visit Diagnoses       Need for vaccination against Streptococcus pneumoniae       Relevant Orders   Pneumococcal conjugate vaccine 20-valent (Prevnar 20) (Completed)        Meds ordered this  encounter  Medications   celecoxib  (CELEBREX ) 200 MG capsule    Sig: Take 1 capsule (200 mg total) by mouth daily.    Dispense:  90 capsule    Refill:  1   amLODipine  (NORVASC ) 5 MG tablet    Sig: Take 1 tablet (5 mg total) by mouth daily.    Dispense:  90 tablet    Refill:  3    Orders Placed This Encounter  Procedures   Pneumococcal conjugate vaccine 20-valent (Prevnar 20)    Patient Instructions  Prevnar-20 today  BP staying too high - back off of advil PM use.  Monitor blood pressures at home and keep track on log. If consistently >150/90, start BP medicine amlodipine  5mg  daily - prescription printed out today  Consider silenor (doxepin) for sleep (prescription). Could try over the counter L-theanine 200mg  as needed for night time awakenings.  You are due for repeat colonoscopy.  Continue calcium in diet and vitamin D .  Consider updating tetanus shot at [facility].  Good to see you today Return in 4-6 weeks for blood pressure follow up.  Return as needed or in 1 year for next physical  Your goal blood pressure is <140/90. Work on low salt/sodium diet - goal <2 grams (2,000mg ) per day. Eat a diet high in fruits/vegetables and whole grains.  Look into mediterranean and DASH diet. Goal activity is 119min/wk of moderate intensity exercise.  This can be split into 30 minute chunks.  If you are not at this  level, you can start with smaller 10-15 min increments and slowly build up activity. Look at www.heart.org for more resources   Follow up plan: Return in about 1 year (around 02/06/2025), or if symptoms worsen or fail to improve, for annual exam, prior fasting for blood work, medicare wellness visit.  Anton Blas, MD

## 2024-02-07 NOTE — Assessment & Plan Note (Signed)
 Continue good calcium intake, daily vit D replacement Weight bearing exercise currently limited due to foot issues.

## 2024-03-13 ENCOUNTER — Ambulatory Visit (INDEPENDENT_AMBULATORY_CARE_PROVIDER_SITE_OTHER): Admitting: Family Medicine

## 2024-03-13 ENCOUNTER — Encounter: Payer: Self-pay | Admitting: Family Medicine

## 2024-03-13 VITALS — BP 128/73 | HR 61 | Temp 98.0°F | Wt 169.4 lb

## 2024-03-13 DIAGNOSIS — F5104 Psychophysiologic insomnia: Secondary | ICD-10-CM | POA: Diagnosis not present

## 2024-03-13 DIAGNOSIS — R030 Elevated blood-pressure reading, without diagnosis of hypertension: Secondary | ICD-10-CM | POA: Diagnosis not present

## 2024-03-13 MED ORDER — DOXEPIN HCL 3 MG PO TABS: 3.0000 mg | ORAL_TABLET | Freq: Every evening | ORAL | 1 refills | Status: DC

## 2024-03-13 NOTE — Assessment & Plan Note (Addendum)
 Persistent elevation again noted in office - however home readings largely well controlled. ?component of white coat hypertension. I did ask her to bring in home BP cuff to next visit for comparison.  Again reviewed diet and lifestyle choices to keep blood pressure under control.  RTC 3 mo HTN recheck.  DASH diet previously provided.  She did not try amlodipine  - will stay off at this time.

## 2024-03-13 NOTE — Assessment & Plan Note (Addendum)
 Ongoing difficulty.  Tried and failed multiple meds in the past. Will trial silenor  3mg  nightly. Consider CBT-I.  Reassess at f/u visit.

## 2024-03-13 NOTE — Patient Instructions (Addendum)
 Blood pressures at home are running well controlled - continue low salt/sodium diet to keep good control. Watch anti inflammatory medicines.  For cough - try pepcid 20mg  at night time or a short course of Prilosec -OTC. Limit spicy foods, caffeine.  For sleep - trial silenor  3mg  nightly for insomnia.  Bring in home BP cuff to next visit.  Return in 3 months for BP and sleep follow up

## 2024-03-13 NOTE — Progress Notes (Signed)
 Ph: [phone] Fax: [phone]   Patient ID: Shannon Kelly, female    DOB: 1950/09/26, 73 y.o.   MRN: [id number]  This visit was conducted in person.  BP 128/73 Comment: with home bp cuff this morning  Pulse 61   Temp 98 F (36.7 C) (Oral)   Wt 169 lb 6 oz (76.8 kg)   SpO2 97%   BMI 28.19 kg/m   BP Readings from Last 3 Encounters:  03/13/24 128/73  02/07/24 (!) 158/90  11/30/23 138/78  Elevated on retesting bilaterally  Home readings all well controlled , latest 128/73 this morning  CC: 4-6 wk f/u visit  Subjective:   HPI: Shannon Kelly is a 73 y.o. female presenting on 03/13/2024 for Medical Management of Chronic Issues (Pt here for 4-6 wk f/u )   Chronic R foot pain - started laser light therapy twice monthly, will see if this is helpful. Seeing chiropractor Darice Liverpool.  She also continues celebrex  200mg  daily, and advil pm nightly.   Ongoing sleep maintenance > initiation insomnia. Multiple meds tried without benefit. Delta 8 gummies help sleep but cause dry mouth.   HTN - Compliant with current antihypertensive regimen of - diet control. Did not try amlodipine . ?white coat HTN. Does check blood pressures at home and brings log as per below. 110s-130s/70-90. No low blood pressure readings or symptoms of dizziness/syncope. Denies HA, vision changes, CP/tightness, SOB, leg swelling.        Relevant past medical, surgical, family and social history reviewed and updated as indicated. Interim medical history since our last visit reviewed. Allergies and medications reviewed and updated. Outpatient Medications Prior to Visit  Medication Sig Dispense Refill   celecoxib  (CELEBREX ) 200 MG capsule Take 1 capsule (200 mg total) by mouth daily. 90 capsule 1   Cholecalciferol (VITAMIN D3) 25 MCG (1000 UT) CAPS Take 1 capsule (1,000 Units total) by mouth daily. 60 capsule    hydrocortisone 2.5 % ointment Apply topically.     MISC NATURAL PRODUCTS PO Take by mouth at  bedtime. Delta-8     amLODipine  (NORVASC ) 5 MG tablet Take 1 tablet (5 mg total) by mouth daily. (Patient not taking: Reported on 03/13/2024) 90 tablet 3   No facility-administered medications prior to visit.     Per HPI unless specifically indicated in ROS section below Review of Systems  Objective:  BP 128/73 Comment: with home bp cuff this morning  Pulse 61   Temp 98 F (36.7 C) (Oral)   Wt 169 lb 6 oz (76.8 kg)   SpO2 97%   BMI 28.19 kg/m   Wt Readings from Last 3 Encounters:  03/13/24 169 lb 6 oz (76.8 kg)  02/07/24 167 lb (75.8 kg)  02/02/23 163 lb 2 oz (74 kg)      Physical Exam Vitals and nursing note reviewed.  Constitutional:      Appearance: Normal appearance. She is not ill-appearing.  HENT:     Head: Normocephalic and atraumatic.     Mouth/Throat:     Mouth: Mucous membranes are moist.     Pharynx: Oropharynx is clear. No oropharyngeal exudate or posterior oropharyngeal erythema.  Eyes:     Extraocular Movements: Extraocular movements intact.     Pupils: Pupils are equal, round, and reactive to light.  Cardiovascular:     Rate and Rhythm: Normal rate and regular rhythm.     Pulses: Normal pulses.     Heart sounds: Normal heart sounds. No murmur heard. Pulmonary:  Effort: Pulmonary effort is normal. No respiratory distress.     Breath sounds: Normal breath sounds. No wheezing, rhonchi or rales.  Musculoskeletal:     Right lower leg: No edema.     Left lower leg: No edema.  Skin:    General: Skin is warm and dry.     Findings: No rash.  Neurological:     Mental Status: She is alert.  Psychiatric:        Mood and Affect: Mood normal.        Behavior: Behavior normal.       Lab Results  Component Value Date   NA 140 01/31/2024   CL 105 01/31/2024   K 4.1 01/31/2024   CO2 25 01/31/2024   BUN 18 01/31/2024   CREATININE 0.90 01/31/2024   GFR 63.93 01/31/2024   CALCIUM 9.3 01/31/2024   PHOS 3.5 01/31/2024   ALBUMIN 4.2 01/31/2024   GLUCOSE 87  01/31/2024     Assessment & Plan:   Problem List Items Addressed This Visit     Chronic insomnia   Ongoing difficulty.  Tried and failed multiple meds in the past. Will trial silenor  3mg  nightly. Consider CBT-I.  Reassess at f/u visit.       Elevated blood pressure reading in office without diagnosis of hypertension - Primary   Persistent elevation again noted in office - however home readings largely well controlled. ?component of white coat hypertension. I did ask her to bring in home BP cuff to next visit for comparison.  Again reviewed diet and lifestyle choices to keep blood pressure under control.  RTC 3 mo HTN recheck.  DASH diet previously provided.  She did not try amlodipine  - will stay off at this time.         Meds ordered this encounter  Medications   Doxepin  HCl (SILENOR ) 3 MG TABS    Sig: Take 1 tablet (3 mg total) by mouth at bedtime.    Dispense:  30 tablet    Refill:  1    No orders of the defined types were placed in this encounter.   Patient Instructions  Blood pressures at home are running well controlled - continue low salt/sodium diet to keep good control. Watch anti inflammatory medicines.  For cough - try pepcid 20mg  at night time or a short course of Prilosec -OTC. Limit spicy foods, caffeine.  For sleep - trial silenor  3mg  nightly for insomnia.  Bring in home BP cuff to next visit.  Return in 3 months for BP and sleep follow up  Follow up plan: Return in about 3 months (around 06/12/2024), or if symptoms worsen or fail to improve, for follow up visit.  Anton Blas, MD

## 2024-03-14 ENCOUNTER — Telehealth: Payer: Self-pay

## 2024-03-14 ENCOUNTER — Other Ambulatory Visit (HOSPITAL_COMMUNITY): Payer: Self-pay

## 2024-03-14 NOTE — Telephone Encounter (Signed)
 PA request has been Submitted. New Encounter has been or will be created for follow up. For additional info see Pharmacy Prior Auth telephone encounter from 03/14/24.

## 2024-03-14 NOTE — Telephone Encounter (Signed)
 Copied from CRM #[id number]. Topic: Clinical - Medication Prior Auth >> Mar 14, 2024 11:37 AM Henretta I wrote: Reason for CRM: Patient went to pick up medication Doxepin  HCl (SILENOR ) 3 MG TABS but pharmacy stated that insurance needs a prior authorization from insurance was needed for this prescription.

## 2024-03-14 NOTE — Telephone Encounter (Signed)
 Pharmacy Patient Advocate Encounter   Received notification from Pt Calls Messages that prior authorization for Doxepin  HCl 3MG  tablets is required/requested.   Insurance verification completed.   The patient is insured through [facility] ADVANTAGE/RX ADVANCE .   Per test claim: PA required; PA submitted to above mentioned insurance via Latent Key/confirmation #/EOC [id number] Status is pending

## 2024-03-15 ENCOUNTER — Other Ambulatory Visit (HOSPITAL_COMMUNITY): Payer: Self-pay

## 2024-03-15 NOTE — Telephone Encounter (Signed)
 Pharmacy Patient Advocate Encounter  Received notification from [facility] ADVANTAGE/RX ADVANCE that Prior Authorization for Doxepin  HCl 3MG  tablets has been APPROVED from 03/14/24 to 07/05/24. Ran test claim, Copay is $100. This test claim was processed through [facility]- copay amounts may vary at other pharmacies due to pharmacy/plan contracts, or as the patient moves through the different stages of their insurance plan.   PA #/Case ID/Reference #: [id number]

## 2024-04-13 ENCOUNTER — Ambulatory Visit: Payer: Self-pay

## 2024-04-13 NOTE — Telephone Encounter (Signed)
 FYI Only or Action Required?: FYI only for provider.  Patient was last seen in primary care on 03/13/2024 by Rilla Baller, MD.  Called Nurse Triage reporting Breast Pain Pt has fibroids in that area.   Symptoms began several weeks ago.  Interventions attempted: Nothing.  Symptoms are: gradually worsening.  Triage Disposition: See PCP When Office is Open (Within 3 Days)  Patient/caregiver understands and will follow disposition?: Yes                    Copied from CRM #[id number]. Topic: Clinical - Red Word Triage >> Apr 13, 2024  9:44 AM Harlene ORN wrote: Red Word that prompted transfer to Nurse Triage: pain in her right breast where they did her ultrasound. Started three weeks ago after she did her ultrasound in May. Reason for Disposition  Breast lump  Answer Assessment - Initial Assessment Questions 1. SYMPTOM: What's the main symptom you're concerned about?  (e.g., lump, nipple discharge, pain, rash)     Pain under right arm and going under right breast to the right side- also a lump in that area 2. LOCATION: Where is the Pain located?     Under right arm and up under breast 3. ONSET: When did pain  start?     3 weeks ago 4. PRIOR HISTORY: Do you have any history of prior problems with your breasts? (e.g., breast cancer, breast implant, fibrocystic breast disease)     Yes 5. CAUSE: What do you think is causing this symptom?     Fibroid - unsure 6. OTHER SYMPTOMS: Do you have any other symptoms? (e.g., breast pain, fever, nipple discharge, redness or rash)     no  Protocols used: Breast Symptoms-A-AH

## 2024-04-13 NOTE — Telephone Encounter (Signed)
 Appointment with Dr. Avelina on 04/14/24.

## 2024-04-14 ENCOUNTER — Ambulatory Visit: Admitting: Family Medicine

## 2024-04-14 NOTE — Telephone Encounter (Addendum)
 Looks like appt was cancelled. Would again offer OV for evaluation unless symptoms have resolved.

## 2024-04-18 NOTE — Telephone Encounter (Signed)
 Left message to return call to our office.

## 2024-04-19 NOTE — Telephone Encounter (Signed)
 Spoke with pt asking about sxs and c/x OV. States sxs had improved but was actually going to call back today since pain had returned. Offered OV with Dr KANDICE but pt requested female provider (considering the issue) and wants to see Dr Avelina. Scheduled OV tomorrow at 9:20 w/ Dr Avelina. Fyi to Dr KANDICE.

## 2024-04-19 NOTE — Telephone Encounter (Signed)
 Noted. Thanks. Appreciate Dr Avelina seeing patient.

## 2024-04-20 ENCOUNTER — Ambulatory Visit (INDEPENDENT_AMBULATORY_CARE_PROVIDER_SITE_OTHER): Admitting: Family Medicine

## 2024-04-20 ENCOUNTER — Encounter: Payer: Self-pay | Admitting: Family Medicine

## 2024-04-20 VITALS — BP 130/82 | HR 73 | Temp 99.0°F | Ht 65.0 in | Wt 158.5 lb

## 2024-04-20 DIAGNOSIS — N644 Mastodynia: Secondary | ICD-10-CM | POA: Insufficient documentation

## 2024-04-20 DIAGNOSIS — Z807 Family history of other malignant neoplasms of lymphoid, hematopoietic and related tissues: Secondary | ICD-10-CM | POA: Diagnosis not present

## 2024-04-20 NOTE — Assessment & Plan Note (Signed)
 CBC within normal limits July 2025.  Patient without night sweats, unexplained and with weight loss and feels well overall.  No flulike symptoms. No lymphadenopathy noted on exam except for possible lymph node right upper outer breast.

## 2024-04-20 NOTE — Progress Notes (Signed)
 Patient ID: Shannon Kelly, female    DOB: 10/19/1950, 73 y.o.   MRN: [id number]  This visit was conducted in person.  BP 130/82   Pulse 73   Temp 99 F (37.2 C) (Oral)   Ht 5' 5 (1.651 m)   Wt 158 lb 8 oz (71.9 kg)   SpO2 97%   BMI 26.38 kg/m    CC:  Chief Complaint  Patient presents with   Breast Pain    Pt here for R Breast pain and under the R arm.  No home treatment.    Subjective:   HPI: Shannon Kelly is a 73 y.o. female presenting on 04/20/2024 for Breast Pain (Pt here for R Breast pain and under the R arm. /No home treatment.)   Patient reports earlier in the year 11/25/2023 noted lump Diagnostic mammogram and ultrasound:  1. No findings suspicious for RIGHT or LEFT breast malignancy. 2. Targeted mammographic and sonographic evaluation of the palpable region in the upper outer RIGHT breast demonstrates a nonenlarged benign lymph node. No suspicious abnormality identified in this quadrant of the RIGHT breast.       Now in last month she has noted some pain in right  upper outer breast.. intermittent pain with moving arm in a certain way., sharp, last seconds, multiple tome.  Lifting mug can cause it.  Still feels  the previously noted lymph nodes.  No redness, no skin change.  No nipple changes.    She uses her upper body a lot with moving boxes. Doing upper body weights.   No past breast cancer.  Niece and aunts  on family in both sides  with breast cancer.  Mother with Nonhodkin's lymphoma   Drinks 1/2 cup coffee each day. No new exposure.  Ha had Td and flu on 10/10... was already having issues.   Relevant past medical, surgical, family and social history reviewed and updated as indicated. Interim medical history since our last visit reviewed. Allergies and medications reviewed and updated. Outpatient Medications Prior to Visit  Medication Sig Dispense Refill   Ibuprofen-diphenhydrAMINE Cit (ADVIL PM PO) Take by mouth.     celecoxib  (CELEBREX )  200 MG capsule Take 1 capsule (200 mg total) by mouth daily. 90 capsule 1   Cholecalciferol (VITAMIN D3) 25 MCG (1000 UT) CAPS Take 1 capsule (1,000 Units total) by mouth daily. 60 capsule    Doxepin  HCl (SILENOR ) 3 MG TABS Take 1 tablet (3 mg total) by mouth at bedtime. 30 tablet 1   hydrocortisone 2.5 % ointment Apply topically.     MISC NATURAL PRODUCTS PO Take by mouth at bedtime. Delta-8     No facility-administered medications prior to visit.     Per HPI unless specifically indicated in ROS section below Review of Systems  Constitutional:  Negative for fatigue and fever.  HENT:  Negative for congestion.   Eyes:  Negative for pain.  Respiratory:  Negative for cough and shortness of breath.   Cardiovascular:  Negative for chest pain, palpitations and leg swelling.  Gastrointestinal:  Negative for abdominal pain.  Genitourinary:  Negative for dysuria and vaginal bleeding.  Musculoskeletal:  Negative for back pain.  Neurological:  Negative for syncope, light-headedness and headaches.  Psychiatric/Behavioral:  Negative for dysphoric mood.    Objective:  BP 130/82   Pulse 73   Temp 99 F (37.2 C) (Oral)   Ht 5' 5 (1.651 m)   Wt 158 lb 8 oz (71.9 kg)   SpO2  97%   BMI 26.38 kg/m   Wt Readings from Last 3 Encounters:  04/20/24 158 lb 8 oz (71.9 kg)  03/13/24 169 lb 6 oz (76.8 kg)  02/07/24 167 lb (75.8 kg)      Physical Exam Constitutional:      General: She is not in acute distress.    Appearance: Normal appearance. She is well-developed. She is not ill-appearing or toxic-appearing.  HENT:     Head: Normocephalic.     Right Ear: Hearing, tympanic membrane, ear canal and external ear normal. Tympanic membrane is not erythematous, retracted or bulging.     Left Ear: Hearing, tympanic membrane, ear canal and external ear normal. Tympanic membrane is not erythematous, retracted or bulging.     Nose: No mucosal edema or rhinorrhea.     Right Sinus: No maxillary sinus  tenderness or frontal sinus tenderness.     Left Sinus: No maxillary sinus tenderness or frontal sinus tenderness.     Mouth/Throat:     Pharynx: Uvula midline.  Eyes:     General: Lids are normal. Lids are everted, no foreign bodies appreciated.     Conjunctiva/sclera: Conjunctivae normal.     Pupils: Pupils are equal, round, and reactive to light.  Neck:     Thyroid : No thyroid  mass or thyromegaly.     Vascular: No carotid bruit.     Trachea: Trachea normal.  Cardiovascular:     Rate and Rhythm: Normal rate and regular rhythm.     Pulses: Normal pulses.     Heart sounds: Normal heart sounds, S1 normal and S2 normal. No murmur heard.    No friction rub. No gallop.  Pulmonary:     Effort: Pulmonary effort is normal. No tachypnea or respiratory distress.     Breath sounds: Normal breath sounds. No decreased breath sounds, wheezing, rhonchi or rales.  Chest:     Chest wall: No mass, swelling, tenderness, crepitus or edema.  Breasts:    Breasts are symmetrical.     Right: Mass present. No swelling, bleeding, inverted nipple, nipple discharge, skin change or tenderness.     Left: No swelling, bleeding, inverted nipple, mass, nipple discharge, skin change or tenderness.     Comments: No tenderness to palpation of chest wall... only noted with movement of right arm Abdominal:     General: Bowel sounds are normal.     Palpations: Abdomen is soft.     Tenderness: There is no abdominal tenderness.  Musculoskeletal:     Cervical back: Normal range of motion and neck supple.  Lymphadenopathy:     Head:     Right side of head: No submental, submandibular, tonsillar, preauricular, posterior auricular or occipital adenopathy.     Left side of head: No submental, submandibular, tonsillar, preauricular, posterior auricular or occipital adenopathy.     Cervical: No cervical adenopathy.     Upper Body:     Right upper body: No supraclavicular, axillary or pectoral adenopathy.     Left upper  body: No supraclavicular, axillary or pectoral adenopathy.     Lower Body: No right inguinal adenopathy. No left inguinal adenopathy.  Skin:    General: Skin is warm and dry.     Findings: No rash.  Neurological:     Mental Status: She is alert.  Psychiatric:        Mood and Affect: Mood is not anxious or depressed.        Speech: Speech normal.  Behavior: Behavior normal. Behavior is cooperative.        Thought Content: Thought content normal.        Judgment: Judgment normal.       Results for orders placed or performed in visit on 01/31/24  CBC with Differential/Platelet   Collection Time: 01/31/24  7:46 AM  Result Value Ref Range   WBC 5.3 4.0 - 10.5 K/uL   RBC 4.20 3.87 - 5.11 Mil/uL   Hemoglobin 12.7 12.0 - 15.0 g/dL   HCT 61.8 63.9 - 53.9 %   MCV 90.6 78.0 - 100.0 fl   MCHC 33.3 30.0 - 36.0 g/dL   RDW 86.2 88.4 - 84.4 %   Platelets 301.0 150.0 - 400.0 K/uL   Neutrophils Relative % 55.9 43.0 - 77.0 %   Lymphocytes Relative 31.6 12.0 - 46.0 %   Monocytes Relative 8.6 3.0 - 12.0 %   Eosinophils Relative 3.3 0.0 - 5.0 %   Basophils Relative 0.6 0.0 - 3.0 %   Neutro Abs 3.0 1.4 - 7.7 K/uL   Lymphs Abs 1.7 0.7 - 4.0 K/uL   Monocytes Absolute 0.5 0.1 - 1.0 K/uL   Eosinophils Absolute 0.2 0.0 - 0.7 K/uL   Basophils Absolute 0.0 0.0 - 0.1 K/uL  Parathyroid  hormone, intact (no Ca)   Collection Time: 01/31/24  7:46 AM  Result Value Ref Range   PTH 14 (L) 16 - 77 pg/mL  Microalbumin / creatinine urine ratio   Collection Time: 01/31/24  7:46 AM  Result Value Ref Range   Microalb, Ur <0.7 mg/dL   Creatinine,U 13.4 mg/dL   Microalb Creat Ratio Unable to calculate 0.0 - 30.0 mg/g  VITAMIN D  25 Hydroxy (Vit-D Deficiency, Fractures)   Collection Time: 01/31/24  7:46 AM  Result Value Ref Range   VITD 34.00 30.00 - 100.00 ng/mL  Phosphorus   Collection Time: 01/31/24  7:46 AM  Result Value Ref Range   Phosphorus 3.5 2.3 - 4.6 mg/dL  Comprehensive metabolic panel with  GFR   Collection Time: 01/31/24  7:46 AM  Result Value Ref Range   Sodium 140 135 - 145 mEq/L   Potassium 4.1 3.5 - 5.1 mEq/L   Chloride 105 96 - 112 mEq/L   CO2 25 19 - 32 mEq/L   Glucose, Bld 87 70 - 99 mg/dL   BUN 18 6 - 23 mg/dL   Creatinine, Ser 9.09 0.40 - 1.20 mg/dL   Total Bilirubin 0.6 0.2 - 1.2 mg/dL   Alkaline Phosphatase 62 39 - 117 U/L   AST 15 0 - 37 U/L   ALT 16 0 - 35 U/L   Total Protein 7.1 6.0 - 8.3 g/dL   Albumin 4.2 3.5 - 5.2 g/dL   GFR 36.06 >39.99 mL/min   Calcium 9.3 8.4 - 10.5 mg/dL  Lipid panel   Collection Time: 01/31/24  7:46 AM  Result Value Ref Range   Cholesterol 202 (H) 0 - 200 mg/dL   Triglycerides 860.9 0.0 - 149.0 mg/dL   HDL 49.79 >60.99 mg/dL   VLDL 72.1 0.0 - 59.9 mg/dL   LDL Cholesterol 875 (H) 0 - 99 mg/dL   Total CHOL/HDL Ratio 4    NonHDL 151.69     Assessment and Plan  Breast pain, right Assessment & Plan:  Acute, nontender on breast exam but patient with continued mass approximately 3:00 found to be lymph node on May 2025 ultrasound/mammogram. Most likely symptoms are secondary to musculoskeletal irritation versus referred pain from upper neck and  thoracic spine. Encouraged her to hold off on chiropractor for now.  Recommended heat and continued Celebrex .  Given mass in right breast I will have her move forward with reevaluation with ultrasound right breast given new symptom of breast pain to evaluate to see if lymph node is larger.  No lymph nodes noted in axilla Diagnostic mammogram could be repeated per breast center's recommendations.  Orders: -     US  LIMITED ULTRASOUND INCLUDING AXILLA RIGHT BREAST; Future -     US  LIMITED ULTRASOUND INCLUDING AXILLA LEFT BREAST ; Future -     MM 3D DIAGNOSTIC MAMMOGRAM BILATERAL BREAST; Future  Family history of lymphoma Assessment & Plan: CBC within normal limits July 2025.  Patient without night sweats, unexplained and with weight loss and feels well overall.  No flulike symptoms. No  lymphadenopathy noted on exam except for possible lymph node right upper outer breast.     No follow-ups on file.   Greig Ring, MD

## 2024-04-20 NOTE — Assessment & Plan Note (Signed)
 Acute, nontender on breast exam but patient with continued mass approximately 3:00 found to be lymph node on May 2025 ultrasound/mammogram. Most likely symptoms are secondary to musculoskeletal irritation versus referred pain from upper neck and thoracic spine. Encouraged her to hold off on chiropractor for now.  Recommended heat and continued Celebrex .  Given mass in right breast I will have her move forward with reevaluation with ultrasound right breast given new symptom of breast pain to evaluate to see if lymph node is larger.  No lymph nodes noted in axilla Diagnostic mammogram could be repeated per breast center's recommendations.

## 2024-04-21 ENCOUNTER — Emergency Department
Admission: EM | Admit: 2024-04-21 | Discharge: 2024-04-21 | Disposition: A | Discharge status: Home / Self Care | Attending: Emergency Medicine | Admitting: Emergency Medicine

## 2024-04-21 ENCOUNTER — Ambulatory Visit: Payer: Self-pay

## 2024-04-21 ENCOUNTER — Emergency Department

## 2024-04-21 ENCOUNTER — Other Ambulatory Visit: Payer: Self-pay

## 2024-04-21 DIAGNOSIS — R42 Dizziness and giddiness: Secondary | ICD-10-CM | POA: Diagnosis not present

## 2024-04-21 DIAGNOSIS — E860 Dehydration: Secondary | ICD-10-CM | POA: Diagnosis not present

## 2024-04-21 DIAGNOSIS — H8110 Benign paroxysmal vertigo, unspecified ear: Secondary | ICD-10-CM | POA: Diagnosis not present

## 2024-04-21 DIAGNOSIS — R110 Nausea: Secondary | ICD-10-CM | POA: Diagnosis not present

## 2024-04-21 DIAGNOSIS — R8290 Unspecified abnormal findings in urine: Secondary | ICD-10-CM | POA: Insufficient documentation

## 2024-04-21 LAB — URINALYSIS, ROUTINE W REFLEX MICROSCOPIC
Bilirubin Urine: NEGATIVE
Glucose, UA: NEGATIVE mg/dL
Ketones, ur: 20 mg/dL — AB
Nitrite: NEGATIVE
Protein, ur: NEGATIVE mg/dL
Specific Gravity, Urine: 1.015 (ref 1.005–1.030)
pH: 5 (ref 5.0–8.0)

## 2024-04-21 LAB — COMPREHENSIVE METABOLIC PANEL WITH GFR
ALT: 17 U/L (ref 0–44)
AST: 19 U/L (ref 15–41)
Albumin: 4.1 g/dL (ref 3.5–5.0)
Alkaline Phosphatase: 57 U/L (ref 38–126)
Anion gap: 11 (ref 5–15)
BUN: 29 mg/dL — ABNORMAL HIGH (ref 8–23)
CO2: 24 mmol/L (ref 22–32)
Calcium: 9.7 mg/dL (ref 8.9–10.3)
Chloride: 101 mmol/L (ref 98–111)
Creatinine, Ser: 0.77 mg/dL (ref 0.44–1.00)
GFR, Estimated: >60 mL/min (ref >60)
Glucose, Bld: 108 mg/dL — ABNORMAL HIGH (ref 70–99)
Potassium: 4.4 mmol/L (ref 3.5–5.1)
Sodium: 136 mmol/L (ref 135–145)
Total Bilirubin: 0.8 mg/dL (ref 0.0–1.2)
Total Protein: 7.7 g/dL (ref 6.5–8.1)

## 2024-04-21 LAB — CBC
HCT: 38 % (ref 36.0–46.0)
Hemoglobin: 12.7 g/dL (ref 12.0–15.0)
MCH: 30.2 pg (ref 26.0–34.0)
MCHC: 33.4 g/dL (ref 30.0–36.0)
MCV: 90.5 fL (ref 80.0–100.0)
Platelets: 299 K/uL (ref 150–400)
RBC: 4.2 MIL/uL (ref 3.87–5.11)
RDW: 13.3 % (ref 11.5–15.5)
WBC: 7.5 K/uL (ref 4.0–10.5)
nRBC: 0 % (ref 0.0–0.2)

## 2024-04-21 MED ORDER — ACETAMINOPHEN 500 MG PO TABS
1000.0000 mg | ORAL_TABLET | Freq: Once | ORAL | Status: AC
  Administered 2024-04-21: 1000 mg via ORAL
  Filled 2024-04-21: qty 2

## 2024-04-21 MED ORDER — ONDANSETRON 4 MG PO TBDP: 4.0000 mg | ORAL_TABLET | Freq: Three times a day (TID) | ORAL | 0 refills | Status: AC | PRN

## 2024-04-21 MED ORDER — MECLIZINE HCL 25 MG PO TABS: 25.0000 mg | ORAL_TABLET | Freq: Three times a day (TID) | ORAL | 0 refills | Status: AC | PRN

## 2024-04-21 MED ORDER — MECLIZINE HCL 25 MG PO TABS
25.0000 mg | ORAL_TABLET | Freq: Once | ORAL | Status: AC
  Administered 2024-04-21: 25 mg via ORAL
  Filled 2024-04-21: qty 1

## 2024-04-21 MED ORDER — ONDANSETRON 4 MG PO TBDP
4.0000 mg | ORAL_TABLET | Freq: Once | ORAL | Status: AC
  Administered 2024-04-21: 4 mg via ORAL
  Filled 2024-04-21: qty 1

## 2024-04-21 NOTE — ED Triage Notes (Signed)
 Patient to ED via [facility] for dizziness that started around 1100; also complaining of nausea. States she feels like the room is spinning.

## 2024-04-21 NOTE — Telephone Encounter (Signed)
 FYI Only or Action Required?: FYI only for provider.  Patient was last seen in primary care on 04/20/2024 by Shannon Greig BRAVO, MD.  Called Nurse Triage reporting Dizziness.  Symptoms began today.  Interventions attempted: Rest, hydration, or home remedies.  Symptoms are: unchanged.  Triage Disposition: Go to ED Now (Notify PCP)  Patient/caregiver understands and will follow disposition?: Yes  Copied from CRM #[id number]. Topic: Clinical - Red Word Triage >> Apr 21, 2024  1:34 PM Anairis L wrote: [facility] that prompted transfer to Nurse Triage: Dizziness can't even sit up, Seen Amy Bedsole yesterday. Reason for Disposition  SEVERE dizziness (vertigo) (e.g., unable to walk without assistance)  Answer Assessment - Initial Assessment Questions 1. DESCRIPTION: Describe your dizziness.     Very dizzy unless laying flat 2. VERTIGO: Do you feel like either you or the room is spinning or tilting?      Positive for spinning and tilting 3. LIGHTHEADED: Do you feel lightheaded? (e.g., somewhat faint, woozy, weak upon standing)     denies 4. SEVERITY: How bad is it?  Can you walk?     Cannot walk 5. ONSET:  When did the dizziness begin?     Two hours ago, today 6. AGGRAVATING FACTORS: Does anything make it worse? (e.g., standing, change in head position)     Change in head position, standing 7. CAUSE: What do you think is causing the dizziness?     unsure 8. RECURRENT SYMPTOM: Have you had dizziness before? If Yes, ask: When was the last time? What happened that time?     denies 9. OTHER SYMPTOMS: Do you have any other symptoms? (e.g., earache, headache, numbness, tinnitus, vomiting, weakness)     Nausea with morvement 10. PREGNANCY: Is there any chance you are pregnant? When was your last menstrual period?       N/a  Protocols used: Dizziness - Vertigo-A-AH

## 2024-04-21 NOTE — ED Triage Notes (Signed)
 First nurse note: pt to ED [facility] from home for dizziness started this am, nauseous with movement. Light sensitivity. VSS with EMS

## 2024-04-21 NOTE — ED Provider Notes (Signed)
 [facility] Provider Note    Event Date/Time   First MD Initiated Contact with Patient 04/21/24 1735     (approximate)   History   Dizziness   HPI  Shannon Kelly is a 73 y.o. female who presents to the ED for evaluation of Dizziness   I reviewed PCP visit from yesterday. Minimal medical history.  Patient presents to the ED with her husband for evaluation of a resolving episode of vertiginous dizziness that started earlier today.  Positional vertigo, improved with staying still, had 1 episode of emesis here in the ED.  No abdominal pain or syncope.  No recent head trauma or injuries.  By the time I see her she reports feeling much better but has never felt like this before.  Alongside the dizziness, no other symptoms such as weakness to the extremities, voice changes, vision changes, sensation changes  Reports a busy day today and poor intake without any fluids.  No urinary symptoms such as dysuria, hematuria, frequency   Physical Exam   Triage Vital Signs: ED Triage Vitals  Encounter Vitals Group     BP 04/21/24 1544 (!) 165/76     Girls Systolic BP Percentile --      Girls Diastolic BP Percentile --      Boys Systolic BP Percentile --      Boys Diastolic BP Percentile --      Pulse Rate 04/21/24 1544 82     Resp 04/21/24 1544 18     Temp 04/21/24 1544 97.9 F (36.6 C)     Temp Source 04/21/24 1544 Oral     SpO2 04/21/24 1544 95 %     Weight 04/21/24 1543 158 lb (71.7 kg)     Height 04/21/24 1543 5' 5 (1.651 m)     Head Circumference --      Peak Flow --      Pain Score 04/21/24 1542 0     Pain Loc --      Pain Education --      Exclude from Growth Chart --     Most recent vital signs: Vitals:   04/21/24 1544  BP: (!) 165/76  Pulse: 82  Resp: 18  Temp: 97.9 F (36.6 C)  SpO2: 95%    General: Awake, no distress.  CV:  Good peripheral perfusion.  Resp:  Normal effort.  Abd:  No distention.  MSK:  No deformity noted.  Full  range of motion, soft and nontender Neuro:  No focal deficits appreciated. Cranial nerves II through XII intact 5/5 strength and sensation in all 4 extremities Other:  Bilateral TMs clear   ED Results / Procedures / Treatments   Labs (all labs ordered are listed, but only abnormal results are displayed) Labs Reviewed  COMPREHENSIVE METABOLIC PANEL WITH GFR - Abnormal; Notable for the following components:      Result Value   Glucose, Bld 108 (*)    BUN 29 (*)    All other components within normal limits  URINALYSIS, ROUTINE W REFLEX MICROSCOPIC - Abnormal; Notable for the following components:   Color, Urine YELLOW (*)    APPearance HAZY (*)    Hgb urine dipstick SMALL (*)    Ketones, ur 20 (*)    Leukocytes,Ua SMALL (*)    Bacteria, UA RARE (*)    Non Squamous Epithelial PRESENT (*)    All other components within normal limits  URINE CULTURE  CBC  CBG MONITORING, ED  EKG Sinus rhythm rate of 73 bpm.  Normal axis and intervals without clear signs of acute ischemia.  RADIOLOGY CT head interpreted by me without evidence of acute intracranial pathology  Official radiology report(s): CT Head Wo Contrast Result Date: 04/21/2024 EXAM: CT HEAD WITHOUT CONTRAST 04/21/2024 04:44:17 PM TECHNIQUE: CT of the head was performed without the administration of intravenous contrast. Automated exposure control, iterative reconstruction, and/or weight based adjustment of the mA/kV was utilized to reduce the radiation dose to as low as reasonably achievable. COMPARISON: None available. CLINICAL HISTORY: dizziness and light sensitivity. Patient to ED via [facility] for dizziness that started around 1100; also complaining of nausea. States she feels like the room is spinning. FINDINGS: BRAIN AND VENTRICLES: No acute hemorrhage. No evidence of acute infarct. No hydrocephalus. No extra-axial collection. No mass effect or midline shift. ORBITS: Bilateral lens replacement. No acute abnormality. SINUSES: No  acute abnormality. SOFT TISSUES AND SKULL: No acute soft tissue abnormality. No skull fracture. IMPRESSION: 1. No acute intracranial abnormality. Electronically signed by: Donnice Mania MD 04/21/2024 05:38 PM EDT RP Workstation: [id number]    PROCEDURES and INTERVENTIONS:  Procedures  Medications  acetaminophen  (TYLENOL ) tablet 1,000 mg (1,000 mg Oral Given 04/21/24 1813)  ondansetron (ZOFRAN-ODT) disintegrating tablet 4 mg (4 mg Oral Given 04/21/24 1812)  meclizine (ANTIVERT) tablet 25 mg (25 mg Oral Given 04/21/24 1813)     IMPRESSION / MDM / ASSESSMENT AND PLAN / ED COURSE  I reviewed the triage vital signs and the nursing notes.  Differential diagnosis includes, but is not limited to, BPPV, central vertigo, dehydration, symptomatic anemia  {Patient presents with symptoms of an acute illness or injury that is potentially life-threatening.  Patient presents with positional intermittent vertigo of likely BPPV etiology.  Improving symptoms by time I see her.  Normal neurologic exam without deficits.  Normal blood work with CBC and metabolic panel.  Urine with ketones, small leukocytes and non-squamous epithelial cells.  She adamantly denies any urinary symptoms and we decided to send her urine for culture and not provide antibiotics at this time.  Will provide Tylenol , Zofran and meclizine, reassess with anticipation of outpatient management and ENT follow-up.  Clinical Course as of 04/21/24 1902  Fri Apr 21, 2024  1900 Reassessed, symptoms have resolved, she gets up without my assistance and ambulates with normal gait without symptoms.  She is appreciative.  We discussed medications at home, ENT follow-up and return precautions for the ED. [DS]    Clinical Course User Index [DS] Claudene Rover, MD     FINAL CLINICAL IMPRESSION(S) / ED DIAGNOSES   Final diagnoses:  Dizziness  Vertigo  Dehydration     Rx / DC Orders   ED Discharge Orders          Ordered    meclizine  (ANTIVERT) 25 MG tablet  3 times daily PRN        04/21/24 1901    ondansetron (ZOFRAN-ODT) 4 MG disintegrating tablet  Every 8 hours PRN        04/21/24 1901             Note:  This document was prepared using Dragon voice recognition software and may include unintentional dictation errors.   Claudene Rover, MD 04/21/24 RETHA

## 2024-04-21 NOTE — Discharge Instructions (Addendum)
 Use Tylenol  for pain and fevers.  Up to 1000 mg per dose, up to 4 times per day.  Do not take more than 4000 mg of Tylenol /acetaminophen  within 24 hours..  Meclizine as needed for any further dizziness or vertigo spells  Zofran as needed for any further nausea or vomiting  If you have any additional spells of this vertigo I would reach out to ENT to see them in the clinic for follow-up  If you have any worsening symptoms, strokelike symptoms or other concerns and please return to the ED

## 2024-04-22 LAB — URINE CULTURE: Culture: <10000 — AB

## 2024-04-24 NOTE — Telephone Encounter (Signed)
 Patient at ED

## 2024-04-24 NOTE — Telephone Encounter (Signed)
 She was seen Thursday at ER for vertigo episode thought BPPV that did improve prior to going home.  Ensure she's feeling well, to let us  know if any recurrent episode for evaluation.

## 2024-04-25 NOTE — Telephone Encounter (Signed)
 Left message to return call to our office.

## 2024-04-27 NOTE — Telephone Encounter (Signed)
 Called patient is feeling much better will reach out if any changes.

## 2024-05-12 ENCOUNTER — Ambulatory Visit
Admission: RE | Admit: 2024-05-12 | Discharge: 2024-05-12 | Disposition: A | Discharge status: Home / Self Care | Source: Ambulatory Visit | Attending: Family Medicine | Admitting: Family Medicine

## 2024-05-12 ENCOUNTER — Ambulatory Visit: Payer: Self-pay | Admitting: Family Medicine

## 2024-05-12 DIAGNOSIS — N644 Mastodynia: Secondary | ICD-10-CM

## 2024-05-12 DIAGNOSIS — R928 Other abnormal and inconclusive findings on diagnostic imaging of breast: Secondary | ICD-10-CM | POA: Diagnosis not present

## 2024-06-12 ENCOUNTER — Ambulatory Visit (INDEPENDENT_AMBULATORY_CARE_PROVIDER_SITE_OTHER): Admitting: Family Medicine

## 2024-06-12 ENCOUNTER — Encounter: Payer: Self-pay | Admitting: Family Medicine

## 2024-06-12 VITALS — BP 138/80 | HR 64 | Temp 98.6°F | Ht 65.0 in | Wt 164.2 lb

## 2024-06-12 DIAGNOSIS — F5104 Psychophysiologic insomnia: Secondary | ICD-10-CM

## 2024-06-12 DIAGNOSIS — R42 Dizziness and giddiness: Secondary | ICD-10-CM

## 2024-06-12 DIAGNOSIS — R030 Elevated blood-pressure reading, without diagnosis of hypertension: Secondary | ICD-10-CM | POA: Diagnosis not present

## 2024-06-12 NOTE — Assessment & Plan Note (Signed)
 Anticipate component of white coat hypertension. Home cuff verified with our office cuff and deemed accurate.  No indication for medication at this time given good home BP control.  Reviewed diet choices to maintain good BP control  DASH diet handout provided.

## 2024-06-12 NOTE — Assessment & Plan Note (Signed)
 Episode of vertigo s/p reassuring ER eval 04/2024 - presumed due to BPPV Symptoms are largely resolved She has meclizine  and zofran  PRN at home.

## 2024-06-12 NOTE — Progress Notes (Signed)
 Ph: [phone] Fax: [phone]   Patient ID: Shannon Kelly Quest, female    DOB: 1951-01-28, 73 y.o.   MRN: [id number]  This visit was conducted in person.  BP 138/80 (Cuff Size: Normal) Comment: pts device  Pulse 64 Comment: pts device  Temp 98.6 F (37 C) (Oral)   Ht 5' 5 (1.651 m)   Wt 164 lb 3.2 oz (74.5 kg)   SpO2 97%   BMI 27.32 kg/m   Elevated on repeat testing Well controlled at home   CC: 3 mo f/u visit  Subjective:   HPI: Shannon Kelly is a 74 y.o. female presenting on 06/12/2024 for Medication Management (Pt takes log at home when she remenbers, bp varies significantly,  //Fyi: pt never filled doxepin  rx bc it was $100/ no covered by insurance)   HTN - Compliant with current antihypertensive regimen of diet controlled.  Does  check blood pressures at home: see BP log readings below - overall good blood pressure control.  No low blood pressure readings or symptoms of dizziness/syncope.  Denies HA, vision changes, CP/tightness, SOB, leg swelling.      Sleep maintenance > initiation insomnia - doxepin  3mg  was approved (successful PA) but not tried due to cost. Multiple night time awakenings, she is able to fall back asleep relatively easily. She uses delta 8 gummies with benefit.   Episode of vertigo 04/2024 - seen at ER with reassuring workup including EKG and head CT. Treated with meclizine  and zofran . She did vomit x1. Did not have preceding viral illness or hearing changes. That morning she notes she had been working cleaning the shower - bent over and head tilted at a significant angle.      Relevant past medical, surgical, family and social history reviewed and updated as indicated. Interim medical history since our last visit reviewed. Allergies and medications reviewed and updated. Outpatient Medications Prior to Visit  Medication Sig Dispense Refill   celecoxib  (CELEBREX ) 200 MG capsule Take 1 capsule (200 mg total) by mouth daily. 90 capsule 1    Cholecalciferol (VITAMIN D3) 25 MCG (1000 UT) CAPS Take 1 capsule (1,000 Units total) by mouth daily. 60 capsule    hydrocortisone 2.5 % ointment Apply topically.     Ibuprofen-diphenhydrAMINE Cit (ADVIL PM PO) Take by mouth.     MISC NATURAL PRODUCTS PO Take by mouth at bedtime. Delta-8     meclizine  (ANTIVERT ) 25 MG tablet Take 1 tablet (25 mg total) by mouth 3 (three) times daily as needed. (Patient not taking: Reported on 06/12/2024) 30 tablet 0   ondansetron  (ZOFRAN -ODT) 4 MG disintegrating tablet Take 1 tablet (4 mg total) by mouth every 8 (eight) hours as needed. (Patient not taking: Reported on 06/12/2024) 20 tablet 0   Doxepin  HCl (SILENOR ) 3 MG TABS Take 1 tablet (3 mg total) by mouth at bedtime. (Patient not taking: Reported on 06/12/2024) 30 tablet 1   No facility-administered medications prior to visit.     Per HPI unless specifically indicated in ROS section below Review of Systems  Objective:  BP 138/80 (Cuff Size: Normal) Comment: pts device  Pulse 64 Comment: pts device  Temp 98.6 F (37 C) (Oral)   Ht 5' 5 (1.651 m)   Wt 164 lb 3.2 oz (74.5 kg)   SpO2 97%   BMI 27.32 kg/m   Wt Readings from Last 3 Encounters:  06/12/24 164 lb 3.2 oz (74.5 kg)  04/21/24 158 lb (71.7 kg)  04/20/24 158 lb 8 oz (  71.9 kg)      Physical Exam Vitals and nursing note reviewed.  Constitutional:      Appearance: Normal appearance. She is not ill-appearing.  HENT:     Head: Normocephalic and atraumatic.     Mouth/Throat:     Mouth: Mucous membranes are moist.     Pharynx: Oropharynx is clear. No oropharyngeal exudate or posterior oropharyngeal erythema.  Eyes:     Extraocular Movements: Extraocular movements intact.     Pupils: Pupils are equal, round, and reactive to light.  Neck:     Thyroid : No thyroid  mass or thyromegaly.  Cardiovascular:     Rate and Rhythm: Normal rate and regular rhythm.     Pulses: Normal pulses.     Heart sounds: Normal heart sounds. No murmur  heard. Pulmonary:     Effort: Pulmonary effort is normal. No respiratory distress.     Breath sounds: Normal breath sounds. No wheezing, rhonchi or rales.  Musculoskeletal:     Cervical back: Normal range of motion and neck supple.     Right lower leg: No edema.     Left lower leg: No edema.  Skin:    General: Skin is warm and dry.     Findings: No rash.  Neurological:     Mental Status: She is alert.  Psychiatric:        Mood and Affect: Mood normal.        Behavior: Behavior normal.       Results for orders placed or performed during the hospital encounter of 04/21/24  Urinalysis, Routine w reflex microscopic -Urine, Clean Catch   Collection Time: 04/21/24  3:43 PM  Result Value Ref Range   Color, Urine YELLOW (A) YELLOW   APPearance HAZY (A) CLEAR   Specific Gravity, Urine 1.015 1.005 - 1.030   pH 5.0 5.0 - 8.0   Glucose, UA NEGATIVE NEGATIVE mg/dL   Hgb urine dipstick SMALL (A) NEGATIVE   Bilirubin Urine NEGATIVE NEGATIVE   Ketones, ur 20 (A) NEGATIVE mg/dL   Protein, ur NEGATIVE NEGATIVE mg/dL   Nitrite NEGATIVE NEGATIVE   Leukocytes,Ua SMALL (A) NEGATIVE   RBC / HPF 6-10 0 - 5 RBC/hpf   WBC, UA 11-20 0 - 5 WBC/hpf   Bacteria, UA RARE (A) NONE SEEN   Squamous Epithelial / HPF 6-10 0 - 5 /HPF   Mucus PRESENT    Non Squamous Epithelial PRESENT (A) NONE SEEN  Comprehensive metabolic panel   Collection Time: 04/21/24  3:45 PM  Result Value Ref Range   Sodium 136 135 - 145 mmol/L   Potassium 4.4 3.5 - 5.1 mmol/L   Chloride 101 98 - 111 mmol/L   CO2 24 22 - 32 mmol/L   Glucose, Bld 108 (H) 70 - 99 mg/dL   BUN 29 (H) 8 - 23 mg/dL   Creatinine, Ser 9.22 0.44 - 1.00 mg/dL   Calcium 9.7 8.9 - 89.6 mg/dL   Total Protein 7.7 6.5 - 8.1 g/dL   Albumin 4.1 3.5 - 5.0 g/dL   AST 19 15 - 41 U/L   ALT 17 0 - 44 U/L   Alkaline Phosphatase 57 38 - 126 U/L   Total Bilirubin 0.8 0.0 - 1.2 mg/dL   GFR, Estimated >39 >39 mL/min   Anion gap 11 5 - 15  CBC   Collection Time:  04/21/24  3:45 PM  Result Value Ref Range   WBC 7.5 4.0 - 10.5 K/uL   RBC 4.20 3.87 - 5.11  MIL/uL   Hemoglobin 12.7 12.0 - 15.0 g/dL   HCT 61.9 63.9 - 53.9 %   MCV 90.5 80.0 - 100.0 fL   MCH 30.2 26.0 - 34.0 pg   MCHC 33.4 30.0 - 36.0 g/dL   RDW 86.6 88.4 - 84.4 %   Platelets 299 150 - 400 K/uL   nRBC 0.0 0.0 - 0.2 %  Urine Culture   Collection Time: 04/21/24  5:33 PM   Specimen: Urine, Clean Catch  Result Value Ref Range   Specimen Description      URINE, CLEAN CATCH Performed at [facility], [address] [place], [place] [address]    Special Requests      NONE Performed at [facility], [address], [place], [place] [address]    Culture (A)     <10,000 COLONIES/mL INSIGNIFICANT GROWTH Performed at [facility], [address] [place], [place] [address]    Report Status 04/22/2024 FINAL     Assessment & Plan:   Problem List Items Addressed This Visit     Chronic insomnia   Doxepin  was unaffordable.  Overall sleep is adequate.  Declines further medication at this time.       Vertigo   Episode of vertigo s/p reassuring ER eval 04/2024 - presumed due to BPPV Symptoms are largely resolved She has meclizine  and zofran  PRN at home.       Elevated blood pressure reading in office without diagnosis of hypertension - Primary   Anticipate component of white coat hypertension. Home cuff verified with our office cuff and deemed accurate.  No indication for medication at this time given good home BP control.  Reviewed diet choices to maintain good BP control  DASH diet handout provided.         No orders of the defined types were placed in this encounter.   No orders of the defined types were placed in this encounter.   Patient Instructions  Blood pressures are overall doing better - based on home log you bring in. Continue tracking intermittently, or if you feel bad check blood pressures.  Your goal blood pressure is  <140/90. Work on low salt/sodium diet - goal <2 grams (2,000mg ) per day. Eat a diet high in fruits/vegetables and whole grains.  Look into mediterranean and DASH diet. Goal activity is 171min/wk of moderate intensity exercise.  This can be split into 30 minute chunks.  If you are not at this level, you can start with smaller 10-15 min increments and slowly build up activity. Look at www.heart.org for more resources  Return after 02/06/2025 for physical/wellness visit  Follow up plan: Return in about 8 months (around 02/10/2025), or if symptoms worsen or fail to improve, for annual exam, prior fasting for blood work, medicare wellness visit.  Anton Blas, MD

## 2024-06-12 NOTE — Patient Instructions (Addendum)
 Blood pressures are overall doing better - based on home log you bring in. Continue tracking intermittently, or if you feel bad check blood pressures.  Your goal blood pressure is <140/90. Work on low salt/sodium diet - goal <2 grams (2,000mg ) per day. Eat a diet high in fruits/vegetables and whole grains.  Look into mediterranean and DASH diet. Goal activity is 126min/wk of moderate intensity exercise.  This can be split into 30 minute chunks.  If you are not at this level, you can start with smaller 10-15 min increments and slowly build up activity. Look at www.heart.org for more resources  Return after 02/06/2025 for physical/wellness visit

## 2024-06-12 NOTE — Assessment & Plan Note (Signed)
 Doxepin  was unaffordable.  Overall sleep is adequate.  Declines further medication at this time.

## 2024-07-24 ENCOUNTER — Encounter: Payer: Self-pay | Admitting: Family Medicine

## 2024-07-24 DIAGNOSIS — N8110 Cystocele, unspecified: Secondary | ICD-10-CM

## 2024-08-08 ENCOUNTER — Other Ambulatory Visit: Payer: Self-pay | Admitting: Family Medicine

## 2024-08-08 ENCOUNTER — Telehealth: Payer: Self-pay | Admitting: Family Medicine

## 2024-08-08 DIAGNOSIS — M150 Primary generalized (osteo)arthritis: Secondary | ICD-10-CM

## 2024-08-08 NOTE — Telephone Encounter (Signed)
 This is a duplicate request. Have sent pcp message in my chart with same request from patient.

## 2024-10-24 ENCOUNTER — Ambulatory Visit: Admitting: Obstetrics and Gynecology

## 2025-02-05 ENCOUNTER — Ambulatory Visit

## 2025-02-05 ENCOUNTER — Other Ambulatory Visit

## 2025-02-12 ENCOUNTER — Encounter: Admitting: Family Medicine
